# Patient Record
Sex: Male | Born: 1965 | Race: White | Hispanic: No | Marital: Married | Smoking: Never smoker
Health system: Southern US, Community
[De-identification: ages and names within clinical notes are randomized; demographics above are authoritative.]

## PROBLEM LIST (undated history)

## (undated) ENCOUNTER — Emergency Department (HOSPITAL_COMMUNITY): Payer: No Typology Code available for payment source | Source: Home / Self Care

## (undated) DIAGNOSIS — E785 Hyperlipidemia, unspecified: Secondary | ICD-10-CM

## (undated) DIAGNOSIS — I2510 Atherosclerotic heart disease of native coronary artery without angina pectoris: Secondary | ICD-10-CM

## (undated) DIAGNOSIS — Z86010 Personal history of colonic polyps: Secondary | ICD-10-CM

## (undated) DIAGNOSIS — T7840XA Allergy, unspecified, initial encounter: Secondary | ICD-10-CM

## (undated) DIAGNOSIS — K589 Irritable bowel syndrome without diarrhea: Secondary | ICD-10-CM

## (undated) DIAGNOSIS — I639 Cerebral infarction, unspecified: Secondary | ICD-10-CM

## (undated) DIAGNOSIS — Z9861 Coronary angioplasty status: Secondary | ICD-10-CM

## (undated) DIAGNOSIS — I2119 ST elevation (STEMI) myocardial infarction involving other coronary artery of inferior wall: Secondary | ICD-10-CM

## (undated) DIAGNOSIS — J302 Other seasonal allergic rhinitis: Secondary | ICD-10-CM

## (undated) DIAGNOSIS — D649 Anemia, unspecified: Secondary | ICD-10-CM

## (undated) HISTORY — PX: COLONOSCOPY: SHX174

## (undated) HISTORY — DX: Other seasonal allergic rhinitis: J30.2

## (undated) HISTORY — DX: Anemia, unspecified: D64.9

## (undated) HISTORY — DX: Cerebral infarction, unspecified: I63.9

## (undated) HISTORY — PX: COLONOSCOPY W/ POLYPECTOMY: SHX1380

## (undated) HISTORY — PX: POLYPECTOMY: SHX149

## (undated) HISTORY — DX: Personal history of colonic polyps: Z86.010

## (undated) HISTORY — DX: Irritable bowel syndrome, unspecified: K58.9

## (undated) HISTORY — DX: Hyperlipidemia, unspecified: E78.5

## (undated) HISTORY — DX: Allergy, unspecified, initial encounter: T78.40XA

## (undated) HISTORY — PX: LIPOSUCTION: SHX10

---

## 2002-06-26 DIAGNOSIS — Z860100 Personal history of colon polyps, unspecified: Secondary | ICD-10-CM

## 2002-06-26 HISTORY — DX: Personal history of colonic polyps: Z86.010

## 2002-06-26 HISTORY — DX: Personal history of colon polyps, unspecified: Z86.0100

## 2002-08-27 ENCOUNTER — Encounter (INDEPENDENT_AMBULATORY_CARE_PROVIDER_SITE_OTHER): Payer: Self-pay | Admitting: *Deleted

## 2002-08-27 ENCOUNTER — Ambulatory Visit (HOSPITAL_COMMUNITY): Admission: RE | Admit: 2002-08-27 | Discharge: 2002-08-27 | Payer: Self-pay | Admitting: Internal Medicine

## 2005-01-18 ENCOUNTER — Ambulatory Visit: Payer: Self-pay | Admitting: Internal Medicine

## 2005-08-18 ENCOUNTER — Ambulatory Visit: Payer: Self-pay | Admitting: Internal Medicine

## 2005-08-30 ENCOUNTER — Ambulatory Visit: Payer: Self-pay | Admitting: Internal Medicine

## 2007-06-21 ENCOUNTER — Ambulatory Visit: Payer: Self-pay | Admitting: Internal Medicine

## 2010-05-26 HISTORY — PX: NASAL SINUS SURGERY: SHX719

## 2010-09-02 ENCOUNTER — Other Ambulatory Visit: Payer: Self-pay | Admitting: Allergy and Immunology

## 2010-09-02 ENCOUNTER — Ambulatory Visit
Admission: RE | Admit: 2010-09-02 | Discharge: 2010-09-02 | Disposition: A | Discharge status: Home / Self Care | Payer: BC Managed Care – PPO | Source: Ambulatory Visit | Attending: Allergy and Immunology | Admitting: Allergy and Immunology

## 2010-09-02 DIAGNOSIS — J45909 Unspecified asthma, uncomplicated: Secondary | ICD-10-CM

## 2010-09-08 ENCOUNTER — Encounter: Payer: Self-pay | Admitting: Internal Medicine

## 2010-09-13 NOTE — Letter (Signed)
Summary: Colonoscopy Letter  [facility]  [address]   [place], [place] [address]   Phone: [phone]  Fax: [phone]      September 08, 2010 MRN: [id number]   [facility] [address] [place], [place]  [address]   Dear Mr. PORCHE,   According to your medical record, it is time for you to schedule a Colonoscopy. The American Cancer Society recommends this procedure as a method to detect early colon cancer. Patients with a family history of colon cancer, or a personal history of colon polyps or inflammatory bowel disease are at increased risk.  This letter has been generated based on the recommendations made at the time of your procedure. If you feel that in your particular situation this may no longer apply, please contact our office.  Please call our office at [phone] to schedule this appointment or to update your records at your earliest convenience.  Thank you for cooperating with [place] to provide you with the very best care possible.   Sincerely,   Stan Head, M.D.  [facility] [phone]

## 2010-10-07 ENCOUNTER — Encounter: Payer: Self-pay | Admitting: Emergency Medicine

## 2010-10-07 ENCOUNTER — Ambulatory Visit (INDEPENDENT_AMBULATORY_CARE_PROVIDER_SITE_OTHER): Payer: BLUE CROSS/BLUE SHIELD | Admitting: Emergency Medicine

## 2010-10-07 DIAGNOSIS — J45991 Cough variant asthma: Secondary | ICD-10-CM

## 2010-10-07 DIAGNOSIS — J309 Allergic rhinitis, unspecified: Secondary | ICD-10-CM

## 2010-10-07 DIAGNOSIS — J329 Chronic sinusitis, unspecified: Secondary | ICD-10-CM

## 2010-10-07 DIAGNOSIS — J302 Other seasonal allergic rhinitis: Secondary | ICD-10-CM

## 2010-10-07 NOTE — Patient Instructions (Signed)
We will perform full PFT's Please stop your Alvesco for now Continue to have your ProAir available to use as needed Continue your allergy regimen as you are taking it.  Follow with Dr Delton Coombes next available after your PFT's

## 2010-10-07 NOTE — Progress Notes (Signed)
  Subjective:    Patient ID: Cameron Hudson, male    DOB: 1965-09-08, 45 y.o.   MRN: [id number]  HPI 45 yo never smoker, not dx with asthma as a child but freq bronchitis. Suspected asthma based on cough with exercise a long as 20 yrs ago. In the last yr he has had allergy testing and is getting shots, also has had sinus surgery for chronic sinusitis in the last yr. He feels chest tightness, DOE especially with running. Still w yellowish mucous from both sinuses and chest. Hears wheezing occasionally    Review of Systems  Constitutional: Positive for activity change.  HENT: Positive for rhinorrhea, sneezing and postnasal drip.   Respiratory: Positive for cough, chest tightness, shortness of breath and wheezing. Negative for stridor.        Objective:   Physical Exam Gen: Pleasant, well-nourished, in no distress,  normal affect  ENT: No lesions,  mouth clear,  oropharynx clear, no postnasal drip  Neck: No JVD, no stridor  Lungs: No use of accessory muscles, no dullness to percussion, clear without rales or rhonchi  Cardiovascular: RRR, heart sounds normal, no murmur or gallops, no peripheral edema  Musculoskeletal: No deformities, no cyanosis or clubbing  Neuro: alert, non focal  Skin: Warm, no lesions or rashes       Assessment & Plan:  No problem-specific assessment & plan notes found for this encounter.

## 2010-10-09 DIAGNOSIS — J302 Other seasonal allergic rhinitis: Secondary | ICD-10-CM | POA: Insufficient documentation

## 2010-10-09 DIAGNOSIS — J45990 Exercise induced bronchospasm: Secondary | ICD-10-CM | POA: Insufficient documentation

## 2010-10-09 DIAGNOSIS — J329 Chronic sinusitis, unspecified: Secondary | ICD-10-CM | POA: Insufficient documentation

## 2010-10-09 NOTE — Assessment & Plan Note (Signed)
Full PFT Stop Alvesco for now - unclear whether he's benefited  Prn SABA, pretreat exercise

## 2010-10-09 NOTE — Assessment & Plan Note (Signed)
Continue same regimen, immunotherapy Consider adding NSW in the future.

## 2010-10-11 ENCOUNTER — Encounter: Payer: Self-pay | Admitting: Emergency Medicine

## 2010-10-21 ENCOUNTER — Ambulatory Visit (INDEPENDENT_AMBULATORY_CARE_PROVIDER_SITE_OTHER): Payer: BC Managed Care – PPO | Admitting: Emergency Medicine

## 2010-10-21 DIAGNOSIS — J45990 Exercise induced bronchospasm: Secondary | ICD-10-CM

## 2010-10-21 LAB — PULMONARY FUNCTION TEST

## 2010-10-21 NOTE — Progress Notes (Signed)
PFT done today. 

## 2010-10-28 ENCOUNTER — Ambulatory Visit: Payer: BLUE CROSS/BLUE SHIELD | Admitting: Emergency Medicine

## 2010-11-07 ENCOUNTER — Encounter: Payer: Self-pay | Admitting: Emergency Medicine

## 2010-11-08 NOTE — Assessment & Plan Note (Signed)
Cameron Hudson                         GASTROENTEROLOGY OFFICE NOTE   Cameron Hudson, Cameron Hudson                      MRN:          [id number]  DATE:06/21/2007                            DOB:          09-27-1965    CHIEF COMPLAINT:  Hemorrhoids.   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  We prescribed Anusol HC suppositories, which helped.  Sitz baths as  well.  I had advised wet wipes, as well as using the hair dryer.  He  developed a perianal dermatitis and, over the phone, I prescribed  nystatin triamcinolone ointment, which burns some, but he thinks the  rash is getting better, as well as the hemorrhoids.  He has had a little  bit of bleeding from these.  Colonoscopy August 30, 2005 showed no polyps  or cancer.  I saw no active hemorrhoids at that time.  He has had  problems with hemorrhoids intermittently in the past.   PAST MEDICAL HISTORY:  1. Irritable bowel syndrome (this is not active now he tells me).  2. Normal colonoscopy March 2007.  3. Dysplastic nevi.  4. Dyslipidemia.  5. History of microscopic hematuria.   PHYSICAL EXAM:  Well-developed, well-nourished young white man in no  acute distress.  Weight 163 pounds, pulse 64, blood pressure 118/70.  Inspection of the anoderm shows a small amount of erythema and  irritation of the skin.  There are no protruding hemorrhoids.  There is  mild fissuring of the perianal area.  There is no flocculence or mass.  Digital examination reveals normal tone.  No mass.  Anoscopic exam is  performed showing hemorrhoids in the canal that are mildly swollen to  moderately swollen.  There is no fresh blood.   ASSESSMENT:  1. Symptomatic hemorrhoids, improved.  2. Perianal dermatitis.   PLAN:  1. Continue nystatin triamcinolone, though he could switch to      hydrocortisone cream alone.  There is some burning with the      nystatin triamcinolone ointment, but he is getting  better.  2. Anusol HC suppositories p.r.n., not to be use chronically.  3. Because of recurrent problems, he has requested more definitive      treatment if available, and I will refer him to Dr. Claud Kelp      of [facility].     Iva Boop, MD,FACG  Electronically Signed    CEG/MedQ  DD: 06/21/2007  DT: 06/21/2007  Job #: [id number]   cc:   Angelia Mould. Derrell Lolling, M.D.  Gwen Pounds, MD

## 2010-11-14 ENCOUNTER — Encounter: Payer: Self-pay | Admitting: Emergency Medicine

## 2010-11-18 ENCOUNTER — Encounter: Payer: Self-pay | Admitting: Emergency Medicine

## 2010-11-18 ENCOUNTER — Ambulatory Visit (INDEPENDENT_AMBULATORY_CARE_PROVIDER_SITE_OTHER): Payer: BC Managed Care – PPO | Admitting: Emergency Medicine

## 2010-11-18 VITALS — BP 110/82 | HR 81 | Temp 97.8°F | Ht 68.0 in | Wt 163.2 lb

## 2010-11-18 DIAGNOSIS — J45990 Exercise induced bronchospasm: Secondary | ICD-10-CM

## 2010-11-18 NOTE — Assessment & Plan Note (Signed)
PFT's without any AFL or BD response. Still question whether he may have situational (exercise) bronchospasm. Will attempt to assess definitively with methacholine challenge.

## 2010-11-18 NOTE — Patient Instructions (Signed)
We will perform a methacholine challenge We will follow up in 3 -4 weeks to review the results and plan next steps.

## 2010-11-18 NOTE — Progress Notes (Signed)
  Subjective:    Patient ID: Cameron Hudson, male    DOB: [birth year], 45 y.o.   MRN: [id number]  HPI 45 yo never smoker, not dx with asthma as a child but freq bronchitis. Suspected asthma based on cough with exercise a long as 20 yrs ago. In the last yr he has had allergy testing and is getting shots, also has had sinus surgery for chronic sinusitis in the last yr. He feels chest tightness, DOE especially with running. Still w yellowish mucous from both sinuses and chest. Hears wheezing occasionally  ROV 11/18/10 -- Returns to discuss PFT results. Tells me that his breathing is somewhat better, hasn't been able to run without hearing some wheeze. Sinuses probably draining less. Using albuterol pre-exercise. When he starts running he has nasal occlusion and cannot move air thru his nose.    Review of Systems  Constitutional: Positive for activity change.  HENT: Positive for rhinorrhea, sneezing and postnasal drip.   Respiratory: Positive for cough, chest tightness, shortness of breath and wheezing. Negative for stridor.        Objective:   Physical Exam Gen: Pleasant, well-nourished, in no distress,  normal affect  ENT: No lesions,  mouth clear,  oropharynx clear, no postnasal drip  Neck: No JVD, no stridor  Lungs: No use of accessory muscles, no dullness to percussion, clear without rales or rhonchi  Cardiovascular: RRR, heart sounds normal, no murmur or gallops, no peripheral edema  Musculoskeletal: No deformities, no cyanosis or clubbing  Neuro: alert, non focal  Skin: Warm, no lesions or rashes  Assessment & Plan:  No problem-specific assessment & plan notes found for this encounter.

## 2010-12-01 ENCOUNTER — Other Ambulatory Visit: Payer: BLUE CROSS/BLUE SHIELD | Admitting: Internal Medicine

## 2010-12-02 ENCOUNTER — Ambulatory Visit (HOSPITAL_COMMUNITY)
Admission: RE | Admit: 2010-12-02 | Discharge: 2010-12-02 | Disposition: A | Discharge status: Home / Self Care | Payer: BC Managed Care – PPO | Source: Ambulatory Visit | Attending: Emergency Medicine | Admitting: Emergency Medicine

## 2010-12-02 DIAGNOSIS — J45990 Exercise induced bronchospasm: Secondary | ICD-10-CM

## 2010-12-02 DIAGNOSIS — J45909 Unspecified asthma, uncomplicated: Secondary | ICD-10-CM | POA: Insufficient documentation

## 2010-12-23 ENCOUNTER — Ambulatory Visit (AMBULATORY_SURGERY_CENTER): Payer: BC Managed Care – PPO | Admitting: *Deleted

## 2010-12-23 ENCOUNTER — Encounter: Payer: Self-pay | Admitting: Internal Medicine

## 2010-12-23 ENCOUNTER — Telehealth: Payer: Self-pay | Admitting: Emergency Medicine

## 2010-12-23 VITALS — Ht 68.0 in | Wt 159.0 lb

## 2010-12-23 DIAGNOSIS — Z86010 Personal history of colonic polyps: Secondary | ICD-10-CM

## 2010-12-23 MED ORDER — PEG-KCL-NACL-NASULF-NA ASC-C 100 G PO SOLR: ORAL | Status: DC

## 2010-12-23 NOTE — Telephone Encounter (Signed)
I spoke with pt and advised that I will have to forward message Dr. Delton Coombes to see if he has seen the results. Carron Curie, CMA

## 2010-12-23 NOTE — Telephone Encounter (Signed)
LMOMTCB  Per last OV note from 11/18/10, pt was instructed to have methacholine challenge and f/u in 3-4 wks to review results and plan next steps.

## 2010-12-29 NOTE — Telephone Encounter (Signed)
Called, spoke with pt.  He was informed methacholine challenge was normal per RB.  He verbalized understanding of this and would like to know what the next steps should be.  Pls advise.  Thanks!

## 2010-12-29 NOTE — Telephone Encounter (Signed)
Please let the patient know that his methacholine challenge was normal. Thanks

## 2010-12-30 MED ORDER — IPRATROPIUM BROMIDE 0.06 % NA SOLN: 2.0000 | Freq: Three times a day (TID) | NASAL | Status: DC

## 2010-12-30 NOTE — Telephone Encounter (Signed)
No evidence for AFL on methacholine, he feels that the biggest problem is nasal obstruction - will try atrovent NS in addition to his allergy regimen

## 2011-01-05 ENCOUNTER — Telehealth: Payer: Self-pay | Admitting: Specialist

## 2011-01-05 NOTE — Telephone Encounter (Signed)
Pt's questions about clear liquid diet were answered. Cameron Hudson

## 2011-01-06 ENCOUNTER — Encounter: Payer: Self-pay | Admitting: Internal Medicine

## 2011-01-06 ENCOUNTER — Ambulatory Visit (AMBULATORY_SURGERY_CENTER): Payer: BC Managed Care – PPO | Admitting: Internal Medicine

## 2011-01-06 VITALS — BP 134/87 | HR 79 | Temp 96.0°F | Resp 18 | Ht 68.0 in | Wt 151.0 lb

## 2011-01-06 DIAGNOSIS — K5730 Diverticulosis of large intestine without perforation or abscess without bleeding: Secondary | ICD-10-CM

## 2011-01-06 DIAGNOSIS — K648 Other hemorrhoids: Secondary | ICD-10-CM

## 2011-01-06 DIAGNOSIS — Z1211 Encounter for screening for malignant neoplasm of colon: Secondary | ICD-10-CM

## 2011-01-06 DIAGNOSIS — Z86010 Personal history of colonic polyps: Secondary | ICD-10-CM

## 2011-01-06 MED ORDER — SODIUM CHLORIDE 0.9 % IV SOLN: 500.0000 mL | INTRAVENOUS | Status: DC

## 2011-01-06 NOTE — Patient Instructions (Signed)
Mild diverticulosis and Internal hemorrhoids   See green and blue sheets for additional D/C instructions.

## 2011-01-09 ENCOUNTER — Telehealth: Payer: Self-pay | Admitting: *Deleted

## 2011-01-09 NOTE — Telephone Encounter (Signed)

## 2011-04-17 ENCOUNTER — Other Ambulatory Visit: Payer: Self-pay | Admitting: Family Medicine

## 2011-04-17 DIAGNOSIS — M25511 Pain in right shoulder: Secondary | ICD-10-CM

## 2011-04-28 ENCOUNTER — Ambulatory Visit
Admission: RE | Admit: 2011-04-28 | Discharge: 2011-04-28 | Disposition: A | Discharge status: Home / Self Care | Payer: BC Managed Care – PPO | Source: Ambulatory Visit | Attending: Family Medicine | Admitting: Family Medicine

## 2011-04-28 DIAGNOSIS — M25511 Pain in right shoulder: Secondary | ICD-10-CM

## 2011-04-28 MED ORDER — IOHEXOL 180 MG/ML  SOLN
15.0000 mL | Freq: Once | INTRAMUSCULAR | Status: AC | PRN
  Administered 2011-04-28: 15 mL via INTRA_ARTICULAR

## 2012-05-11 DIAGNOSIS — Z8709 Personal history of other diseases of the respiratory system: Secondary | ICD-10-CM | POA: Insufficient documentation

## 2012-05-11 DIAGNOSIS — T7840XA Allergy, unspecified, initial encounter: Secondary | ICD-10-CM | POA: Insufficient documentation

## 2013-07-31 DIAGNOSIS — J302 Other seasonal allergic rhinitis: Secondary | ICD-10-CM | POA: Insufficient documentation

## 2015-02-15 ENCOUNTER — Emergency Department (HOSPITAL_COMMUNITY)
Admission: EM | Admit: 2015-02-15 | Discharge: 2015-02-15 | Disposition: A | Discharge status: Home / Self Care | Payer: BLUE CROSS/BLUE SHIELD | Attending: Emergency Medicine | Admitting: Emergency Medicine

## 2015-02-15 ENCOUNTER — Encounter (HOSPITAL_COMMUNITY): Payer: Self-pay | Admitting: Vascular Surgery

## 2015-02-15 ENCOUNTER — Emergency Department (HOSPITAL_COMMUNITY): Payer: BLUE CROSS/BLUE SHIELD

## 2015-02-15 DIAGNOSIS — E785 Hyperlipidemia, unspecified: Secondary | ICD-10-CM

## 2015-02-15 DIAGNOSIS — Z86010 Personal history of colonic polyps: Secondary | ICD-10-CM | POA: Insufficient documentation

## 2015-02-15 DIAGNOSIS — H81399 Other peripheral vertigo, unspecified ear: Secondary | ICD-10-CM | POA: Diagnosis not present

## 2015-02-15 DIAGNOSIS — J45909 Unspecified asthma, uncomplicated: Secondary | ICD-10-CM | POA: Diagnosis not present

## 2015-02-15 DIAGNOSIS — Z8639 Personal history of other endocrine, nutritional and metabolic disease: Secondary | ICD-10-CM | POA: Insufficient documentation

## 2015-02-15 DIAGNOSIS — R030 Elevated blood-pressure reading, without diagnosis of hypertension: Secondary | ICD-10-CM

## 2015-02-15 DIAGNOSIS — Z79899 Other long term (current) drug therapy: Secondary | ICD-10-CM | POA: Insufficient documentation

## 2015-02-15 DIAGNOSIS — R9431 Abnormal electrocardiogram [ECG] [EKG]: Secondary | ICD-10-CM | POA: Diagnosis not present

## 2015-02-15 DIAGNOSIS — R42 Dizziness and giddiness: Secondary | ICD-10-CM | POA: Diagnosis present

## 2015-02-15 DIAGNOSIS — R0609 Other forms of dyspnea: Secondary | ICD-10-CM

## 2015-02-15 DIAGNOSIS — R110 Nausea: Secondary | ICD-10-CM | POA: Diagnosis not present

## 2015-02-15 DIAGNOSIS — R079 Chest pain, unspecified: Secondary | ICD-10-CM

## 2015-02-15 LAB — CBC
HEMATOCRIT: 46.7 % (ref 39.0–52.0)
Hemoglobin: 16.1 g/dL (ref 13.0–17.0)
MCH: 30.8 pg (ref 26.0–34.0)
MCHC: 34.5 g/dL (ref 30.0–36.0)
MCV: 89.3 fL (ref 78.0–100.0)
PLATELETS: 236 10*3/uL (ref 150–400)
RBC: 5.23 MIL/uL (ref 4.22–5.81)
RDW: 13.5 % (ref 11.5–15.5)
WBC: 5.7 10*3/uL (ref 4.0–10.5)

## 2015-02-15 LAB — BASIC METABOLIC PANEL
Anion gap: 9 (ref 5–15)
BUN: 14 mg/dL (ref 6–20)
CALCIUM: 9.3 mg/dL (ref 8.9–10.3)
CO2: 26 mmol/L (ref 22–32)
CREATININE: 1 mg/dL (ref 0.61–1.24)
Chloride: 104 mmol/L (ref 101–111)
GFR calc Af Amer: >60 mL/min (ref >60)
GLUCOSE: 100 mg/dL — AB (ref 65–99)
Potassium: 3.9 mmol/L (ref 3.5–5.1)
SODIUM: 139 mmol/L (ref 135–145)

## 2015-02-15 LAB — I-STAT TROPONIN, ED: Troponin i, poc: 0 ng/mL (ref 0.00–0.08)

## 2015-02-15 MED ORDER — ONDANSETRON 4 MG PO TBDP: 4.0000 mg | ORAL_TABLET | Freq: Three times a day (TID) | ORAL | Status: DC | PRN

## 2015-02-15 MED ORDER — ASPIRIN 81 MG PO CHEW
324.0000 mg | CHEWABLE_TABLET | Freq: Once | ORAL | Status: AC
  Administered 2015-02-15: 324 mg via ORAL
  Filled 2015-02-15: qty 4

## 2015-02-15 MED ORDER — MECLIZINE HCL 25 MG PO TABS
25.0000 mg | ORAL_TABLET | Freq: Once | ORAL | Status: AC
  Administered 2015-02-15: 25 mg via ORAL
  Filled 2015-02-15: qty 1

## 2015-02-15 MED ORDER — MECLIZINE HCL 25 MG PO TABS: 25.0000 mg | ORAL_TABLET | Freq: Three times a day (TID) | ORAL | Status: DC | PRN

## 2015-02-15 NOTE — ED Notes (Signed)
Pt reports to the ED for eval of dizziness, chest pressure, hypertension, and nausea. Pt reports on Saturday morning he noticed when he awoke he was clammy and dizzy. Pt reports that the clamminess resolved however he continued to have dizziness and nausea. He reports the dizziness is worse with movement. He went to work today and took his BP and he was hypertensive at work. He went to an [facility] and had an EKG performed and he was sent here for possible EKG changes. Pt denies any SOB. Pt A&Ox4, resp e/u, and skin warm and dry.

## 2015-02-15 NOTE — ED Notes (Signed)
MD Miller at the bedside  

## 2015-02-15 NOTE — ED Provider Notes (Signed)
CSN: [id number]     Arrival date & time 02/15/15  1434 History   First MD Initiated Contact with Patient 02/15/15 1750     Chief Complaint  Patient presents with  . Dizziness  . Nausea     (Consider location/radiation/quality/duration/timing/severity/associated sxs/prior Treatment) HPI Comments: The patient is a 49 year old male, he has a history of hypercholesterolemia, currently he is taking no prescription medications for that as he has an allergy to statins. He also has a history of sinus troubles and has had sinus surgery. He has never been diagnosed with hypertension diabetes and is a nonsmoker. He presents from the urgent care where he was found to have an abnormal EKG in the process of a workup for vertigo. He describes 3 days of intermittent dizziness described as the room spinning, this occurs when he moves his head, changes position and goes away when he holds still. He has had some nausea but no diaphoresis or vomiting. No changes in vision, no changes in hearing, no tinnitus. He denies weakness of the arms or the legs. He does report over the last year he has had a progressive decline in his exertional capacity and now states that he becomes short of breath earlier and exercise. He has changed his exercise routine because he has had a decreased exercise tolerance. He reports that he has been having some chest heaviness as though he was wearing a heart rate monitor around his chest. It is that same feeling of a light headiness. He has not seen a cardiologist. He denies swelling of the legs. there is no fevers chills coughing or back pain.  Patient is a 49 y.o. male presenting with dizziness. The history is provided by the patient.  Dizziness   Past Medical History  Diagnosis Date  . Asthma   . Hyperlipidemia   . Seasonal allergies   . Personal history of colonic polyps 2004    12 mm adenoma  . IBS (irritable bowel syndrome)   . Hemorrhoids    Past Surgical History  Procedure  Laterality Date  . Nasal sinus surgery  Dec 2011  . Colonoscopy w/ polypectomy  08/27/2002; 08/30/2005; 01/06/2011    77mm adenoma, diverticulosis, hemorrhoids; normal;  diverticulosis and small hemorrhoids   Family History  Problem Relation Age of Onset  . Allergies Father    Social History  Substance Use Topics  . Smoking status: Never Smoker   . Smokeless tobacco: Never Used  . Alcohol Use: Yes     Comment: 1 to 2 drinks per month    Review of Systems  Neurological: Positive for dizziness.  All other systems reviewed and are negative.     Allergies  Advair diskus; Cheese; Statins; and Sulfa antibiotics  Home Medications   Prior to Admission medications   Medication Sig Start Date End Date Taking? Authorizing Provider  albuterol (PROAIR HFA) 108 (90 BASE) MCG/ACT inhaler Inhale 2 puffs into the lungs every 6 (six) hours as needed for shortness of breath.    Yes Historical Provider, MD  ipratropium (ATROVENT) 0.06 % nasal spray Place 2 sprays into the nose 3 (three) times daily. 12/30/10 12/30/11  Collene Gobble, MD  meclizine (ANTIVERT) 25 MG tablet Take 1 tablet (25 mg total) by mouth 3 (three) times daily as needed for dizziness. 02/15/15   Noemi Chapel, MD  ondansetron (ZOFRAN ODT) 4 MG disintegrating tablet Take 1 tablet (4 mg total) by mouth every 8 (eight) hours as needed for nausea. 02/15/15   Noemi Chapel,  MD   BP 149/89 mmHg  Pulse 66  Temp(Src) 98.5 F (36.9 C) (Oral)  Resp 20  SpO2 98% Physical Exam  Constitutional: He appears well-developed and well-nourished. No distress.  HENT:  Head: Normocephalic and atraumatic.  Mouth/Throat: Oropharynx is clear and moist. No oropharyngeal exudate.  Eyes: Conjunctivae and EOM are normal. Pupils are equal, round, and reactive to light. Right eye exhibits no discharge. Left eye exhibits no discharge. No scleral icterus.  Neck: Normal range of motion. Neck supple. No JVD present. No thyromegaly present.  Cardiovascular: Normal  rate, regular rhythm, normal heart sounds and intact distal pulses.  Exam reveals no gallop and no friction rub.   No murmur heard. Pulmonary/Chest: Effort normal and breath sounds normal. No respiratory distress. He has no wheezes. He has no rales.  Abdominal: Soft. Bowel sounds are normal. He exhibits no distension and no mass. There is no tenderness.  Musculoskeletal: Normal range of motion. He exhibits no edema or tenderness.  Lymphadenopathy:    He has no cervical adenopathy.  Neurological: He is alert. Coordination normal.  Skin: Skin is warm and dry. No rash noted. No erythema.  Psychiatric: He has a normal mood and affect. His behavior is normal.  Nursing note and vitals reviewed.   ED Course  Procedures (including critical care time) Labs Review Labs Reviewed  BASIC METABOLIC PANEL - Abnormal; Notable for the following:    Glucose, Bld 100 (*)    All other components within normal limits  CBC  I-STAT TROPOININ, ED    Imaging Review Dg Chest 2 View  02/15/2015   CLINICAL DATA:  Chest pain for 3 days.  EXAM: CHEST  2 VIEW  COMPARISON:  September 02, 2010.  FINDINGS: The heart size and mediastinal contours are within normal limits. Both lungs are clear. No pneumothorax or pleural effusion is noted. The visualized skeletal structures are unremarkable.  IMPRESSION: No active cardiopulmonary disease.   Electronically Signed   By: Marijo Conception, M.D.   On: 02/15/2015 15:04   I have personally reviewed and evaluated these images and lab results as part of my medical decision-making.   EKG Interpretation   Date/Time:  Monday February 15 2015 14:40:15 EDT Ventricular Rate:  61 PR Interval:  138 QRS Duration: 92 QT Interval:  398 QTC Calculation: 400 R Axis:   90 Text Interpretation:  Normal sinus rhythm Right atrial enlargement  Rightward axis Incomplete right bundle branch block T wave abnormality,  consider inferolateral ischemia Abnormal ECG Confirmed by Jeneen Rinks  MD, [place]   [phone]) on 02/15/2015 2:43:08 PM      MDM   Final diagnoses:  Peripheral vertigo, unspecified laterality  Abnormal EKG    Mr. Deady has significant abnormal T wave changes that will need to be evaluated by a cardiologist. He has a chest x-ray and lab work which are unremarkable, his vital signs show mild hypertension, his vertigo seems to be very mild at this time. It does sound to be a peripheral source and not central. We'll give meclizine, aspirin, consult with cardiology. Initial chest x-ray and labs including a troponin are normal.  D/w Cardiology at 6:25 PM - they will see in consultation   Dr. Mariea Clonts has seen pt and will arrange f/u in the office,  Meclizine, Zofran - pt informed of plan and is in agreement.  I have personally viewed and interpreted the imaging and agree with radiologist interpretation.  Meds given in ED:  Medications  meclizine (ANTIVERT) tablet 25 mg (  25 mg Oral Given 02/15/15 1831)  aspirin chewable tablet 324 mg (324 mg Oral Given 02/15/15 1831)    New Prescriptions   MECLIZINE (ANTIVERT) 25 MG TABLET    Take 1 tablet (25 mg total) by mouth 3 (three) times daily as needed for dizziness.   ONDANSETRON (ZOFRAN ODT) 4 MG DISINTEGRATING TABLET    Take 1 tablet (4 mg total) by mouth every 8 (eight) hours as needed for nausea.        Noemi Chapel, MD 02/15/15 [phone]

## 2015-02-15 NOTE — ED Notes (Signed)
Cardiology at the bedside.

## 2015-02-15 NOTE — Consult Note (Signed)
Patient ID: Cameron Hudson MRN: [id number] DOB/AGE: [birth year] 47 y.o.  Admit date: 02/15/2015 Primary Physician Precious Reel, MD Primary Cardiologist unassigned   Chief Complaint  Vertigo, exertional dyspnea, T wave inversions  HPI: Cameron Hudson is a 49M with HL who was referred to the ED from urgent care with vertigo and T wave inversions on EKG. Cameron Hudson awoke 2 days ago feeling as though the room was spinning. He felt diaphoretic and clammy. The symptoms were not exertional. There is no associated chest pain, shortness of breath, palpitations, orthopnea, PND, or lower extremity edema. He denies any recent viral type illnesses. He checked his BP on a home monitor and it was elevated to 176/96.  Cameron Hudson presented to urgent care for evaluation where he was noted to be hypertensive to 156/103.  While there an EKG was obtained that showed T-wave inversions in the inferior and lateral leads. He also reported to them that he's had decreased exercise tolerance for the last 6-9 months. This has been getting progressively worse. He works out several times per week and is able to get through his activity but feels drained at a point when he does not think he should feel so. He notes mild tightness that seems like a band around his chest. The symptoms occur with exertion and improve when he stops working out. He also occasionally notices lightheadedness or dizziness with exercise.  Cameron Hudson has no family history of premature coronary artery disease. He has never smoked.    Review of Systems:     Cardiac Review of Systems: {Y] = yes [ ]  = no  Chest Pain [  y  ]  Resting SOB [   ] Exertional SOB  [ y ]  Orthopnea [  ]   Pedal Edema [   ]    Palpitations [  ] Syncope  [  ]   Presyncope [   ]  General Review of Systems: [Y] = yes [  ]=no Constitional: recent weight change [  ]; anorexia [  ]; fatigue [  ]; nausea [  ]; night sweats [  ]; fever [  ]; or chills [  ];                                                                      Eyes : blurred vision [  ]; diplopia [   ]; vision changes [  ];  Amaurosis fugax[  ]; Resp: cough Blue.Reese ];  wheezing[ y- improved with allergy shots ];  hemoptysis[  ];  PND [  ];  GI:  gallstones[  ], vomiting[  ];  dysphagia[  ]; melena[  ];  hematochezia [  ]; heartburn[  ];   GU: kidney stones [  ]; hematuria[  ];   dysuria [  ];  nocturia[  ]; incontinence [  ];             Skin: rash, swelling[  ];, hair loss[  ];  peripheral edema[  ];  or itching[  ]; Musculosketetal: myalgias[  ];  joint swelling[  ];  joint erythema[  ];  joint pain[y-neck pain  ];  back pain[  ];  Heme/Lymph: bruising[  ];  bleeding[  ];  anemia[  ];  Neuro: TIA[  ];  headaches[  ];  stroke[  ];  vertigo[  ];  seizures[  ];   paresthesias[  ];  difficulty walking[  ];  Psych:depression[  ]; anxiety[  ];  Endocrine: diabetes[  ];  thyroid dysfunction[  ];  Other:  Past Medical History  Diagnosis Date  . Asthma   . Hyperlipidemia   . Seasonal allergies   . Personal history of colonic polyps 2004    12 mm adenoma  . IBS (irritable bowel syndrome)   . Hemorrhoids      (Not in a hospital admission)      Infusions:    Allergies  Allergen Reactions  . Advair Diskus [Fluticasone-Salmeterol] Other (See Comments)    Increased BP  . Cheese     GI upset   . Statins Other (See Comments)    Muscle pain  . Sulfa Antibiotics Rash    Social History   Social History  . Marital Status: Married    Spouse Name: N/A  . Number of Children: N/A  . Years of Education: N/A   Occupational History  . Orthodontist    Social History Main Topics  . Smoking status: Never Smoker   . Smokeless tobacco: Never Used  . Alcohol Use: Yes     Comment: 1 to 2 drinks per month  . Drug Use: No  . Sexual Activity: Not on file   Other Topics Concern  . Not on file   Social History Narrative    Family History  Problem Relation Age of Onset  . Allergies Father      PHYSICAL EXAM: Filed Vitals:   02/15/15 1845  BP: 149/89  Pulse: 66  Temp:   Resp: 20    No intake or output data in the 24 hours ending 02/15/15 1858  General:  Well appearing. No respiratory difficulty HEENT: normal Neck: supple. no JVD. Carotids 2+ bilat; no bruits. No lymphadenopathy or thryomegaly appreciated. Cor: PMI nondisplaced. Regular rate & rhythm. No rubs, gallops or murmurs. Lungs: clear Abdomen: soft, nontender, nondistended. No hepatosplenomegaly. No bruits or masses. Good bowel sounds. Extremities: no cyanosis, clubbing, rash, edema Neuro: alert & oriented x 3, cranial nerves grossly intact. moves all 4 extremities w/o difficulty. Affect pleasant.  Results for orders placed or performed during the hospital encounter of 02/15/15 (from the past 24 hour(s))  Basic metabolic panel     Status: Abnormal   Collection Time: 02/15/15  2:48 PM  Result Value Ref Range   Sodium 139 135 - 145 mmol/L   Potassium 3.9 3.5 - 5.1 mmol/L   Chloride 104 101 - 111 mmol/L   CO2 26 22 - 32 mmol/L   Glucose, Bld 100 (H) 65 - 99 mg/dL   BUN 14 6 - 20 mg/dL   Creatinine, Ser 1.00 0.61 - 1.24 mg/dL   Calcium 9.3 8.9 - 10.3 mg/dL   GFR calc non Af Amer >60 >60 mL/min   GFR calc Af Amer >60 >60 mL/min   Anion gap 9 5 - 15  CBC     Status: None   Collection Time: 02/15/15  2:48 PM  Result Value Ref Range   WBC 5.7 4.0 - 10.5 K/uL   RBC 5.23 4.22 - 5.81 MIL/uL   Hemoglobin 16.1 13.0 - 17.0 g/dL   HCT 46.7 39.0 - 52.0 %   MCV 89.3 78.0 - 100.0 fL   MCH 30.8 26.0 - 34.0 pg   MCHC 34.5 30.0 -  36.0 g/dL   RDW 13.5 11.5 - 15.5 %   Platelets 236 150 - 400 K/uL  I-stat troponin, ED     Status: None   Collection Time: 02/15/15  2:58 PM  Result Value Ref Range   Troponin i, poc 0.00 0.00 - 0.08 ng/mL   Comment 3           Dg Chest 2 View  02/15/2015   CLINICAL DATA:  Chest pain for 3 days.  EXAM: CHEST  2 VIEW  COMPARISON:  September 02, 2010.  FINDINGS: The heart size and mediastinal  contours are within normal limits. Both lungs are clear. No pneumothorax or pleural effusion is noted. The visualized skeletal structures are unremarkable.  IMPRESSION: No active cardiopulmonary disease.   Electronically Signed   By: Marijo Conception, M.D.   On: 02/15/2015 15:04    ECG: sinus rhythm 61 bpm.  Infero-lateral t wave inversions.  Concerning for ischemia.   ASSESSMENT:  Cameron Hudson is a 6M with HL who was referred to the ED from urgent care with vertigo and T wave inversions on EKG.    PLAN/DISCUSSION:  # Exertional dyspnea, TWI: Cameron Hudson' exertional symptoms and TWI on ECG are concerning for angina and ischemic heart disease.  His main risk factor is hyperlipidemia.  Given that it has been ongoing for months and the vertigo symptoms that brought him to medical care was >24 hours ago, the negative troponin is reassuring and enough for his inpatient evaluation.  Will get an exercise Myoview, as the TWI on the baseline ECG will make a treadmill stress less sensitive. - exercise Myoview as an outpatient - Start ASA 81mg  daily until we get the results of stress  # Elevated blood pressure: Cameron Hudson has a history of borderline elevated BP with sedation for surgical procedures.  BP was elevated yesterday at urgent care and is borderline today.  We discussed that he will likely need to start on an antihypertensive medication.  Will follow up as an outpatient.  # Hyperlipidemia: Medically managed.  Will check lipids as an outpatient.  Will arrange for cardiology follow up within 1 month  Signed: Sharol Harness 02/15/2015, 6:58 PM

## 2015-02-15 NOTE — Discharge Instructions (Signed)
Benign Positional Vertigo Vertigo means you feel like you or your surroundings are moving when they are not. Benign positional vertigo is the most common form of vertigo. Benign means that the cause of your condition is not serious. Benign positional vertigo is more common in older adults. CAUSES  Benign positional vertigo is the result of an upset in the labyrinth system. This is an area in the middle ear that helps control your balance. This may be caused by a viral infection, head injury, or repetitive motion. However, often no specific cause is found. SYMPTOMS  Symptoms of benign positional vertigo occur when you move your head or eyes in different directions. Some of the symptoms may include:  Loss of balance and falls.  Vomiting.  Blurred vision.  Dizziness.  Nausea.  Involuntary eye movements (nystagmus). DIAGNOSIS  Benign positional vertigo is usually diagnosed by physical exam. If the specific cause of your benign positional vertigo is unknown, your caregiver may perform imaging tests, such as magnetic resonance imaging (MRI) or computed tomography (CT). TREATMENT  Your caregiver may recommend movements or procedures to correct the benign positional vertigo. Medicines such as meclizine, benzodiazepines, and medicines for nausea may be used to treat your symptoms. In rare cases, if your symptoms are caused by certain conditions that affect the inner ear, you may need surgery. HOME CARE INSTRUCTIONS   Follow your caregiver's instructions.  Move slowly. Do not make sudden body or head movements.  Avoid driving.  Avoid operating heavy machinery.  Avoid performing any tasks that would be dangerous to you or others during a vertigo episode.  Drink enough fluids to keep your urine clear or pale yellow. SEEK IMMEDIATE MEDICAL CARE IF:   You develop problems with walking, weakness, numbness, or using your arms, hands, or legs.  You have difficulty speaking.  You develop  severe headaches.  Your nausea or vomiting continues or gets worse.  You develop visual changes.  Your family or friends notice any behavioral changes.  Your condition gets worse.  You have a fever.  You develop a stiff neck or sensitivity to light. MAKE SURE YOU:   Understand these instructions.  Will watch your condition.  Will get help right away if you are not doing well or get worse. Document Released: 03/20/2006 Document Revised: 09/04/2011 Document Reviewed: 03/02/2011 Exit[employer] Patient Information 2015 [employer]. This information is not intended to replace advice given to you by your health care provider. Make sure you discuss any questions you have with your health care provider.    

## 2015-02-16 ENCOUNTER — Telehealth: Payer: Self-pay | Admitting: Cardiovascular Disease

## 2015-02-17 ENCOUNTER — Telehealth (HOSPITAL_COMMUNITY): Payer: Self-pay | Admitting: *Deleted

## 2015-02-17 DIAGNOSIS — R9431 Abnormal electrocardiogram [ECG] [EKG]: Secondary | ICD-10-CM

## 2015-02-17 NOTE — Telephone Encounter (Signed)
pts wife called wanting a stress test done on Friday per Dr. Oval Linsey. There isnt an order in epic and all the note says from the er is that a cardiologist will call to set up a stress test. Pts wife insists that a stress test need to be done this week per Dr. Oval Linsey.  Please call wife and advise

## 2015-02-17 NOTE — Telephone Encounter (Signed)
Consult note from Dr. Oval Linsey indicates that she wants the patient to have an exercise Myoview.

## 2015-02-18 NOTE — Telephone Encounter (Signed)
Patient exercise myoview was ordered.

## 2015-02-18 NOTE — Telephone Encounter (Signed)
This is correct.  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

## 2015-02-18 NOTE — Telephone Encounter (Signed)
Close encounter 

## 2015-02-19 ENCOUNTER — Telehealth: Payer: Self-pay | Admitting: *Deleted

## 2015-02-19 ENCOUNTER — Ambulatory Visit (HOSPITAL_COMMUNITY)
Admission: RE | Admit: 2015-02-19 | Discharge: 2015-02-19 | Disposition: A | Discharge status: Home / Self Care | Payer: BLUE CROSS/BLUE SHIELD | Source: Ambulatory Visit | Attending: Cardiology | Admitting: Cardiology

## 2015-02-19 DIAGNOSIS — R9431 Abnormal electrocardiogram [ECG] [EKG]: Secondary | ICD-10-CM | POA: Insufficient documentation

## 2015-02-19 LAB — MYOCARDIAL PERFUSION IMAGING
CHL CUP NUCLEAR SRS: 0
CHL CUP NUCLEAR SSS: 2
CHL CUP RESTING HR STRESS: 61 {beats}/min
CSEPED: 14 min
CSEPEDS: 2 s
Estimated workload: 17.2 METS
LV dias vol: 95 mL
LVSYSVOL: 35 mL
MPHR: 173 {beats}/min
Peak HR: 164 {beats}/min
Percent HR: 94 %
RPE: 15
SDS: 2
TID: 0.94

## 2015-02-19 MED ORDER — TECHNETIUM TC 99M SESTAMIBI GENERIC - CARDIOLITE
32.0000 | Freq: Once | INTRAVENOUS | Status: AC | PRN
  Administered 2015-02-19: 32 via INTRAVENOUS

## 2015-02-19 MED ORDER — TECHNETIUM TC 99M SESTAMIBI GENERIC - CARDIOLITE
10.6000 | Freq: Once | INTRAVENOUS | Status: AC | PRN
  Administered 2015-02-19: 11 via INTRAVENOUS

## 2015-02-19 NOTE — Telephone Encounter (Signed)
OPEN IN ERROR 

## 2015-02-19 NOTE — Telephone Encounter (Signed)
Spoke to patient. Result given . Verbalized understanding  

## 2015-02-19 NOTE — Telephone Encounter (Signed)
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  He can stop taking aspirin.  Please keep follow up appointment so that we can address his blood pressure.

## 2015-03-19 ENCOUNTER — Encounter: Payer: Self-pay | Admitting: Cardiovascular Disease

## 2015-03-19 ENCOUNTER — Ambulatory Visit (INDEPENDENT_AMBULATORY_CARE_PROVIDER_SITE_OTHER): Payer: BLUE CROSS/BLUE SHIELD | Admitting: Cardiovascular Disease

## 2015-03-19 VITALS — BP 108/82 | HR 70 | Ht 69.0 in | Wt 155.4 lb

## 2015-03-19 DIAGNOSIS — E785 Hyperlipidemia, unspecified: Secondary | ICD-10-CM | POA: Diagnosis not present

## 2015-03-19 DIAGNOSIS — Z7189 Other specified counseling: Secondary | ICD-10-CM | POA: Diagnosis not present

## 2015-03-19 NOTE — Progress Notes (Signed)
Cardiology Office Note   Date:  03/19/2015   ID:  CURLIE MACKEN, DOB 1966-03-05, MRN [id number]  PCP:  Precious Reel, MD  Cardiologist:   Sharol Harness, MD   Chief Complaint  Patient presents with  . Follow-up    results myoview, ER for vertigo and had irregular EKG//pt states no Sx.      History of Present Illness: Cameron Hudson is a 49 y.o. male with hyperlipidemia who presents for hospital follow up.  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  A ECG was obtained that showed T wave inversions, so he was referred to the ED.  His BP was elevated both at home, in the ED and at Urgent Care.  He also reported decreased exercise tolerance and a family history of cardiac disease, so a cardiology consult was called.  I evaluated Dr. Doy Mince in the ED and referred him for exercise Myoview, which was normal.    Since being evaluated in the ED he has been doing well.  He denies any episodes of chest pain or shortness of breath. The day after leaving the hospital he visited his chiropractor who did the treatment and he no longer had any more vertigo symptoms. He had seen a chiropractor one day before the symptoms began as well. He also had a recent URI that he thinks may have triggered the vertigo. He has not needed to take any meclizine.  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 He goes to [employer] for one hour and typically burns around 800 cal. He also does shield on the weekend. He denies any chest pain or shortness of breath with this. He has noticed that his heart rate has been decreasing since he started this exercise regimen.   Past Medical History  Diagnosis Date  . Asthma   . Hyperlipidemia   . Seasonal allergies   . Personal history of colonic polyps 2004    12 mm adenoma  . IBS (irritable bowel syndrome)   . Hemorrhoids     Past Surgical History  Procedure Laterality Date  . Nasal sinus surgery  Dec 2011  . Colonoscopy w/  polypectomy  08/27/2002; 08/30/2005; 01/06/2011    57mm adenoma, diverticulosis, hemorrhoids; normal;  diverticulosis and small hemorrhoids     Current Outpatient Prescriptions  Medication Sig Dispense Refill  . Beclomethasone Dipropionate (QNASL) 80 MCG/ACT AERS Place 2 sprays into both nostrils daily.     Current Facility-Administered Medications  Medication Dose Route Frequency Alphonsus Doyel Last Rate Last Dose  . 0.9 %  sodium chloride infusion  500 mL Intravenous Continuous Gatha Mayer, MD        Allergies:   Advair diskus; Cheese; Statins; and Sulfa antibiotics    Social History:  The patient  reports that he has never smoked. He has never used smokeless tobacco. He reports that he drinks alcohol. He reports that he does not use illicit drugs.   Family History:  The patient's family history includes Allergies in his father.    ROS:  Please see the history of present illness.   Otherwise, review of systems are positive for none.   All other systems are reviewed and negative.    PHYSICAL EXAM: VS:  BP 108/82 mmHg  Pulse 70  Ht 5\' 9"  (1.753 m)  Wt 70.489 kg (155 lb 6.4 oz)  BMI 22.94 kg/m2 , BMI Body mass index is 22.94 kg/(m^2). GENERAL:  Well appearing HEENT:  Pupils equal round and reactive, fundi not visualized, oral mucosa unremarkable NECK:  No jugular venous distention, waveform within normal limits, carotid upstroke brisk and symmetric, no bruits, no thyromegaly LYMPHATICS:  No cervical adenopathy LUNGS:  Clear to auscultation bilaterally HEART:  RRR.  PMI not displaced or sustained,S1 and S2 within normal limits, no S3, no S4, no clicks, no rubs, no murmurs ABD:  Flat, positive bowel sounds normal in frequency in pitch, no bruits, no rebound, no guarding, no midline pulsatile mass, no hepatomegaly, no splenomegaly EXT:  2 plus pulses throughout, no edema, no cyanosis no clubbing SKIN:  No rashes no nodules NEURO:  Cranial nerves II through XII grossly intact, motor  grossly intact throughout PSYCH:  Cognitively intact, oriented to person place and time    EKG:  EKG is not ordered today.   Recent Labs: 02/15/2015: BUN 14; Creatinine, Ser 1.00; Hemoglobin 16.1; Platelets 236; Potassium 3.9; Sodium 139    Lipid Panel No results found for: CHOL, TRIG, HDL, CHOLHDL, VLDL, LDLCALC, LDLDIRECT  Cholesterol 212, Triglycerides 170, HDL 39, LDL 139   Wt Readings from Last 3 Encounters:  03/19/15 70.489 kg (155 lb 6.4 oz)  02/19/15 73.936 kg (163 lb)  01/06/11 68.493 kg (151 lb)      Other studies Reviewed: Additional studies/ records that were reviewed today include: outside labs. Review of the above records demonstrates:  Please see elsewhere in the note.     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  Therefore, we will no recommend aspirin or cholesterol lowering medication at this time.  His stress test was negative for ischemia, meaning that despite the inferolateral T wave inversions in the ED, there is no evidence of obstructive coronary disease.  This is especially true given his ability to participate in a rigorous exercise routine several times per week.  # Vertigo: Resolved.  Current medicines are reviewed at length with the patient today.  The patient does not have concerns regarding medicines.  The following changes have been made:  no change  Labs/ tests ordered today include:  No orders of the defined types were placed in this encounter.     Disposition:   FU with Tiffany C. Oval Linsey, MD as needed.    Signed, Sharol Harness, MD  03/19/2015 9:35 AM    [place]

## 2015-03-19 NOTE — Patient Instructions (Signed)
NO CHANGES WITH CURRENT MEDICATIONS  Your physician recommends that you schedule a follow-up appointment AS NEEDED.

## 2015-06-22 ENCOUNTER — Other Ambulatory Visit: Payer: Self-pay | Admitting: Physical Medicine and Rehabilitation

## 2015-06-22 DIAGNOSIS — M5417 Radiculopathy, lumbosacral region: Secondary | ICD-10-CM

## 2015-06-26 ENCOUNTER — Ambulatory Visit
Admission: RE | Admit: 2015-06-26 | Discharge: 2015-06-26 | Disposition: A | Discharge status: Home / Self Care | Payer: BLUE CROSS/BLUE SHIELD | Source: Ambulatory Visit | Attending: Physical Medicine and Rehabilitation | Admitting: Physical Medicine and Rehabilitation

## 2015-06-26 DIAGNOSIS — M5417 Radiculopathy, lumbosacral region: Secondary | ICD-10-CM

## 2015-07-01 ENCOUNTER — Other Ambulatory Visit: Payer: BLUE CROSS/BLUE SHIELD

## 2016-08-09 ENCOUNTER — Encounter: Payer: Self-pay | Admitting: Family Medicine

## 2016-08-09 ENCOUNTER — Ambulatory Visit (INDEPENDENT_AMBULATORY_CARE_PROVIDER_SITE_OTHER): Payer: PRIVATE HEALTH INSURANCE | Admitting: Family Medicine

## 2016-08-09 DIAGNOSIS — M5410 Radiculopathy, site unspecified: Secondary | ICD-10-CM | POA: Diagnosis not present

## 2016-08-09 MED ORDER — PREDNISONE 10 MG PO TABS: ORAL_TABLET | ORAL | 0 refills | Status: DC

## 2016-08-09 NOTE — Progress Notes (Signed)
  Cameron Hudson - 51 y.o. male MRN [id number]  Date of birth: 05-03-1966  SUBJECTIVE:  Including CC & ROS.   Cameron Hudson is a 52 year old male that is presenting with left-sided posterior leg in buttock pain. She reports is been going on for couple of weeks now. He denies any inciting event. He is gotten worse after his flow in for an extended period of time. He tried massage which did not resolve his symptoms. He has some radiation that originates in his buttock in down the posterior aspect of his leg to the back of his knee. He denies any weakness, numbness, or tingling. The act of standing causing his pain to be worse. He denies any history of back injury or surgery. He reports having a history of radicular symptoms but these were mainly in the right leg.  ROS: No unexpected weight loss, fever, chills, swelling, instability, numbness/tingling, redness, otherwise see HPI   HISTORY: Past Medical, Surgical, Social, and Family History Reviewed & Updated per EMR.   Pertinent Historical Findings include: PMSHx -   chronic sinusitis PSHx -  no tobacco use FHx -   allergies  DATA REVIEW: 06/26/15: MRI lumbar spine:1. 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 Moderate left facet arthritis. 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 2. No other significant abnormality.  PHYSICAL EXAM:  VS: BP:136/88  HR:67bpm  TEMP: ( )  RESP:   HT:5\' 9"  (175.3 cm)   WT:153 lb (69.4 kg)  BMI:22.6 PHYSICAL EXAM: Gen: NAD, alert, cooperative with exam, well-appearing HEENT: clear conjunctiva, EOMI CV:  no edema, capillary refill brisk,  Resp: non-labored, normal speech Skin: no rashes, normal turgor  Neuro: no gross deficits.  Psych:  alert and oriented Back:  Normal flexion and extension. No tenderness to palpation over the lumbar spine or SI joints. No tenderness to palpation of the greater trochanter piriformis. Normal strength in lower extremity. Normal knee  flexion and extension. Normal hip flexion. Normal internal and rotation of the hip. Negative FABER and FADIR  Positive straight leg raise on the left. No pain with cross leg back flexion stretch No leg length discrepancy. Normal pelvic rocking   ASSESSMENT & PLAN:   Radicular pain History pain seems to be originating from his back. He does have an MRI from 2016 that demonstrates some foraminal stenosis. This doesn't appear to be related to his SI joints or piriformis.  - prednisone dose pack  - provided home exercises  - f/u in 4 weeks. If no improvement can consider x-ray. Can consider formal PT.

## 2016-08-09 NOTE — Assessment & Plan Note (Addendum)
History pain seems to be originating from his back. He does have an MRI from 2016 that demonstrates some foraminal stenosis. This doesn't appear to be related to his SI joints or piriformis.  - prednisone dose pack  - provided home exercises  - f/u in 4 weeks. If no improvement can consider x-ray. Can consider formal PT.

## 2016-10-06 ENCOUNTER — Ambulatory Visit: Payer: Self-pay

## 2016-10-06 ENCOUNTER — Ambulatory Visit (INDEPENDENT_AMBULATORY_CARE_PROVIDER_SITE_OTHER): Payer: PRIVATE HEALTH INSURANCE | Admitting: Family Medicine

## 2016-10-06 ENCOUNTER — Encounter: Payer: Self-pay | Admitting: Family Medicine

## 2016-10-06 VITALS — BP 129/74 | Ht 69.0 in | Wt 155.0 lb

## 2016-10-06 DIAGNOSIS — M25511 Pain in right shoulder: Secondary | ICD-10-CM

## 2016-10-06 NOTE — Progress Notes (Signed)
  Cameron Hudson - 51 y.o. male MRN [id number]  Date of birth: 1966-04-19  SUBJECTIVE:  Including CC & ROS.  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 He has a history of a right shoulder labral tear that was fixed in 2012. He reports that he believes it was a degenerative tear. He reports pain with his shoulder depressed and when he is internally and externally rotating his shoulder while depressed. He denies any pain with overhead activities. Denies any numbness or tingling in his arm. Does report pain in his trapezius muscle does have some neck pain. Primary concern is that shoulder today.  He is LHD.   ROS: No unexpected weight loss, fever, chills, swelling, instability, muscle pain, numbness/tingling, redness, otherwise see HPI   PMHx - Updated and reviewed.  Contributory factors include: Negative PSHx - Updated and reviewed.  Contributory factors include:  Negative FHx - Updated and reviewed.  Contributory factors include:  Negative Social Hx - Updated and reviewed. Contributory factors include: Negative Medications - reviewed   DATA REVIEWED: MR arthrogram of his right shoulder showing a labral tear  PHYSICAL EXAM:  VS: BP:129/74  HR: bpm  TEMP: ( )  RESP:   HT:5\' 9"  (175.3 cm)   WT:155 lb (70.3 kg)  BMI:22.9 PHYSICAL EXAM: Gen: NAD, alert, cooperative with exam, well-appearing HEENT: clear conjunctiva,  CV:  no edema, capillary refill brisk, normal rate Resp: non-labored Skin: no rashes, normal turgor  Neuro: no gross deficits.  Psych:  alert and oriented  Shoulder: Inspection reveals significant hypertrophy of bilateral trapezius left greater than right and significant hypertrophy of left rhomboids compared to right. Palpation is normal with no tenderness over AC joint or bicipital groove. ROM is full in all planes. Rotator cuff strength normal throughout. Positive signs of  impingement with negative Neer and Hawkin's tests, empty can sign. Speeds and Yergason's tests normal. No labral pathology noted with negative Obrien's, axial loading, negative clunk and good stability. Normal scapular function observed, no winging of scapula. No painful arc and no drop arm sign. No apprehension sign  Ultrasound: Limited ultrasound of right shoulder   Biceps tendon viewed and long and short axis with good fibrillary pattern and no tearing Subscapularis with good fibrillary pattern and no tearing, no impingement under coracoid on external and internal rotation Supraspinatus with good footprint and no tearing seen, reviewed anterior to posterior and transverse Infraspinatus and teres minor without tearing and good fibrillary pattern. Posterior labrum and GH joint without effusion, degeneration, or spurring. AC joint without narrowing or effusion.  Summary: Findings consistent with a normal right shoulder ultrasound.  Ultrasound performed and interpreted by Melton Krebs, DO   ASSESSMENT & PLAN:   Acute pain of right shoulder Likely due to muscle asymmetry.  Do not believe this is a problem with labrum fixed 5-6 years ago.  Recommend massage therapy on a regular basis to help work out muscle spasm.  Normal ultrasound today.  Follow up if needed.

## 2016-10-06 NOTE — Progress Notes (Signed)
[facility]: Attending Note: I have reviewed the chart, discussed wit the Sports Medicine Fellow. I agree with assessment and treatment plan as detailed in the [place] note. He has very prominent left trapezius hypertrophy compared with the right. 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 I really think that this asymmetry is of muscular development is partly was causing his problems. His right shoulder was normal on ultrasound including very good view of the labrum. I also loaded his labrum actively in all planes and did not reproduce his symptoms. See AVS for recommendations. We'll be happy see him back at any time.

## 2016-10-06 NOTE — Patient Instructions (Signed)
Massage: Rivka Barbara (massage therapist Ernestene Mention) scheduler is Cathi at [phone] Do not take insurance  Johnston Ebbs DC, ART

## 2016-10-07 DIAGNOSIS — M25511 Pain in right shoulder: Secondary | ICD-10-CM | POA: Insufficient documentation

## 2016-10-07 NOTE — Assessment & Plan Note (Signed)
Likely due to muscle asymmetry.  Do not believe this is a problem with labrum fixed 5-6 years ago.  Recommend massage therapy on a regular basis to help work out muscle spasm.  Normal ultrasound today.  Follow up if needed.

## 2017-03-27 DIAGNOSIS — M2602 Maxillary hypoplasia: Secondary | ICD-10-CM | POA: Insufficient documentation

## 2017-05-11 ENCOUNTER — Other Ambulatory Visit: Payer: Self-pay | Admitting: Internal Medicine

## 2017-05-11 DIAGNOSIS — R9431 Abnormal electrocardiogram [ECG] [EKG]: Secondary | ICD-10-CM

## 2017-05-11 DIAGNOSIS — E785 Hyperlipidemia, unspecified: Secondary | ICD-10-CM

## 2017-05-25 ENCOUNTER — Ambulatory Visit
Admission: RE | Admit: 2017-05-25 | Discharge: 2017-05-25 | Disposition: A | Discharge status: Home / Self Care | Payer: No Typology Code available for payment source | Source: Ambulatory Visit | Attending: Internal Medicine | Admitting: Internal Medicine

## 2017-05-25 DIAGNOSIS — R9431 Abnormal electrocardiogram [ECG] [EKG]: Secondary | ICD-10-CM

## 2017-05-25 DIAGNOSIS — E785 Hyperlipidemia, unspecified: Secondary | ICD-10-CM

## 2017-06-20 ENCOUNTER — Inpatient Hospital Stay (HOSPITAL_COMMUNITY)
Admission: EM | Admit: 2017-06-20 | Discharge: 2017-06-22 | DRG: 247 | Disposition: A | Discharge status: Home / Self Care | Payer: No Typology Code available for payment source | Source: Ambulatory Visit | Attending: Cardiovascular Disease | Admitting: Cardiovascular Disease

## 2017-06-20 ENCOUNTER — Other Ambulatory Visit: Payer: Self-pay

## 2017-06-20 ENCOUNTER — Encounter (HOSPITAL_COMMUNITY)
Admission: EM | Disposition: A | Discharge status: Home / Self Care | Payer: Self-pay | Source: Ambulatory Visit | Attending: Cardiovascular Disease

## 2017-06-20 ENCOUNTER — Encounter (HOSPITAL_COMMUNITY): Payer: Self-pay | Admitting: Physician Assistant

## 2017-06-20 DIAGNOSIS — Z9861 Coronary angioplasty status: Secondary | ICD-10-CM | POA: Diagnosis not present

## 2017-06-20 DIAGNOSIS — Z8249 Family history of ischemic heart disease and other diseases of the circulatory system: Secondary | ICD-10-CM

## 2017-06-20 DIAGNOSIS — I2510 Atherosclerotic heart disease of native coronary artery without angina pectoris: Secondary | ICD-10-CM | POA: Diagnosis present

## 2017-06-20 DIAGNOSIS — Z882 Allergy status to sulfonamides status: Secondary | ICD-10-CM

## 2017-06-20 DIAGNOSIS — E785 Hyperlipidemia, unspecified: Secondary | ICD-10-CM | POA: Diagnosis present

## 2017-06-20 DIAGNOSIS — R079 Chest pain, unspecified: Secondary | ICD-10-CM | POA: Diagnosis present

## 2017-06-20 DIAGNOSIS — E7800 Pure hypercholesterolemia, unspecified: Secondary | ICD-10-CM | POA: Diagnosis not present

## 2017-06-20 DIAGNOSIS — Z888 Allergy status to other drugs, medicaments and biological substances status: Secondary | ICD-10-CM | POA: Diagnosis not present

## 2017-06-20 DIAGNOSIS — J45909 Unspecified asthma, uncomplicated: Secondary | ICD-10-CM | POA: Diagnosis present

## 2017-06-20 DIAGNOSIS — I2111 ST elevation (STEMI) myocardial infarction involving right coronary artery: Secondary | ICD-10-CM | POA: Insufficient documentation

## 2017-06-20 DIAGNOSIS — Z7951 Long term (current) use of inhaled steroids: Secondary | ICD-10-CM

## 2017-06-20 DIAGNOSIS — I2119 ST elevation (STEMI) myocardial infarction involving other coronary artery of inferior wall: Principal | ICD-10-CM | POA: Diagnosis present

## 2017-06-20 DIAGNOSIS — Z91018 Allergy to other foods: Secondary | ICD-10-CM

## 2017-06-20 DIAGNOSIS — I2584 Coronary atherosclerosis due to calcified coronary lesion: Secondary | ICD-10-CM | POA: Diagnosis present

## 2017-06-20 DIAGNOSIS — Z955 Presence of coronary angioplasty implant and graft: Secondary | ICD-10-CM

## 2017-06-20 DIAGNOSIS — R0989 Other specified symptoms and signs involving the circulatory and respiratory systems: Secondary | ICD-10-CM | POA: Diagnosis present

## 2017-06-20 HISTORY — DX: Coronary angioplasty status: Z98.61

## 2017-06-20 HISTORY — PX: LEFT HEART CATH AND CORONARY ANGIOGRAPHY: CATH118249

## 2017-06-20 HISTORY — DX: Atherosclerotic heart disease of native coronary artery without angina pectoris: I25.10

## 2017-06-20 HISTORY — DX: ST elevation (STEMI) myocardial infarction involving other coronary artery of inferior wall: I21.19

## 2017-06-20 HISTORY — PX: CORONARY STENT INTERVENTION: CATH118234

## 2017-06-20 LAB — PROTIME-INR
INR: 1.01
Prothrombin Time: 13.2 seconds (ref 11.4–15.2)

## 2017-06-20 LAB — CREATININE, SERUM
Creatinine, Ser: 1.02 mg/dL (ref 0.61–1.24)
GFR calc non Af Amer: >60 mL/min (ref >60)

## 2017-06-20 LAB — TROPONIN I
Troponin I: 0.29 ng/mL (ref <0.03)
Troponin I: 11.09 ng/mL (ref <0.03)

## 2017-06-20 LAB — COMPREHENSIVE METABOLIC PANEL
ALT: 21 U/L (ref 17–63)
AST: 36 U/L (ref 15–41)
Albumin: 3.8 g/dL (ref 3.5–5.0)
Alkaline Phosphatase: 66 U/L (ref 38–126)
Anion gap: 10 (ref 5–15)
BUN: 23 mg/dL — AB (ref 6–20)
CHLORIDE: 107 mmol/L (ref 101–111)
CO2: 19 mmol/L — AB (ref 22–32)
Calcium: 8.8 mg/dL — ABNORMAL LOW (ref 8.9–10.3)
Creatinine, Ser: 1.26 mg/dL — ABNORMAL HIGH (ref 0.61–1.24)
GFR calc Af Amer: >60 mL/min (ref >60)
Glucose, Bld: 137 mg/dL — ABNORMAL HIGH (ref 65–99)
POTASSIUM: 3.7 mmol/L (ref 3.5–5.1)
SODIUM: 136 mmol/L (ref 135–145)
Total Bilirubin: 1.1 mg/dL (ref 0.3–1.2)
Total Protein: 6.6 g/dL (ref 6.5–8.1)

## 2017-06-20 LAB — CBC
HCT: 41.8 % (ref 39.0–52.0)
HEMATOCRIT: 44.1 % (ref 39.0–52.0)
Hemoglobin: 15.7 g/dL (ref 13.0–17.0)
Hemoglobin: 14.4 g/dL (ref 13.0–17.0)
MCH: 30.4 pg (ref 26.0–34.0)
MCH: 29.8 pg (ref 26.0–34.0)
MCHC: 35.6 g/dL (ref 30.0–36.0)
MCHC: 34.4 g/dL (ref 30.0–36.0)
MCV: 85.3 fL (ref 78.0–100.0)
MCV: 86.5 fL (ref 78.0–100.0)
PLATELETS: 224 10*3/uL (ref 150–400)
Platelets: 257 10*3/uL (ref 150–400)
RBC: 5.17 MIL/uL (ref 4.22–5.81)
RBC: 4.83 MIL/uL (ref 4.22–5.81)
RDW: 13.2 % (ref 11.5–15.5)
RDW: 13.4 % (ref 11.5–15.5)
WBC: 10.6 10*3/uL — ABNORMAL HIGH (ref 4.0–10.5)
WBC: 9.3 10*3/uL (ref 4.0–10.5)

## 2017-06-20 LAB — POCT ACTIVATED CLOTTING TIME
Activated Clotting Time: 362 seconds
Activated Clotting Time: >1000 seconds

## 2017-06-20 LAB — POCT I-STAT, CHEM 8
BUN: 23 mg/dL — ABNORMAL HIGH (ref 6–20)
CALCIUM ION: 1.09 mmol/L — AB (ref 1.15–1.40)
CREATININE: 1.1 mg/dL (ref 0.61–1.24)
Chloride: 104 mmol/L (ref 101–111)
Glucose, Bld: 141 mg/dL — ABNORMAL HIGH (ref 65–99)
HEMATOCRIT: 46 % (ref 39.0–52.0)
HEMOGLOBIN: 15.6 g/dL (ref 13.0–17.0)
Potassium: 3.8 mmol/L (ref 3.5–5.1)
Sodium: 140 mmol/L (ref 135–145)
TCO2: 21 mmol/L — AB (ref 22–32)

## 2017-06-20 LAB — APTT: aPTT: 26 seconds (ref 24–36)

## 2017-06-20 LAB — LIPID PANEL
CHOL/HDL RATIO: 6.7 ratio
CHOLESTEROL: 220 mg/dL — AB (ref 0–200)
HDL: 33 mg/dL — ABNORMAL LOW (ref >40)
LDL Cholesterol: 158 mg/dL — ABNORMAL HIGH (ref 0–99)
TRIGLYCERIDES: 144 mg/dL (ref <150)
VLDL: 29 mg/dL (ref 0–40)

## 2017-06-20 LAB — HEMOGLOBIN A1C
Hgb A1c MFr Bld: 5.6 % (ref 4.8–5.6)
MEAN PLASMA GLUCOSE: 114.02 mg/dL

## 2017-06-20 LAB — MRSA PCR SCREENING: MRSA by PCR: NEGATIVE

## 2017-06-20 SURGERY — LEFT HEART CATH AND CORONARY ANGIOGRAPHY
Anesthesia: LOCAL

## 2017-06-20 MED ORDER — IOPAMIDOL (ISOVUE-370) INJECTION 76%
INTRAVENOUS | Status: AC
  Filled 2017-06-20: qty 125

## 2017-06-20 MED ORDER — HEPARIN (PORCINE) IN NACL 2-0.9 UNIT/ML-% IJ SOLN
INTRAMUSCULAR | Status: AC
  Filled 2017-06-20: qty 1000

## 2017-06-20 MED ORDER — VERAPAMIL HCL 2.5 MG/ML IV SOLN
INTRAVENOUS | Status: AC
  Filled 2017-06-20: qty 2

## 2017-06-20 MED ORDER — SODIUM CHLORIDE 0.9 % IV SOLN: 250.0000 mL | INTRAVENOUS | Status: DC | PRN

## 2017-06-20 MED ORDER — HEPARIN SODIUM (PORCINE) 1000 UNIT/ML IJ SOLN
INTRAMUSCULAR | Status: DC | PRN
  Administered 2017-06-20: 10000 [IU] via INTRAVENOUS

## 2017-06-20 MED ORDER — SODIUM CHLORIDE 0.9 % IV SOLN
INTRAVENOUS | Status: DC | PRN
  Administered 2017-06-20: 1.75 mg/kg/h via INTRAVENOUS

## 2017-06-20 MED ORDER — SODIUM CHLORIDE 0.9% FLUSH
3.0000 mL | Freq: Two times a day (BID) | INTRAVENOUS | Status: DC
  Administered 2017-06-20 – 2017-06-22 (×4): 3 mL via INTRAVENOUS

## 2017-06-20 MED ORDER — LABETALOL HCL 5 MG/ML IV SOLN: 10.0000 mg | INTRAVENOUS | Status: AC | PRN

## 2017-06-20 MED ORDER — EZETIMIBE 10 MG PO TABS
10.0000 mg | ORAL_TABLET | Freq: Every day | ORAL | Status: DC
  Administered 2017-06-20 – 2017-06-22 (×3): 10 mg via ORAL
  Filled 2017-06-20 (×3): qty 1

## 2017-06-20 MED ORDER — HYDRALAZINE HCL 20 MG/ML IJ SOLN: 5.0000 mg | INTRAMUSCULAR | Status: AC | PRN

## 2017-06-20 MED ORDER — HEPARIN SODIUM (PORCINE) 1000 UNIT/ML IJ SOLN
INTRAMUSCULAR | Status: AC
  Filled 2017-06-20: qty 1

## 2017-06-20 MED ORDER — MIDAZOLAM HCL 2 MG/2ML IJ SOLN
INTRAMUSCULAR | Status: DC | PRN
  Administered 2017-06-20: 2 mg via INTRAVENOUS

## 2017-06-20 MED ORDER — NITROGLYCERIN 1 MG/10 ML FOR IR/CATH LAB
INTRA_ARTERIAL | Status: DC | PRN
  Administered 2017-06-20: 200 ug via INTRACORONARY

## 2017-06-20 MED ORDER — BIVALIRUDIN BOLUS VIA INFUSION - CUPID
INTRAVENOUS | Status: DC | PRN
  Administered 2017-06-20: 52.5 mg via INTRAVENOUS

## 2017-06-20 MED ORDER — TICAGRELOR 90 MG PO TABS
90.0000 mg | ORAL_TABLET | Freq: Two times a day (BID) | ORAL | Status: DC
  Administered 2017-06-20 – 2017-06-22 (×4): 90 mg via ORAL
  Filled 2017-06-20 (×4): qty 1

## 2017-06-20 MED ORDER — TICAGRELOR 90 MG PO TABS
ORAL_TABLET | ORAL | Status: DC | PRN
  Administered 2017-06-20: 180 mg via ORAL

## 2017-06-20 MED ORDER — FENTANYL CITRATE (PF) 100 MCG/2ML IJ SOLN
INTRAMUSCULAR | Status: AC
  Filled 2017-06-20: qty 2

## 2017-06-20 MED ORDER — NITROGLYCERIN 1 MG/10 ML FOR IR/CATH LAB
INTRA_ARTERIAL | Status: AC
  Filled 2017-06-20: qty 10

## 2017-06-20 MED ORDER — IOPAMIDOL (ISOVUE-370) INJECTION 76%
INTRAVENOUS | Status: DC | PRN
  Administered 2017-06-20: 175 mg via INTRAVENOUS

## 2017-06-20 MED ORDER — LIDOCAINE HCL (PF) 1 % IJ SOLN
INTRAMUSCULAR | Status: AC
  Filled 2017-06-20: qty 30

## 2017-06-20 MED ORDER — METOPROLOL TARTRATE 12.5 MG HALF TABLET
12.5000 mg | ORAL_TABLET | Freq: Two times a day (BID) | ORAL | Status: DC
  Administered 2017-06-20 – 2017-06-22 (×5): 12.5 mg via ORAL
  Filled 2017-06-20 (×5): qty 1

## 2017-06-20 MED ORDER — BIVALIRUDIN TRIFLUOROACETATE 250 MG IV SOLR
INTRAVENOUS | Status: AC
  Filled 2017-06-20: qty 250

## 2017-06-20 MED ORDER — SODIUM CHLORIDE 0.9 % IV SOLN
INTRAVENOUS | Status: DC
  Administered 2017-06-20: 13:00:00 via INTRAVENOUS

## 2017-06-20 MED ORDER — HEPARIN (PORCINE) IN NACL 2-0.9 UNIT/ML-% IJ SOLN
INTRAMUSCULAR | Status: AC | PRN
  Administered 2017-06-20: 1000 mL

## 2017-06-20 MED ORDER — ONDANSETRON HCL 4 MG/2ML IJ SOLN: 4.0000 mg | Freq: Four times a day (QID) | INTRAMUSCULAR | Status: DC | PRN

## 2017-06-20 MED ORDER — ACETAMINOPHEN 325 MG PO TABS: 650.0000 mg | ORAL_TABLET | ORAL | Status: DC | PRN

## 2017-06-20 MED ORDER — VERAPAMIL HCL 2.5 MG/ML IV SOLN
INTRAVENOUS | Status: DC | PRN
  Administered 2017-06-20: 12:00:00 via INTRA_ARTERIAL

## 2017-06-20 MED ORDER — HEPARIN SODIUM (PORCINE) 5000 UNIT/ML IJ SOLN
5000.0000 [IU] | Freq: Three times a day (TID) | INTRAMUSCULAR | Status: DC
  Administered 2017-06-20 – 2017-06-22 (×4): 5000 [IU] via SUBCUTANEOUS
  Filled 2017-06-20 (×4): qty 1

## 2017-06-20 MED ORDER — TICAGRELOR 90 MG PO TABS
ORAL_TABLET | ORAL | Status: AC
  Filled 2017-06-20: qty 2

## 2017-06-20 MED ORDER — LIDOCAINE HCL (PF) 1 % IJ SOLN
INTRAMUSCULAR | Status: DC | PRN
  Administered 2017-06-20: 2 mL

## 2017-06-20 MED ORDER — SODIUM CHLORIDE 0.9% FLUSH: 3.0000 mL | INTRAVENOUS | Status: DC | PRN

## 2017-06-20 MED ORDER — FENTANYL CITRATE (PF) 100 MCG/2ML IJ SOLN
INTRAMUSCULAR | Status: DC | PRN
  Administered 2017-06-20: 50 ug via INTRAVENOUS

## 2017-06-20 MED ORDER — SODIUM CHLORIDE 0.9 % IV SOLN
INTRAVENOUS | Status: AC | PRN
  Administered 2017-06-20: 70 mL via INTRAVENOUS

## 2017-06-20 MED ORDER — ZOLPIDEM TARTRATE 5 MG PO TABS: 5.0000 mg | ORAL_TABLET | Freq: Every evening | ORAL | Status: DC | PRN

## 2017-06-20 MED ORDER — ASPIRIN 81 MG PO CHEW
81.0000 mg | CHEWABLE_TABLET | Freq: Every day | ORAL | Status: DC
  Administered 2017-06-21 – 2017-06-22 (×2): 81 mg via ORAL
  Filled 2017-06-20 (×2): qty 1

## 2017-06-20 MED ORDER — IOPAMIDOL (ISOVUE-370) INJECTION 76%
INTRAVENOUS | Status: AC
  Filled 2017-06-20: qty 100

## 2017-06-20 MED ORDER — DIAZEPAM 5 MG PO TABS: 5.0000 mg | ORAL_TABLET | Freq: Three times a day (TID) | ORAL | Status: DC | PRN

## 2017-06-20 MED ORDER — MIDAZOLAM HCL 2 MG/2ML IJ SOLN
INTRAMUSCULAR | Status: AC
  Filled 2017-06-20: qty 2

## 2017-06-20 SURGICAL SUPPLY — 19 items
BALLN SAPPHIRE 2.5X12 (BALLOONS) ×2
BALLN SAPPHIRE ~~LOC~~ 3.25X18 (BALLOONS) ×1 IMPLANT
BALLOON SAPPHIRE 2.5X12 (BALLOONS) IMPLANT
CATH INFINITI 5FR ANG PIGTAIL (CATHETERS) ×1 IMPLANT
CATH OPTITORQUE TIG 4.0 5F (CATHETERS) ×1 IMPLANT
CATH VISTA GUIDE 6FR JR4 (CATHETERS) ×1 IMPLANT
DEVICE RAD COMP TR BAND LRG (VASCULAR PRODUCTS) ×1 IMPLANT
ELECT DEFIB PAD ADLT CADENCE (PAD) ×1 IMPLANT
GLIDESHEATH SLEND SS 6F .021 (SHEATH) ×1 IMPLANT
GUIDEWIRE INQWIRE 1.5J.035X260 (WIRE) IMPLANT
INQWIRE 1.5J .035X260CM (WIRE) ×2
KIT ENCORE 26 ADVANTAGE (KITS) ×1 IMPLANT
KIT HEART LEFT (KITS) ×2 IMPLANT
PACK CARDIAC CATHETERIZATION (CUSTOM PROCEDURE TRAY) ×2 IMPLANT
STENT SYNERGY DES 3X24 (Permanent Stent) ×1 IMPLANT
SYR MEDRAD MARK V 150ML (SYRINGE) ×2 IMPLANT
TRANSDUCER W/STOPCOCK (MISCELLANEOUS) ×2 IMPLANT
TUBING CIL FLEX 10 FLL-RA (TUBING) ×2 IMPLANT
WIRE PT2 MS 185 (WIRE) ×1 IMPLANT

## 2017-06-20 NOTE — H&P (Signed)
History & Physical    Patient ID: Cameron Hudson MRN: [id number], DOB/AGE: 1966-03-29   Admit date: 06/20/2017   Primary Physician: Shon Baton, MD Primary Cardiologist: Oval Linsey42)   Patient Profile    51 yo male with PMH of HL who presented with chest pain and called a STEMI in the field.  Past Medical History   Past Medical History:  Diagnosis Date  . Asthma   . Hemorrhoids   . Hyperlipidemia   . IBS (irritable bowel syndrome)   . Personal history of colonic polyps 2004   12 mm adenoma  . Seasonal allergies   . STEMI involving oth coronary artery of inferior wall ([place]) 06/20/2017    Past Surgical History:  Procedure Laterality Date  . COLONOSCOPY W/ POLYPECTOMY  08/27/2002; 08/30/2005; 01/06/2011   41mm adenoma, diverticulosis, hemorrhoids; normal;  diverticulosis and small hemorrhoids  . NASAL SINUS SURGERY  Dec 2011     Allergies  Allergies  Allergen Reactions  . Advair Diskus [Fluticasone-Salmeterol] Other (See Comments)    Increased BP  . Cheese     GI upset   . Statins Other (See Comments)    Muscle pain  . Sulfa Antibiotics Rash    History of Present Illness    Cameron Hudson is a 51 yo male with PMH of HL. Reports he is followed by his PCP on a regular basis. Saw Dr. Oval Linsey back in 2016 and underwent exercise myoview that was normal. Does report family hx of CAD with his grandfather, but unsure able his father. Recently had a Coronary CT with calcium score of 136, with calcifications in the LAD, RCA, and LCx. Reports he has been in his usual state of health until today. Reports he went to the gym today and did his normal work out. Then was at [place] around 945am when he developed on set of chest pain and shortness of breath. EMS was called. EKG showed inferior ST elevation. Code STEMI was called in the field. Given 324mg  ASA, and zofran prior to arrival. He was brought directly to the cath lab for emergent cath.   Home Medications    Prior to  Admission medications   Medication Sig Start Date End Date Taking? Authorizing Provider  Beclomethasone Dipropionate (QNASL) 80 MCG/ACT AERS Place 2 sprays into both nostrils daily.    [provider]  Beclomethasone Dipropionate 80 MCG/ACT AERS 2 sprays by Nasal route daily.    [provider]  cetirizine (ZYRTEC) 10 MG tablet Take by mouth.    [provider]  EPINEPHrine 0.3 mg/0.3 mL IJ SOAJ injection See admin instructions. 07/19/16   [provider]    Family History    Family History  Problem Relation Age of Onset  . Allergies Father     Social History    Social History   Socioeconomic History  . Marital status: Married    Spouse name: Not on file  . Number of children: Not on file  . Years of education: Not on file  . Highest education level: Not on file  Social Needs  . Financial resource strain: Not on file  . Food insecurity - worry: Not on file  . Food insecurity - inability: Not on file  . Transportation needs - medical: Not on file  . Transportation needs - non-medical: Not on file  Occupational History  . Occupation: Orthodontist  Tobacco Use  . Smoking status: Never Smoker  . Smokeless tobacco: Never Used  Substance and Sexual Activity  .  Alcohol use: Yes    Comment: 1 to 2 drinks per month  . Drug use: No  . Sexual activity: Not on file  Other Topics Concern  . Not on file  Social History Narrative  . Not on file     Review of Systems    See HPI  All other systems reviewed and are otherwise negative except as noted above.  Physical Exam    SpO2 100 %.  General: WM, diaphoretic, pale, NAD Psych: Normal affect. Neuro: Alert and oriented X 3. Moves all extremities spontaneously. HEENT: Normal  Neck: Supple without bruits or JVD. Lungs:  Resp regular and unlabored, CTA. Heart: RRR no s3, s4, or murmurs. Abdomen: Soft, non-tender, non-distended, BS + x 4.  Extremities: No clubbing, cyanosis or edema.  DP/PT/Radials 2+ and equal bilaterally.  Labs    Troponin (Point of Care Test) No results for input(s): TROPIPOC in the last 72 hours. No results for input(s): CKTOTAL, CKMB, TROPONINI in the last 72 hours. Lab Results  Component Value Date   WBC 5.7 02/15/2015   HGB 16.1 02/15/2015   HCT 46.7 02/15/2015   MCV 89.3 02/15/2015   PLT 236 02/15/2015   No results for input(s): NA, K, CL, CO2, BUN, CREATININE, CALCIUM, PROT, BILITOT, ALKPHOS, ALT, AST, GLUCOSE in the last 168 hours.  Invalid input(s): LABALBU No results found for: CHOL, HDL, LDLCALC, TRIG No results found for: [facility]   Radiology Studies    Ct Cardiac Scoring  Result Date: 05/25/2017 CLINICAL DATA:  High cholesterol EXAM: CT HEART FOR CALCIUM SCORING TECHNIQUE: CT heart was performed on a 64 channel system using prospective ECG gating. A non-contrast exam for calcium scoring was performed. Note that this exam targets the heart and the chest was not imaged in its entirety. COMPARISON:  None. FINDINGS: Technical quality: Good. CORONARY CALCIUM Total Agatston Score: 136 with calcifications noted most pronounced in the left anterior descending coronary artery and right coronary artery, also noted in left circumflex coronary artery. MESA database percentile:  10 OTHER FINDINGS: Cardiovascular: Heart is normal size. Visualized aorta normal caliber. Mediastinum/Nodes: No adenopathy in the lower mediastinum or hila. Lungs/Pleura: Visualized lungs clear.  No effusions. Upper Abdomen: Imaging into the upper abdomen shows no acute findings. Small low-density lesion within the lateral segment of the left hepatic lobe measures 10 mm, likely small cyst. Musculoskeletal: Chest wall soft tissues are unremarkable. No acute bony abnormality. IMPRESSION: Total coronary calcium score of 136 with calcifications in the left anterior descending, right coronary, and left circumflex coronary artery. This is 92nd percentile based on age and gender related  control group. No acute or significant extracardiac abnormality. Electronically Signed   By: Rolm Baptise M.D.   On: 05/25/2017 10:21    ECG & Cardiac Imaging    EKG: SR with acute ST elevation  Assessment & Plan    51 yo male with PMH of HL who presented with chest pain and called a STEMI in the field.  Acute inferior STEMI: Developed chest pain around 0945am with shortness of breath and nausea. Given 324mg  ASA and zofran via EMS. Brought directly to the cath lab for emergent cardiac cath. Further recommendations to follow post cath.   Barnet Pall, NP-C Pager [phone] 06/20/2017, 11:32 AM    Patient seen and examined. Agree with assessment and plan.  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  He also has  a history of blood pressure lability.  He admits to working out at least 4-5 days per week and has been doing Engineer, manufacturing systems. .  He has noticed stones of some mild chest tightness.  Reportedly 2 years ago he underwent a nuclear stress test.  He recently had a CT coronary angiogram which showed coronary calcification and his calcium score was 136.  His morning.  He did a Media planner workout at the gym.  At approximately 9:45 AM after working out.  He developed acute onset chest tightness and pressure.  He was brought by EMS in a code STEMI was activated with inferior ST elevation.  Upon presenting to the catheterization laboratory.  He was still having back discomfort 7/10 , with ST elevation inferolaterally.  I discussed emergent cardiac catheterization and a high likelihood of total RCA occlusion.  Plan emergent cardiac catheterization.   Troy Sine, MD, [facility] 06/20/2017 12:47 PM

## 2017-06-20 NOTE — Progress Notes (Signed)
CRITICAL VALUE ALERT  Critical Value: 11.09   Date & Time Notied: 06/20/17 1830  Provider Notified: None. Expected result due to Cath Procedure and STEMI  Orders Received/Actions taken: None.

## 2017-06-20 NOTE — Progress Notes (Signed)
   06/20/17 1100  Clinical Encounter Type  Visited With Patient;Health care provider  Visit Type ED  Referral From Nurse  Consult/Referral To Chaplain   Responded to a Code Stemi for this patient.  Patient was taken straight to the Cath Lab.  In walking with the medical team he let me know his wife was on the way.  I alerted the ED desk that wife was on the way and if they needed any support to please page me.  Will follow as needed. Chaplain Katherene Ponto

## 2017-06-20 NOTE — Progress Notes (Signed)
CRITICAL VALUE ALERT  Critical Value:  Troponin 0.29  Date & Time Notied: 06/20/17 1315  Provider Notified:None. Expected result. STEMI  Orders Received/Actions taken: None. STEMI Procedure Preformed

## 2017-06-21 ENCOUNTER — Encounter (HOSPITAL_COMMUNITY): Payer: Self-pay | Admitting: Cardiovascular Disease

## 2017-06-21 DIAGNOSIS — E7800 Pure hypercholesterolemia, unspecified: Secondary | ICD-10-CM

## 2017-06-21 LAB — BASIC METABOLIC PANEL
ANION GAP: 9 (ref 5–15)
BUN: 14 mg/dL (ref 6–20)
CHLORIDE: 106 mmol/L (ref 101–111)
CO2: 22 mmol/L (ref 22–32)
Calcium: 8.1 mg/dL — ABNORMAL LOW (ref 8.9–10.3)
Creatinine, Ser: 0.98 mg/dL (ref 0.61–1.24)
GFR calc non Af Amer: >60 mL/min (ref >60)
GLUCOSE: 115 mg/dL — AB (ref 65–99)
POTASSIUM: 3.9 mmol/L (ref 3.5–5.1)
Sodium: 137 mmol/L (ref 135–145)

## 2017-06-21 LAB — GLUCOSE, CAPILLARY: GLUCOSE-CAPILLARY: 100 mg/dL — AB (ref 65–99)

## 2017-06-21 LAB — CBC
HEMATOCRIT: 40.7 % (ref 39.0–52.0)
HEMOGLOBIN: 13.9 g/dL (ref 13.0–17.0)
MCH: 29.7 pg (ref 26.0–34.0)
MCHC: 34.2 g/dL (ref 30.0–36.0)
MCV: 87 fL (ref 78.0–100.0)
Platelets: 206 10*3/uL (ref 150–400)
RBC: 4.68 MIL/uL (ref 4.22–5.81)
RDW: 13.6 % (ref 11.5–15.5)
WBC: 8.3 10*3/uL (ref 4.0–10.5)

## 2017-06-21 NOTE — Plan of Care (Signed)
Patient is very proactive in his recovery. He is up at will ambulating in the hall as well as being up to the chair and moving about his room. He is very aware of the proper diet and participates daily in exercise. His general knowledge as well as that of specific disease process knowledge is improving, with many questions being asked. His wife and family are very supportive as well.

## 2017-06-21 NOTE — Care Management Note (Addendum)
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 -  Provided pt with 30 day free card for Brilinta and pt assistance application to f/u on for possible further assistance-(not sure if pt will qualify due to income) CM assessed possible other new meds- most likely metoprolol- $4 med- and pt to start Zetia  which pt plans to pay out of pocket for. CM will follow for any further assistance needed for medications- pt and wife very knowledgeable on discount med. Apps. And how to find cheapest pharmacy.   Expected Discharge Date:                  Expected Discharge Plan:  Home/Self Care  In-House Referral:     Discharge planning Services  CM Consult, Medication Assistance  Post Acute Care Choice:    Choice offered to:     DME Arranged:    DME Agency:     HH Arranged:    HH Agency:     Status of Service:  In process, will continue to follow  If discussed at Long Length of Stay Meetings, dates discussed:    Discharge Disposition: home/self care   Additional Comments:  06/22/17- 1120- Flem Enderle RN, CM- pt for d/c home- d/c meds reviewed- no further assistance noted from discussion with pt and wife on 06/21/17.   Dawayne Patricia, RN 06/21/2017, 4:43 PM

## 2017-06-21 NOTE — Progress Notes (Signed)
Patient very independent. Placed on portable telemetry box for monitoring.

## 2017-06-21 NOTE — Progress Notes (Signed)
[id number] Observed pt walking independently with steady gait and no CP. MI education completed with pt who voiced understanding. Stressed importance of brilinta with stent. Case manager in to see pt and is to return. Reviewed NTG use, risk factors, ex ed and heart healthy diet. Discussed CRP 2 and will refer to [place] program. Pt has been following heart healthy diet. Graylon Good RN BSN 06/21/2017 2:11 PM

## 2017-06-21 NOTE — Progress Notes (Signed)
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 Claiborne Billings.  Peak troponin was relatively low.  His LV function was normal.  He denies chest pain or shortness of breath.  Inpatient Medications    Scheduled Meds: . aspirin  81 mg Oral Daily  . ezetimibe  10 mg Oral Daily  . heparin  5,000 Units Subcutaneous Q8H  . metoprolol tartrate  12.5 mg Oral BID  . sodium chloride flush  3 mL Intravenous Q12H  . ticagrelor  90 mg Oral BID   Continuous Infusions: . sodium chloride Stopped (06/21/17 0129)  . sodium chloride     PRN Meds: sodium chloride, acetaminophen, diazepam, ondansetron (ZOFRAN) IV, sodium chloride flush, zolpidem   Vital Signs    Vitals:   06/21/17 0419 06/21/17 0500 06/21/17 0600 06/21/17 0700  BP:  129/81 (!) 134/91 137/90  Pulse: 72 72 64 65  Resp: 11 20 18 13   Temp: 98.2 F (36.8 C)     TempSrc:      SpO2: 98% 98% 98% 97%  Weight:      Height:        Intake/Output Summary (Last 24 hours) at 06/21/2017 0800 Last data filed at 06/21/2017 0500 Gross per 24 hour  Intake 2735.42 ml  Output 650 ml  Net 2085.42 ml   Filed Weights   06/20/17 1300  Weight: 152 lb (68.9 kg)    Telemetry    Sinus rhythm with occasional PVCs- Personally Reviewed  ECG    Normal sinus rhythm at 61 with inferolateral T wave inversion- Personally Reviewed  Physical Exam   GEN: No acute distress.   Neck: No JVD Cardiac: RRR, no murmurs, rubs, or gallops.  Respiratory: Clear to auscultation bilaterally. GI: Soft, nontender, non-distended  MS: No edema; No deformity. Neuro:  Nonfocal  Psych: Normal affect  Extremity: Right radial puncture site intact  Labs    Chemistry Recent Labs  Lab  06/20/17 1124 06/20/17 1140 06/20/17 1650 06/21/17 0222  NA 136 140  --  137  K 3.7 3.8  --  3.9  CL 107 104  --  106  CO2 19*  --   --  22  GLUCOSE 137* 141*  --  115*  BUN 23* 23*  --  14  CREATININE 1.26* 1.10 1.02 0.98  CALCIUM 8.8*  --   --  8.1*  PROT 6.6  --   --   --   ALBUMIN 3.8  --   --   --   AST 36  --   --   --   ALT 21  --   --   --   ALKPHOS 66  --   --   --   BILITOT 1.1  --   --   --   GFRNONAA >60  --  >60 >60  GFRAA >60  --  >60 >60  ANIONGAP 10  --   --  9     Hematology Recent Labs  Lab 06/20/17 1124 06/20/17 1140 06/20/17 1650 06/21/17 0222  WBC 10.6*  --  9.3 8.3  RBC 5.17  --  4.83 4.68  HGB 15.7 15.6 14.4 13.9  HCT 44.1 46.0 41.8 40.7  MCV 85.3  --  86.5  87.0  MCH 30.4  --  29.8 29.7  MCHC 35.6  --  34.4 34.2  RDW 13.2  --  13.4 13.6  PLT 257  --  224 206    Cardiac Enzymes Recent Labs  Lab 06/20/17 1124 06/20/17 1650  TROPONINI 0.29* 11.09*   No results for input(s): TROPIPOC in the last 168 hours.   BNPNo results for input(s): BNP, PROBNP in the last 168 hours.   DDimer No results for input(s): DDIMER in the last 168 hours.   Radiology    No results found.  Cardiac Studies   Cardiac catheterization (06/20/17)  Conclusion     Mid LAD lesion is 10% stenosed.  Prox Cx lesion is 30% stenosed.  Mid Cx lesion is 20% stenosed.  Prox RCA lesion is 20% stenosed.  Mid RCA lesion is 20% stenosed.  Dist RCA lesion is 100% stenosed.  A drug-eluting stent was successfully placed using a STENT SYNERGY DES 3X24.  Post intervention, there is a 0% residual stenosis.   Acute inferior ST segment elevation myocardial infarction secondary to total occlusion of a very large dominant RCA immediately beyond the acute margin.  Mild nonobstructive concomitant CAD with 10% narrowing in the mid LAD; 30% proximal circumflex stenosis with 20% mid AV groove stenosis; and a large dominant RCA with 20% proximal narrowing, 20% narrowing  before the acute margin, and complete total occlusion with TIMI 0 flow to a large dominant vessel.  There are collaterals from the LAD to the PLA.  Successful PCI with to the RCA with ultimate insertion of a 3.024 mm Synergy DES stent postdilated to 3.29 mm with the percent occlusion reduced to 0% and with resumption of brisk TIMI-3 flow.  The distal RCA supplied very large PDA and PLA vessels which supply the entire posterior wall to the apex.  RECOMMENDATION: DAPT for minimum of 1 year.  The patient will be started on low-dose metoprolol post MI.  He has a history of statin-induced rhabdomyolysis.  Will add Zetia.  However, with his concomitant CAD, recommend consideration for PCSK9 inhibition for optimal treatment with aggressive LDL lowering to induce plaque regression.     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  His troponins peaked at 11.  He is asymptomatic.  Other problems include hyperlipidemia intolerant to statin therapy.  He had a couple PVCs on telemetry.  Plan will be to transfer to telemetry with potential discharge in the morning.  Assessment & Plan    1: Coronary artery disease-postop day 1 inferior STEMI with peak troponin of 11.  He is on aspirin and Brilinta.  Will start low-dose beta-blocker.  Transfer to telemetry today.  2: Hyperlipidemia-statin intolerant.  On Zetia.  Consider starting PC SK 9  Patient be transferred to telemetry today.  He can be "fast track" with anticipation of discharge tomorrow.  For questions or updates, please contact [place] Please consult [email or web address] for contact info under Cardiology/STEMI.      Signed, Quay Burow, MD  06/21/2017, 8:00 AM

## 2017-06-22 ENCOUNTER — Encounter (HOSPITAL_COMMUNITY): Payer: Self-pay | Admitting: Cardiology

## 2017-06-22 DIAGNOSIS — Z9861 Coronary angioplasty status: Secondary | ICD-10-CM

## 2017-06-22 DIAGNOSIS — E785 Hyperlipidemia, unspecified: Secondary | ICD-10-CM

## 2017-06-22 MED ORDER — EZETIMIBE 10 MG PO TABS: 10.0000 mg | ORAL_TABLET | Freq: Every day | ORAL | 3 refills | Status: DC

## 2017-06-22 MED ORDER — METOPROLOL TARTRATE 25 MG PO TABS: 12.5000 mg | ORAL_TABLET | Freq: Two times a day (BID) | ORAL | 2 refills | Status: DC

## 2017-06-22 MED ORDER — NITROGLYCERIN 0.4 MG SL SUBL: 0.4000 mg | SUBLINGUAL_TABLET | SUBLINGUAL | 2 refills | Status: DC | PRN

## 2017-06-22 MED ORDER — TICAGRELOR 90 MG PO TABS: 90.0000 mg | ORAL_TABLET | Freq: Two times a day (BID) | ORAL | 2 refills | Status: DC

## 2017-06-22 MED ORDER — ASPIRIN 81 MG PO CHEW: 81.0000 mg | CHEWABLE_TABLET | Freq: Every day | ORAL | Status: DC

## 2017-06-22 MED ORDER — THE SENSUOUS HEART BOOK
Freq: Once | Status: AC
  Administered 2017-06-22: 11:00:00
  Filled 2017-06-22: qty 1

## 2017-06-22 NOTE — Progress Notes (Signed)
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  His troponins peaked at 11.  He is asymptomatic.  Normal LV Function.  Other problems include hyperlipidemia intolerant to statin therapy.    Subjective   Feels great. No CP or SOB.  Has walked all over the unit.  Ready for d/c home  Inpatient Medications    Scheduled Meds: . aspirin  81 mg Oral Daily  . ezetimibe  10 mg Oral Daily  . heparin  5,000 Units Subcutaneous Q8H  . metoprolol tartrate  12.5 mg Oral BID  . sodium chloride flush  3 mL Intravenous Q12H  . the sensuous heart book   Does not apply Once  . ticagrelor  90 mg Oral BID   Continuous Infusions: . sodium chloride     PRN Meds: sodium chloride, acetaminophen, diazepam, ondansetron (ZOFRAN) IV, sodium chloride flush, zolpidem   Vital Signs    Vitals:   06/22/17 0000 06/22/17 0400 06/22/17 0739 06/22/17 0800  BP: 114/69 107/62 125/89   Pulse: 63 62 62 68  Resp: 18 17 18 17   Temp:   (!) 97.3 F (36.3 C)   TempSrc:   Oral   SpO2: 98% 98% 94%   Weight:      Height:        Intake/Output Summary (Last 24 hours) at 06/22/2017 0930 Last data filed at 06/21/2017 1500 Gross per 24 hour  Intake 720 ml  Output -  Net 720 ml   Filed Weights   06/20/17 1300  Weight: 152 lb (68.9 kg)    Telemetry    NSR with rate PVCs- Personally Reviewed  ECG    Not checked.  Physical Exam   Physical Exam  Constitutional: He is oriented to person, place, and time. He appears well-developed and well-nourished. No distress.  HENT:  Head: Normocephalic and atraumatic.  Eyes: EOM are normal.  Neck: No hepatojugular reflux and no JVD present. Carotid bruit is not present.  Cardiovascular: Normal  rate, regular rhythm, normal heart sounds and intact distal pulses. Exam reveals no gallop and no friction rub.  No murmur heard. R radial cath site C/D/I - mild bruising.  Pulmonary/Chest: Effort normal and breath sounds normal. No respiratory distress. He has no rales.  Abdominal: Soft. Bowel sounds are normal. He exhibits no distension. There is no tenderness.  Musculoskeletal: Normal range of motion. He exhibits no edema.  Neurological: He is alert and oriented to person, place, and time.  Skin: Skin is warm and dry.  Psychiatric: He has a normal mood and affect. His behavior is normal. Thought content normal.  Nursing note and vitals reviewed.   Labs    Chemistry Recent Labs  Lab 06/20/17 1124 06/20/17 1140 06/20/17 1650 06/21/17 0222  NA 136 140  --  137  K 3.7 3.8  --  3.9  CL 107 104  --  106  CO2 19*  --   --  22  GLUCOSE 137* 141*  --  115*  BUN 23* 23*  --  14  CREATININE 1.26* 1.10 1.02 0.98  CALCIUM 8.8*  --   --  8.1*  PROT 6.6  --   --   --   ALBUMIN 3.8  --   --   --  AST 36  --   --   --   ALT 21  --   --   --   ALKPHOS 66  --   --   --   BILITOT 1.1  --   --   --   GFRNONAA >60  --  >60 >60  GFRAA >60  --  >60 >60  ANIONGAP 10  --   --  9     Hematology Recent Labs  Lab 06/20/17 1124 06/20/17 1140 06/20/17 1650 06/21/17 0222  WBC 10.6*  --  9.3 8.3  RBC 5.17  --  4.83 4.68  HGB 15.7 15.6 14.4 13.9  HCT 44.1 46.0 41.8 40.7  MCV 85.3  --  86.5 87.0  MCH 30.4  --  29.8 29.7  MCHC 35.6  --  34.4 34.2  RDW 13.2  --  13.4 13.6  PLT 257  --  224 206    Cardiac Enzymes Recent Labs  Lab 06/20/17 1124 06/20/17 1650  TROPONINI 0.29* 11.09*   No results for input(s): TROPIPOC in the last 168 hours.   BNPNo results for input(s): BNP, PROBNP in the last 168 hours.   DDimer No results for input(s): DDIMER in the last 168 hours.   Radiology    No results found.  Cardiac Studies   Cardiac catheterization (06/20/17)  Conclusion      Mid LAD lesion is 10% stenosed.  Prox Cx lesion is 30% stenosed.  Mid Cx lesion is 20% stenosed.  Prox RCA lesion is 20% stenosed.  Mid RCA lesion is 20% stenosed.  Dist RCA lesion is 100% stenosed.  A drug-eluting stent was successfully placed using a STENT SYNERGY DES 3X24.  Post intervention, there is a 0% residual stenosis.   Acute inferior ST segment elevation myocardial infarction secondary to total occlusion of a very large dominant RCA immediately beyond the acute margin.  Successful PCI with to the RCA with ultimate insertion of a 3.024 mm Synergy DES stent postdilated to 3.29 mm with the percent occlusion reduced to 0% and with resumption of brisk TIMI-3 flow.  The distal RCA supplied very large PDA and PLA vessels which supply the entire posterior wall to the apex.  RECOMMENDATION: DAPT for minimum of 1 year.  The patient will be started on low-dose metoprolol post MI.  He has a history of statin-induced rhabdomyolysis.  Will add Zetia.  However, with his concomitant CAD, recommend consideration for PCSK9 inhibition for optimal treatment with aggressive LDL lowering to induce plaque regression.    Assessment & Plan   Stable  POD #2 s/p Inf STEMI with 100% dRCA treated with DES  Principal Problem:   STEMI involving oth coronary artery of inferior wall (HCC) Active Problems:   CAD S/P percutaneous coronary angioplasty   Hyperlipidemia with target low density lipoprotein (LDL) cholesterol less than 70 mg/dL   1: Inferior STEMI /Coronary artery disease-PCI- Day 2 s/p inferior STEMI with peak troponin of 11.    Continue DAPT (on aspirin and Brilinta) x 1 yr.    BP & HR stable on Low dose Metoprolol - can titrate as OP.    2: Hyperlipidemia-statin intolerant.    Started on Zetia  May need CVRR referral to consider PCSK9-   Plan was for Fast-Track D/c today - stable BP, HR, Tele. NO recurrent Angina or CHF - moderate Troponin increase.  OK for  d/c.   For questions or updates, please contact [place] Please consult [email or web address] for contact info under  Cardiology/STEMI.      Signed, Glenetta Hew, MD  06/22/2017, 9:30 AM

## 2017-06-22 NOTE — Progress Notes (Signed)
Pt discharged home with wife.  All teaching complete and questions answered.  Pt aware of scheduled appointment and that prescriptions have been sent to [facility] on [address] (per pt request).  30 day prescription for Brilinta given to patient for 30 day free card.

## 2017-06-22 NOTE — Discharge Summary (Signed)
Discharge Summary    Patient ID: Cameron Hudson,  MRN: [id number], DOB/AGE: May 09, 1966 51 y.o.  Admit date: 06/20/2017 Discharge date: 06/22/2017  Primary Care Provider: Shon Hudson Primary Cardiologist: Cameron Hudson   Discharge Diagnoses    Principal Problem:   STEMI involving oth coronary artery of inferior wall [facility]) Active Problems:   CAD S/P percutaneous coronary angioplasty   Hyperlipidemia with target low density lipoprotein (LDL) cholesterol less than 70 mg/dL   Allergies Allergies  Allergen Reactions  . Advair Diskus [Fluticasone-Salmeterol] Other (See Comments)    Increased BP  . Cheese Other (See Comments)    GI upset   . Statins Other (See Comments)    Muscle pain  . Sulfa Antibiotics Rash    Diagnostic Studies/Procedures    Cath: 06/20/17  Conclusion     Mid LAD lesion is 10% stenosed.  Prox Cx lesion is 30% stenosed.  Mid Cx lesion is 20% stenosed.  Prox RCA lesion is 20% stenosed.  Mid RCA lesion is 20% stenosed.  Dist RCA lesion is 100% stenosed.  A drug-eluting stent was successfully placed using a STENT SYNERGY DES 3X24.  Post intervention, there is a 0% residual stenosis.   Acute inferior ST segment elevation myocardial infarction secondary to total occlusion of a very large dominant RCA immediately beyond the acute margin.  Mild nonobstructive concomitant CAD with 10% narrowing in the mid LAD; 30% proximal circumflex stenosis with 20% mid AV groove stenosis; and a large dominant RCA with 20% proximal narrowing, 20% narrowing before the acute margin, and complete total occlusion with TIMI 0 flow to a large dominant vessel.  There are collaterals from the LAD to the PLA.  Successful PCI with to the RCA with ultimate insertion of a 3.024 mm Synergy DES stent postdilated to 3.29 mm with the percent occlusion reduced to 0% and with resumption of brisk TIMI-3 flow.  The distal RCA supplied very large PDA and PLA vessels which supply the  entire posterior wall to the apex.  RECOMMENDATION: DAPT for minimum of 1 year.  The patient will be started on low-dose metoprolol post MI.  He has a history of statin-induced rhabdomyolysis.  Will add Zetia.  However, with his concomitant CAD, recommend consideration for PCSK9 inhibition for optimal treatment with aggressive LDL lowering to induce plaque regression.   _____________   History of Present Illness     51 yo male with PMH of HL. Reports he is followed by his PCP on a regular basis. Saw Dr. Oval Hudson back in 2016 and underwent exercise myoview that was normal. Does report family hx of CAD with his grandfather, but unsure able his father. Recently had a Coronary CT with calcium score of 136, with calcifications in the LAD, RCA, and LCx. Reports he has been in his usual state of health until the day of admission. Reported he went to the gym today and did his normal work out. Then was at [place] around 945am when he developed on set of chest pain and shortness of breath. EMS was called. EKG showed inferior ST elevation. Code STEMI was called in the field. Given 324mg  ASA, and zofran prior to arrival. He was brought directly to the cath lab for emergent cath.  Hospital Course     Underwent cardiac cath noted above with 100% dRCA lesion treated with PCI/DES x1. LV gram showed normal EF. Troponin peaked at 11. Plan for DAPT with ASA/Brilinta for at least one year. History of statin intolerance, therefore Zetia  was added. Also added low dose metoprolol with blood pressures tolerating. LDL 158, Hgb A1c 5.6. Will need referral to lipid clinic as outpatient. Worked well with cardiac rehab without any recurrent chest pain.   Cameron Hudson was seen by Dr. Ellyn Hudson and determined stable for discharge home. Follow up in the office has been arranged. Medications are listed below.   _____________  Discharge Vitals Blood pressure 125/89, pulse 68, temperature (!) 97.3 F (36.3 C), temperature  source Oral, resp. rate 17, height 5\' 9"  (1.753 m), weight 152 lb (68.9 kg), SpO2 94 %.  Filed Weights   06/20/17 1300  Weight: 152 lb (68.9 kg)    Labs & Radiologic Studies    CBC Recent Labs    06/20/17 1650 06/21/17 0222  WBC 9.3 8.3  HGB 14.4 13.9  HCT 41.8 40.7  MCV 86.5 87.0  PLT 224 469   Basic Metabolic Panel Recent Labs    06/20/17 1124 06/20/17 1140 06/20/17 1650 06/21/17 0222  NA 136 140  --  137  K 3.7 3.8  --  3.9  CL 107 104  --  106  CO2 19*  --   --  22  GLUCOSE 137* 141*  --  115*  BUN 23* 23*  --  14  CREATININE 1.26* 1.10 1.02 0.98  CALCIUM 8.8*  --   --  8.1*   Liver Function Tests Recent Labs    06/20/17 1124  AST 36  ALT 21  ALKPHOS 66  BILITOT 1.1  PROT 6.6  ALBUMIN 3.8   No results for input(s): LIPASE, AMYLASE in the last 72 hours. Cardiac Enzymes Recent Labs    06/20/17 1124 06/20/17 1650  TROPONINI 0.29* 11.09*   BNP Invalid input(s): POCBNP D-Dimer No results for input(s): DDIMER in the last 72 hours. Hemoglobin A1C Recent Labs    06/20/17 1124  HGBA1C 5.6   Fasting Lipid Panel Recent Labs    06/20/17 1124  CHOL 220*  HDL 33*  LDLCALC 158*  TRIG 144  CHOLHDL 6.7   Thyroid Function Tests No results for input(s): TSH, T4TOTAL, T3FREE, THYROIDAB in the last 72 hours.  Invalid input(s): FREET3 _____________  Ct Cardiac Scoring  Result Date: 05/25/2017 CLINICAL DATA:  High cholesterol EXAM: CT HEART FOR CALCIUM SCORING TECHNIQUE: CT heart was performed on a 64 channel system using prospective ECG gating. A non-contrast exam for calcium scoring was performed. Note that this exam targets the heart and the chest was not imaged in its entirety. COMPARISON:  None. FINDINGS: Technical quality: Good. CORONARY CALCIUM Total Agatston Score: 136 with calcifications noted most pronounced in the left anterior descending coronary artery and right coronary artery, also noted in left circumflex coronary artery. MESA database  percentile:  66 OTHER FINDINGS: Cardiovascular: Heart is normal size. Visualized aorta normal caliber. Mediastinum/Nodes: No adenopathy in the lower mediastinum or hila. Lungs/Pleura: Visualized lungs clear.  No effusions. Upper Abdomen: Imaging into the upper abdomen shows no acute findings. Small low-density lesion within the lateral segment of the left hepatic lobe measures 10 mm, likely small cyst. Musculoskeletal: Chest wall soft tissues are unremarkable. No acute bony abnormality. IMPRESSION: Total coronary calcium score of 136 with calcifications in the left anterior descending, right coronary, and left circumflex coronary artery. This is 92nd percentile based on age and gender related control group. No acute or significant extracardiac abnormality. Electronically Signed   By: Rolm Baptise M.D.   On: 05/25/2017 10:21   Disposition   Pt is being  discharged home today in good condition.  Follow-up Plans & Appointments    Follow-up Information    Lendon Colonel, NP Follow up on 06/29/2017.   Specialties:  Nurse Practitioner, Radiology, Cardiology Why:  at 10am for your follow up appt.  Contact information: [place] [address] [phone]          Discharge Instructions    Amb Referral to Cardiac Rehabilitation   Complete by:  As directed    Diagnosis:   STEMI Coronary Stents     Call MD for:  redness, tenderness, or signs of infection (pain, swelling, redness, odor or green/yellow discharge around incision site)   Complete by:  As directed    Diet - low sodium heart healthy   Complete by:  As directed    Discharge instructions   Complete by:  As directed    Radial Site Care Refer to this sheet in the next few weeks. These instructions provide you with information on caring for yourself after your procedure. Your caregiver may also give you more specific instructions. Your treatment has been planned according to current medical practices, but problems  sometimes occur. Call your caregiver if you have any problems or questions after your procedure. HOME CARE INSTRUCTIONS You may shower the day after the procedure.Remove the bandage (dressing) and gently wash the site with plain soap and water.Gently pat the site dry.  Do not apply powder or lotion to the site.  Do not submerge the affected site in water for 3 to 5 days.  Inspect the site at least twice daily.  Do not flex or bend the affected arm for 24 hours.  No lifting over 5 pounds (2.3 kg) for 5 days after your procedure.  Do not drive home if you are discharged the same day of the procedure. Have someone else drive you.  You may drive 24 hours after the procedure unless otherwise instructed by your caregiver.  What to expect: Any bruising will usually fade within 1 to 2 weeks.  Blood that collects in the tissue (hematoma) may be painful to the touch. It should usually decrease in size and tenderness within 1 to 2 weeks.  SEEK IMMEDIATE MEDICAL CARE IF: You have unusual pain at the radial site.  You have redness, warmth, swelling, or pain at the radial site.  You have drainage (other than a small amount of blood on the dressing).  You have chills.  You have a fever or persistent symptoms for more than 72 hours.  You have a fever and your symptoms suddenly get worse.  Your arm becomes pale, cool, tingly, or numb.  You have heavy bleeding from the site. Hold pressure on the site.   PLEASE DO NOT MISS ANY DOSES OF YOUR BRILINTA!!!!! Also keep a log of you blood pressures and bring back to your follow up appt. Please call the office with any questions.   Patients taking blood thinners should generally stay away from medicines like ibuprofen, Advil, Motrin, naproxen, and Aleve due to risk of stomach bleeding. You may take Tylenol as directed or talk to your primary doctor about alternatives.   Increase activity slowly   Complete by:  As directed       Discharge Medications       Medication List    TAKE these medications   aspirin 81 MG chewable tablet Chew 1 tablet (81 mg total) by mouth daily.   EPINEPHrine 0.3 mg/0.3 mL Soaj injection Commonly known as:  EPI-PEN See admin instructions.   ezetimibe 10 MG tablet Commonly known as:  ZETIA Take 1 tablet (10 mg total) by mouth daily.   metoprolol tartrate 25 MG tablet Commonly known as:  LOPRESSOR Take 0.5 tablets (12.5 mg total) by mouth 2 (two) times daily.   nitroGLYCERIN 0.4 MG SL tablet Commonly known as:  NITROSTAT Place 1 tablet (0.4 mg total) under the tongue every 5 (five) minutes as needed.   ticagrelor 90 MG Tabs tablet Commonly known as:  BRILINTA Take 1 tablet (90 mg total) by mouth 2 (two) times daily.        Aspirin prescribed at discharge?  Yes High Intensity Statin Prescribed? (Lipitor 40-80mg  or Crestor 20-40mg ): No: statin intolerant, consider lipid clinic referral.  Beta Blocker Prescribed? Yes For EF <40%, was ACEI/ARB Prescribed? No: Ef ok ADP Receptor Inhibitor Prescribed? (i.e. Plavix etc.-Includes Medically Managed Patients): Yes For EF <40%, Aldosterone Inhibitor Prescribed? No EF ok Was EF assessed during THIS hospitalization? Yes Was Cardiac Rehab II ordered? (Included Medically managed Patients): Yes   Outstanding Labs/Studies   N/a   Duration of Discharge Encounter   Greater than 30 minutes including physician time.  Signed, Reino Bellis NP-C 06/22/2017, 10:32 AM

## 2017-06-25 ENCOUNTER — Telehealth: Payer: Self-pay | Admitting: Cardiovascular Disease

## 2017-06-25 NOTE — Telephone Encounter (Signed)
New Message     Pt c/o BP issue: STAT if pt c/o blurred vision, one-sided weakness or slurred speech  1. What are your last 5 BP readings?  145/90  2. Are you having any other symptoms (ex. Dizziness, headache, blurred vision, passed out)? None , just can feel that it is high  3. What is your BP issue?  Wants to increase his  metoprolol tartrate (LOPRESSOR) 25 MG tablet Take 0.5 tablets (12.5 mg total) by mouth 2 (two) times daily.

## 2017-06-25 NOTE — Telephone Encounter (Signed)
Returned call. Pt has f/u on Friday for hospital discharge. Noted he's on several new medications. Did not have questions regarding his meds, but did want to make sure BP reading wasn't too high. He had reading of 145/90 today, but no other recent readings. meds started at discharged on 12/28. No symptoms - patient states he can "just tell" when his BP is elevated.  Advised to continue meds at current dosing and f/u for appt on Friday. In interim, continue to check BPs (and HRs) daily and bring log of these to visit - if warranted can adjust BP med based on these findings at that time.  I advised pt to call back if he notes any readings of systolic >790 or if he is experiencing new symptoms. He expressed his understanding and thanks for call.

## 2017-06-28 ENCOUNTER — Telehealth (HOSPITAL_COMMUNITY): Payer: Self-pay

## 2017-06-28 NOTE — Telephone Encounter (Signed)
Patients insurance is active and benefits verified through [facility] - $35.00 co-pay, deductible amount of $10,500/Unsure of how much is met per representative, no out of pocket, no co-insurance, and no pre-authorization is required.   Patient will be contacted and scheduled after their follow up appt with the Cardiologist office upon review by the RN Navigator.

## 2017-06-28 NOTE — Telephone Encounter (Signed)
agree

## 2017-06-29 ENCOUNTER — Ambulatory Visit (INDEPENDENT_AMBULATORY_CARE_PROVIDER_SITE_OTHER): Payer: Self-pay | Admitting: Adult Health

## 2017-06-29 ENCOUNTER — Encounter: Payer: Self-pay | Admitting: Adult Health

## 2017-06-29 VITALS — BP 123/85 | HR 62 | Ht 69.0 in | Wt 152.4 lb

## 2017-06-29 DIAGNOSIS — E7800 Pure hypercholesterolemia, unspecified: Secondary | ICD-10-CM

## 2017-06-29 DIAGNOSIS — I2510 Atherosclerotic heart disease of native coronary artery without angina pectoris: Secondary | ICD-10-CM

## 2017-06-29 DIAGNOSIS — I10 Essential (primary) hypertension: Secondary | ICD-10-CM

## 2017-06-29 NOTE — Patient Instructions (Addendum)
Medication Instructions:  No changes-Your physician recommends that you continue on your current medications as directed. Please refer to the Current Medication list given to you today.  If you need a refill on your cardiac medications before your next appointment, please call your pharmacy.  Follow-Up: Your physician wants you to follow-up in: 3 months with DR [place], DNP.   REFER TO [facility]IPID [facility] FOR PCSK9 DISCUSSION   Thank you for choosing [facility] at [facility]!!

## 2017-06-29 NOTE — Progress Notes (Signed)
Cardiology Office Note   Date:  06/29/2017   ID:  BRANCE DARTT, DOB [birth year], MRN [id number]  PCP:  Shon Baton, MD  Cardiologist: Dr. Oval Linsey Chief Complaint  Patient presents with  . Hospitalization Follow-up  . 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  He underwent cardiac catheterization.  This required drug-eluting stent a 100% distal RCA.  This reduced 100% stenosis to 0%.  Cath: 06/20/17  Conclusion     Mid LAD lesion is 10% stenosed.  Prox Cx lesion is 30% stenosed.  Mid Cx lesion is 20% stenosed.  Prox RCA lesion is 20% stenosed.  Mid RCA lesion is 20% stenosed.  Dist RCA lesion is 100% stenosed.  A drug-eluting stent was successfully placed using a STENT SYNERGY DES 3X24.  Post intervention, there is a 0% residual stenosis.  Acute inferior ST segment elevation myocardial infarction secondary to total occlusion of a very large dominant RCA immediately beyond the acute margin.  Mild nonobstructive concomitant CAD with 10% narrowing in the mid LAD; 30% proximal circumflex stenosis with 20% mid AV groove stenosis; and a large dominant RCA with 20% proximal narrowing, 20% narrowing before the acute margin, and complete total occlusion with TIMI 0 flow to a large dominant vessel. There are collaterals from the LAD to the PLA.  Successful PCI with to the RCA with ultimate insertion of a 3.024 mm Synergy DES stent postdilated to 3.29 mm with the percent occlusion reduced to 0% and with resumption of brisk TIMI-3 flow. The distal RCA supplied very large PDA and PLA vessels which supply the entire posterior wall to the apex.  RECOMMENDATION: DAPT for minimum of 1 year. The patient will be started on low-dose metoprolol post MI.  He has a history of statin-induced rhabdomyolysis. Will add Zetia. However, with his concomitant CAD, recommend consideration for PCSK9 inhibition for optimal treatment with aggressive LDL lowering to induce plaque regression.   On discharge, Zetia was added to his medical regimen, continued on dual antiplatelet therapy.  Follow-up labs will be needed, with potential to refer to lipid clinic for consideration of PCSK 9 inhibition therapy  He comes today with multiple questions concerning his cardiac catheterization, coronary artery disease, medications, physical activity, labs, future plans concerning hypercholesterolemia.  He denies any further recurrence of angina.  His equivalent is low back pain.  He works out physically several times a week doing a "HIT" regimen, which is very physically demanding.   Past Medical History:  Diagnosis Date  . Asthma   . CAD S/P percutaneous coronary angioplasty 06/20/2017   Inferior STEMI - 100% dRCA --> DES PCI with Synergy 3.0 x 24 (3.3 mm). Mild 10-30% disease in p-mRCA, Cx & LAD.  EF Normal.   . Hemorrhoids   . Hyperlipidemia   . IBS (irritable bowel syndrome)   . Personal history of colonic polyps 2004   12 mm adenoma  . Seasonal allergies   . STEMI involving oth coronary artery of inferior wall ([place]) 06/20/2017   12/18 PCI/DESx1 to dRCA, normal EF    Past Surgical History:  Procedure Laterality Date  . COLONOSCOPY W/ POLYPECTOMY  08/27/2002; 08/30/2005; 01/06/2011   3mm adenoma, diverticulosis, hemorrhoids; normal;  diverticulosis and small hemorrhoids  . CORONARY STENT INTERVENTION N/A 06/20/2017   Procedure: CORONARY STENT INTERVENTION;  Surgeon: Troy Sine, MD;  Location: [place] [facility];  Service: Cardiovascular;  Laterality: N/A;  . LEFT HEART CATH AND CORONARY ANGIOGRAPHY N/A 06/20/2017   Procedure: LEFT HEART CATH AND CORONARY ANGIOGRAPHY;  Surgeon: Troy Sine, MD;  Location: [place] [facility];  Service: Cardiovascular;   Laterality: N/A;  . NASAL SINUS SURGERY  Dec 2011     Current Outpatient Medications  Medication Sig Dispense Refill  . aspirin 81 MG chewable tablet Chew 1 tablet (81 mg total) by mouth daily.    Marland Kitchen EPINEPHrine 0.3 mg/0.3 mL IJ SOAJ injection See admin instructions.  1  . ezetimibe (ZETIA) 10 MG tablet Take 1 tablet (10 mg total) by mouth daily. 30 tablet 3  . metoprolol tartrate (LOPRESSOR) 25 MG tablet Take 0.5 tablets (12.5 mg total) by mouth 2 (two) times daily. 60 tablet 2  . nitroGLYCERIN (NITROSTAT) 0.4 MG SL tablet Place 1 tablet (0.4 mg total) under the tongue every 5 (five) minutes as needed. 25 tablet 2  . ticagrelor (BRILINTA) 90 MG TABS tablet Take 1 tablet (90 mg total) by mouth 2 (two) times daily. 180 tablet 2   No current facility-administered medications for this visit.     Allergies:   Advair diskus [fluticasone-salmeterol]; Cheese; Statins; and Sulfa antibiotics    Social History:  The patient  reports that  has never smoked. he has never used smokeless tobacco. He reports that he drinks alcohol. He reports that he does not use drugs.   Family History:  The patient's family history includes Allergies in his father.    ROS: All other systems are reviewed and negative. Unless otherwise mentioned in H&P    PHYSICAL EXAM: VS:  BP 123/85   Pulse 62   Ht 5\' 9"  (1.753 m)   Wt 152 lb 6.4 oz (69.1 kg)   BMI 22.51 kg/m  , BMI Body mass index is 22.51 kg/m. GEN: Well nourished, well developed, in no acute distress  HEENT: normal  Neck: no JVD, carotid bruits, or masses Cardiac: RRR; no murmurs, rubs, or gallops,no edema  Respiratory:  clear to auscultation bilaterally, normal work of breathing GI: soft, nontender, nondistended, + BS MS: no deformity or atrophy significant ecchymosis at the right forearm which is improving over time from right wrist cardiac catheterization insertion site. Skin: warm and dry, no rash Neuro:  Strength and sensation are intact Psych:  euthymic mood, full affect   EKG: Sinus rhythm,, incomplete right bundle branch block, inferior Q waves are noted.  T wave inversion V5 and V6.  Recent Labs: 06/20/2017: ALT 21 06/21/2017: BUN 14; Creatinine, Ser 0.98; Hemoglobin 13.9; Platelets 206; Potassium 3.9; Sodium 137    Lipid Panel    Component Value Date/Time   CHOL 220 (H) 06/20/2017 1124   TRIG 144 06/20/2017 1124   HDL 33 (L) 06/20/2017 1124   CHOLHDL 6.7 06/20/2017 1124   VLDL 29 06/20/2017 1124   LDLCALC 158 (H) 06/20/2017 1124      Wt Readings from Last 3 Encounters:  06/29/17 152 lb 6.4 oz (69.1 kg)  06/20/17 152 lb (68.9 kg)  10/06/16 155 lb (70.3 kg)        ASSESSMENT AND PLAN:  1.  Coronary artery disease: Status post inferior myocardial infarction with 100% mid RCA stenosis.  He is status post drug-eluting stent reducing 100% stenosis to 0%.  He remains on dual antiplatelet therapy with Brilinta and aspirin.  Is also on metoprolol 12.5 mg twice daily.  He  denies any recurrent symptoms.  Multiple questions have been answered, review of cardiac catheterization results to include illustration is reviewed with the patient.  I have gone over each medication in depth.  I have answered all questions.  I have spent greater than 45 minutes with this patient and his wife.  2.  Familial hypercholesterolemia: Patient is intolerant to multiple statins, he has been on 5 separate medications.  He is currently on Zetia 10 mg daily.  I am going to refer him to our lipid clinic for management and possible institution of PC SK 9 inhibitor therapy.  He has multiple questions about this as well.  I have also referred him to pharmacy for further explanation concerning medication and length of time he will begin to see results.  3.  Hypertension: Currently well controlled on metoprolol.  He states his blood pressures are more elevated at home running 140s and 150s.  I have asked him to continue to monitor his blood pressure and  record this for [place] for follow-up appointment.  Need to add low-dose ACE inhibitor.   Current medicines are reviewed at length with the patient today.  I have spent greater than 45 minutes with this patient answering multiple questions and providing explanation of his heart disease and going over catheterization results.  Labs/ tests ordered today include:  Phill Myron. [place] Pugh, ANP, AACC   06/29/2017 11:44 AM    [facility] [address] [place], [place] [address] Phone: [phone]; Fax: [phone]

## 2017-07-11 ENCOUNTER — Telehealth (HOSPITAL_COMMUNITY): Payer: Self-pay

## 2017-07-11 NOTE — Telephone Encounter (Signed)
Attempted to call patient in regards to Cardiac Rehab - Lm on vm °

## 2017-07-18 ENCOUNTER — Ambulatory Visit (INDEPENDENT_AMBULATORY_CARE_PROVIDER_SITE_OTHER): Payer: PRIVATE HEALTH INSURANCE | Admitting: Pharmacist

## 2017-07-18 ENCOUNTER — Encounter: Payer: Self-pay | Admitting: Pharmacist

## 2017-07-18 ENCOUNTER — Telehealth (HOSPITAL_COMMUNITY): Payer: Self-pay

## 2017-07-18 ENCOUNTER — Encounter (HOSPITAL_COMMUNITY): Payer: Self-pay

## 2017-07-18 DIAGNOSIS — E785 Hyperlipidemia, unspecified: Secondary | ICD-10-CM

## 2017-07-18 NOTE — Telephone Encounter (Signed)
2nd attempt to call patient in regards to Cardiac Rehab - Lm on vm. Sending letter. °

## 2017-07-18 NOTE — Progress Notes (Signed)
Patient ID: Cameron Hudson                 DOB: [birth year]                    MRN: [id number]     HPI: Cameron Hudson is a 52 y.o. male patient of Cameron Hudson referred to lipid clinic by Cameron Sims NP . PMH is significant for hyperlipidemia, STEMI on 06/20/2017, and CAD S/P percutaneous coronary angioplasty. He recently had a CT coronary angiogram which showed coronary calcification and his calcium score was 136.  Patient stated that he was told he had high cholesterol levels at the age of 37. Patient has been on several different statins since diagnosis including Zocor, Lipitor, Pravachol which all made him develop myalgias and reports rhabdomyolysis with unknown statin. Patient started Zetia two weeks ago after his MI on 06/20/17.  Current Medications:  Ezetimibe (Zetia) 10 mg daily   Intolerances:  Pravachol  Lipitor Zocor10 and 20 mg  Risk Factors:  Possibly familial hypercholesterolemia per Cameron Hudson score  MI prior to age 60  Hyperlipidemia since age 74  LDL goal: < 70  Diet:  He reports to having a good balanced diet.  Exercise:  Works out at least 4-5 days per week and does Engineer, manufacturing systems.  Family History: Dad HTN; Mat Grandfather CVA, MI  Social History:  Patient reports to never smoking. He does drink alcohol.  Labs: Lipid panel completed on 06/20/17 Chol 220 TG 144 HDL 33 VLDL 29 LDL 158 Lipid panel completed on 02/15/2015 Chol 212 TG 170 HDL 39 VLDL 34 LDL 139  Past Medical History:  Diagnosis Date  . Asthma   . CAD S/P percutaneous coronary angioplasty 06/20/2017   Inferior STEMI - 100% dRCA --> DES PCI with Synergy 3.0 x 24 (3.3 mm). Mild 10-30% disease in p-mRCA, Cx & LAD.  EF Normal.   . Hemorrhoids   . Hyperlipidemia   . IBS (irritable bowel syndrome)   . Personal history of colonic polyps 2004   12 mm adenoma  . Seasonal allergies   . STEMI involving oth coronary artery of inferior wall ([place]) 06/20/2017   12/18 PCI/DESx1 to dRCA, normal EF     Current Outpatient Medications on File Prior to Visit  Medication Sig Dispense Refill  . aspirin 81 MG chewable tablet Chew 1 tablet (81 mg total) by mouth daily.    Marland Kitchen EPINEPHrine 0.3 mg/0.3 mL IJ SOAJ injection See admin instructions.  1  . ezetimibe (ZETIA) 10 MG tablet Take 1 tablet (10 mg total) by mouth daily. 30 tablet 3  . metoprolol tartrate (LOPRESSOR) 25 MG tablet Take 0.5 tablets (12.5 mg total) by mouth 2 (two) times daily. 60 tablet 2  . nitroGLYCERIN (NITROSTAT) 0.4 MG SL tablet Place 1 tablet (0.4 mg total) under the tongue every 5 (five) minutes as needed. 25 tablet 2  . ticagrelor (BRILINTA) 90 MG TABS tablet Take 1 tablet (90 mg total) by mouth 2 (two) times daily. 180 tablet 2   No current facility-administered medications on file prior to visit.     Allergies  Allergen Reactions  . Advair Diskus [Fluticasone-Salmeterol] Other (See Comments)    Increased BP  . Cheese Other (See Comments)    GI upset   . Statins Other (See Comments)    Muscle pain  . Sulfa Antibiotics Rash    Hyperlipidemia with target low density lipoprotein (LDL) cholesterol less than 70 mg/dL According to the 2018  Cholesterol Clinical Practice Guidelines, patient is considered a high risk patient due to previous MI prior to age 33 and his LDL goal is < 70 mg/dL which is not met with last recent LDL of 158 on 06/20/2017.  Based on possible familial hypercholesterolemia (Dutch score of 5) and previous MI, patient qualifies for a high intensity statin; however, patient was intolerant to several statins in the past making him a good candidate for a PCSK9 inhibitor.  Due to patient's concern for adverse reactions to Praluent, patient is going to try a sample of Praluent 75 mg subq this weekend, January 26-27, then contact the lipid clinic on Monday to send  prescription to preferred pharmacy if no ADRs are noted.  Cameron Hudson PharmD Candidate Cameron Hudson PharmD, BCPS, [facility] [place] [phone] 07/18/2017 3:40 PM

## 2017-07-18 NOTE — Assessment & Plan Note (Addendum)
According to the 2018 Cholesterol Clinical Practice Guidelines, patient is considered a high risk patient due to previous MI prior to age 52 and his LDL goal is < 70 mg/dL which is not met with last recent LDL of 158 on 06/20/2017.  Based on possible familial hypercholesterolemia (Dutch score of 5) and previous MI, patient qualifies for a high intensity statin; however, patient was intolerant to several statins in the past making him a good candidate for a PCSK9 inhibitor.  Due to patient's concern for adverse reactions to Praluent, patient is going to try a sample of Praluent 75 mg subq this weekend, January 26-27, then contact the lipid clinic on Monday to send  prescription to preferred pharmacy if no ADRs are noted.

## 2017-07-18 NOTE — Patient Instructions (Signed)
Lipid Clinic  *Trial with Praluent 75mg  sample* *Call back if no ADR to send Rx to prefer pharmacy *Continue lifestyle medication

## 2017-07-19 ENCOUNTER — Telehealth (HOSPITAL_COMMUNITY): Payer: Self-pay

## 2017-07-19 NOTE — Telephone Encounter (Signed)
Patient returned phone call and left message for me. Attempted to return patients call - lm on vm.

## 2017-07-25 ENCOUNTER — Telehealth (HOSPITAL_COMMUNITY): Payer: Self-pay

## 2017-07-25 NOTE — Telephone Encounter (Signed)
3rd attempt to call patient in regards to Cardiac Rehab - lm on vm °

## 2017-08-09 DIAGNOSIS — Z79899 Other long term (current) drug therapy: Secondary | ICD-10-CM | POA: Insufficient documentation

## 2017-08-09 DIAGNOSIS — Z789 Other specified health status: Secondary | ICD-10-CM | POA: Insufficient documentation

## 2017-08-11 DIAGNOSIS — E291 Testicular hypofunction: Secondary | ICD-10-CM | POA: Insufficient documentation

## 2017-08-11 DIAGNOSIS — R7301 Impaired fasting glucose: Secondary | ICD-10-CM | POA: Insufficient documentation

## 2017-08-13 ENCOUNTER — Telehealth: Payer: Self-pay | Admitting: Cardiovascular Disease

## 2017-08-13 DIAGNOSIS — I499 Cardiac arrhythmia, unspecified: Secondary | ICD-10-CM

## 2017-08-13 NOTE — Telephone Encounter (Signed)
New message   Patient calling, states he has been unable to  do cardiac rehab because of his schedule. Patient is requesting order for holter monitor.

## 2017-08-13 NOTE — Telephone Encounter (Signed)
Pt unable to participate in cardiac rehab due to work  Schedule.Per pt nurses suggested maybe  pt could wear a holter on day that he is going to exercise and monitor heart  Per pt is currently exercising 3 x a week . Will forward to Dr Oval Linsey for review /cy

## 2017-08-13 NOTE — Telephone Encounter (Signed)
I don't think this is needed unless he is having symptoms.

## 2017-08-13 NOTE — Telephone Encounter (Signed)
Left message to call back  

## 2017-08-15 NOTE — Telephone Encounter (Signed)
Spoke with patients wife, ok per patient. He does have some irregular heartbeats at times. He would also like for peace of mind given his recent cardiac event. Discussed with Dr Arnold Long DNP and will get a 7 day event monitor. Advise patient, aware of date, time, and [employer] office. He could only go to appointments Friday afternoon, first avail bale given

## 2017-08-24 ENCOUNTER — Ambulatory Visit (INDEPENDENT_AMBULATORY_CARE_PROVIDER_SITE_OTHER): Payer: PRIVATE HEALTH INSURANCE

## 2017-08-24 DIAGNOSIS — I499 Cardiac arrhythmia, unspecified: Secondary | ICD-10-CM

## 2017-09-27 ENCOUNTER — Ambulatory Visit: Payer: PRIVATE HEALTH INSURANCE | Admitting: Cardiovascular Disease

## 2017-09-28 ENCOUNTER — Ambulatory Visit: Payer: PRIVATE HEALTH INSURANCE | Admitting: Cardiovascular Disease

## 2017-10-26 ENCOUNTER — Ambulatory Visit: Payer: PRIVATE HEALTH INSURANCE | Admitting: Physician Assistant

## 2017-10-26 ENCOUNTER — Encounter: Payer: Self-pay | Admitting: Physician Assistant

## 2017-10-26 VITALS — BP 124/78 | HR 63 | Ht 68.0 in | Wt 152.0 lb

## 2017-10-26 DIAGNOSIS — I10 Essential (primary) hypertension: Secondary | ICD-10-CM | POA: Diagnosis not present

## 2017-10-26 DIAGNOSIS — I499 Cardiac arrhythmia, unspecified: Secondary | ICD-10-CM | POA: Diagnosis not present

## 2017-10-26 DIAGNOSIS — I2510 Atherosclerotic heart disease of native coronary artery without angina pectoris: Secondary | ICD-10-CM

## 2017-10-26 DIAGNOSIS — E785 Hyperlipidemia, unspecified: Secondary | ICD-10-CM

## 2017-10-26 NOTE — Progress Notes (Signed)
Cardiology Office Note   Date:  10/26/2017   ID:  Cameron Hudson, DOB [birth year], MRN [id number]  PCP:  Shon Baton, MD  Cardiologist: Dr. Oval Linsey, 03/19/2015 Arnold Long, DNP 06/29/2017 Rosaria Ferries, PA-C   Chief Complaint  Patient presents with  . Follow-up    had surgery 12/26, denies chest pains, swelling in hands/feet. pt does complain of SOB when taking the betablocker.    History of Present Illness: Cameron Hudson is a 52 y.o. male with a history of HLD, Inf STEMI 06/20/2017 s/p DES dRCA, EF nl, intol statins>> referred to lipid clinic for PCSK9  Phone notes 2/20 regarding irregular heartbeats, 7-day event monitor noted with PACs  [name] presents for cardiology follow up.  He exercises regularly, does HIT and Cross-fit. He is doing well with this. However, still cannot walk up 3 flights of stairs without getting SOB. No recent change.  He feels he gets a good cardiovascular workout during the HIT class.  He never gets chest pain.  He followed up with the Lipid Clinic at [facility], is now on Repatha 140 mg q month, HDL is now 51, LDL is now 48. The people at [facility] are pleased.  No side effects.  He does not like the Coreg any better than the metoprolol. ED, fatigue and general malaise.    Past Medical History:  Diagnosis Date  . Asthma   . CAD S/P percutaneous coronary angioplasty 06/20/2017   Inferior STEMI - 100% dRCA --> DES PCI with Synergy 3.0 x 24 (3.3 mm). Mild 10-30% disease in p-mRCA, Cx & LAD.  EF Normal.   . Hemorrhoids   . Hyperlipidemia   . IBS (irritable bowel syndrome)   . Personal history of colonic polyps 2004   12 mm adenoma  . Seasonal allergies   . STEMI involving oth coronary artery of inferior wall ([place]) 06/20/2017   12/18 PCI/DESx1 to dRCA, normal EF    Past Surgical History:  Procedure Laterality Date  . COLONOSCOPY W/ POLYPECTOMY  08/27/2002; 08/30/2005; 01/06/2011   62mm adenoma, diverticulosis, hemorrhoids; normal;   diverticulosis and small hemorrhoids  . CORONARY STENT INTERVENTION N/A 06/20/2017   Procedure: CORONARY STENT INTERVENTION;  Surgeon: Troy Sine, MD;  Location: [facility];  Service: Cardiovascular;  Laterality: N/A;  . LEFT HEART CATH AND CORONARY ANGIOGRAPHY N/A 06/20/2017   Procedure: LEFT HEART CATH AND CORONARY ANGIOGRAPHY;  Surgeon: Troy Sine, MD;  Location: [facility];  Service: Cardiovascular;  Laterality: N/A;  . NASAL SINUS SURGERY  Dec 2011    Current Outpatient Medications  Medication Sig Dispense Refill  . aspirin 81 MG chewable tablet Chew 1 tablet (81 mg total) by mouth daily.    . carvedilol (COREG) 6.25 MG tablet Take 6.25 mg by mouth 2 (two) times daily with a meal.   3  . EPINEPHrine 0.3 mg/0.3 mL IJ SOAJ injection See admin instructions.  1  . Evolocumab (REPATHA SURECLICK) 035 MG/ML SOAJ Inject 140 mLs into the skin every 30 (thirty) days.    Marland Kitchen ezetimibe (ZETIA) 10 MG tablet Take 1 tablet (10 mg total) by mouth daily. 30 tablet 3  . niacin 500 MG tablet Take 500 mg by mouth 2 (two) times daily with a meal.    . nitroGLYCERIN (NITROSTAT) 0.4 MG SL tablet Place 1 tablet (0.4 mg total) under the tongue every 5 (five) minutes as needed. 25 tablet 2  . ticagrelor (BRILINTA) 90 MG TABS tablet Take  1 tablet (90 mg total) by mouth 2 (two) times daily. 180 tablet 2   No current facility-administered medications for this visit.     Allergies:   Advair diskus [fluticasone-salmeterol]; Cheese; Statins; and Sulfa antibiotics    Social History:  The patient  reports that he has never smoked. He has never used smokeless tobacco. He reports that he drinks alcohol. He reports that he does not use drugs.   Family History:  The patient's family history includes Allergies in his father; Arrhythmia in his father and mother; Stroke in his mother.    ROS:  Please see the history of present illness. All other systems are reviewed and negative.    PHYSICAL  EXAM: VS:  BP 124/78 (BP Location: Left Arm)   Pulse 63   Ht 5\' 8"  (1.727 m)   Wt 152 lb (68.9 kg)   BMI 23.11 kg/m  , BMI Body mass index is 23.11 kg/m. GEN: Well nourished, well developed, male in no acute distress  HEENT: normal for age  Neck: no JVD, no carotid bruit, no masses Cardiac: RRR; soft flow murmur, no rubs, or gallops Respiratory:  clear to auscultation bilaterally, normal work of breathing GI: soft, nontender, nondistended, + BS MS: no deformity or atrophy; no edema; distal pulses are 2+ in all 4 extremities   Skin: warm and dry, no rash Neuro:  Strength and sensation are intact Psych: euthymic mood, full affect   EKG:  EKG is not ordered today.   Cath: 06/20/17 Conclusion    Mid LAD lesion is 10% stenosed.  Prox Cx lesion is 30% stenosed.  Mid Cx lesion is 20% stenosed.  Prox RCA lesion is 20% stenosed.  Mid RCA lesion is 20% stenosed.  Dist RCA lesion is 100% stenosed.  A drug-eluting stent was successfully placed using a STENT SYNERGY DES 3X24.  Post intervention, there is a 0% residual stenosis.  Acute inferior ST segment elevation myocardial infarction secondary to total occlusion of a very large dominant RCA immediately beyond the acute margin.  Mild nonobstructive concomitant CAD with 10% narrowing in the mid LAD; 30% proximal circumflex stenosis with 20% mid AV groove stenosis; and a large dominant RCA with 20% proximal narrowing, 20% narrowing before the acute margin, and complete total occlusion with TIMI 0 flow to a large dominant vessel. There are collaterals from the LAD to the PLA.  Successful PCI with to the RCA with ultimate insertion of a 3.024 mm Synergy DES stent postdilated to 3.29 mm with the percent occlusion reduced to 0% and with resumption of brisk TIMI-3 flow. The distal RCA supplied very large PDA and PLA vessels which supply the entire posterior wall to the apex.  RECOMMENDATION: DAPT for minimum of 1 year. The  patient will be started on low-dose metoprolol post MI. He has a history of statin-induced rhabdomyolysis. Will add Zetia. However, with his concomitant CAD, recommend consideration for PCSK9 inhibition for optimal treatment with aggressive LDL lowering to induce plaque regression.   Event monitor: 08/24/2017 Quality: Fair.  Baseline artifact. Predominant rhythm: sinus rhythm Average heart rate: 72 bpm Occasional PACs Not harmful to him  Recent Labs: 06/20/2017: ALT 21 06/21/2017: BUN 14; Creatinine, Ser 0.98; Hemoglobin 13.9; Platelets 206; Potassium 3.9; Sodium 137    Lipid Panel    Component Value Date/Time   CHOL 220 (H) 06/20/2017 1124   TRIG 144 06/20/2017 1124   HDL 33 (L) 06/20/2017 1124   CHOLHDL 6.7 06/20/2017 1124   VLDL 29 06/20/2017 1124  LDLCALC 158 (H) 06/20/2017 1124     Wt Readings from Last 3 Encounters:  10/26/17 152 lb (68.9 kg)  06/29/17 152 lb 6.4 oz (69.1 kg)  06/20/17 152 lb (68.9 kg)     Other studies Reviewed: Additional studies/ records that were reviewed today include: Office notes, hospital records and testing.  ASSESSMENT AND PLAN:  1.  CAD: He is having no ischemic symptoms.  Continue aspirin, Brilinta, nitroglycerin as needed.  Continue beta-blocker as tolerated.  I did tell him that the beta-blocker is cardioprotective.  2.  Side effects from beta-blocker: He was on a low dose of metoprolol, did not tolerate that.  He is having side effects from the [place].  Decrease the dose from 6.25 mg twice daily to 3.125 mg twice daily.  If he does not tolerate this dose, discuss with Dr. Oval Linsey stopping it completely.  3.  Hyperlipidemia: He is very pleased about the improvement in his lipids with the Repatha.  His LDL is below goal and his HDL has gone up as well.  He is not having side effects from the injections.  Continue these per the [facility] lipid clinic.  4.  Irregular heartbeat: He had PACs on an event monitor, advised him these were not  harmful to him.  Discussed caffeine use with him, it is not high but he may try going without it to see if that helps his symptoms.  5.  Hypertension: Blood pressure is at goal on current therapy.  Current medicines are reviewed at length with the patient today.  The patient has concerns regarding medicines.  Concerns were addressed  The following changes have been made: Decreased beta-blocker  Labs/ tests ordered today include:  No orders of the defined types were placed in this encounter.    Disposition:   FU with Dr. Oval Linsey in a year  Signed, Rosaria Ferries, PA-C  10/26/2017 11:01 AM    [place] Phone: [phone]; Fax: [phone]  This note was written with the assistance of speech recognition software. Please excuse any transcriptional errors.

## 2017-10-26 NOTE — Patient Instructions (Signed)
Medication Instructions:  DECREASE- Carvedilol 3.125 mg twice a day  If you need a refill on your cardiac medications before your next appointment, please call your pharmacy.  Labwork: None Ordered   Testing/Procedures: None Ordered  Follow-Up: Your physician wants you to follow-up in: 1 Year. You should receive a reminder letter in the mail two months in advance. If you do not receive a letter, please call our office [phone].     Thank you for choosing [facility] at [facility]!!

## 2017-10-29 ENCOUNTER — Other Ambulatory Visit: Payer: Self-pay | Admitting: Cardiology

## 2018-04-22 ENCOUNTER — Encounter: Payer: Self-pay | Admitting: Internal Medicine

## 2018-08-07 ENCOUNTER — Telehealth: Payer: Self-pay | Admitting: Cardiovascular Disease

## 2018-08-07 NOTE — Telephone Encounter (Signed)
Fine with me

## 2018-08-07 NOTE — Telephone Encounter (Signed)
Pt would like to switch from Dr.Elaine to Dr. Debara Pickett

## 2018-08-11 NOTE — Telephone Encounter (Signed)
Ok with me as well.  Dr. Lemmie Evens

## 2018-09-04 ENCOUNTER — Encounter: Payer: Self-pay | Admitting: Internal Medicine

## 2018-09-25 ENCOUNTER — Ambulatory Visit: Payer: PRIVATE HEALTH INSURANCE | Admitting: Internal Medicine

## 2018-11-01 ENCOUNTER — Ambulatory Visit: Payer: PRIVATE HEALTH INSURANCE | Admitting: Internal Medicine

## 2018-11-19 ENCOUNTER — Other Ambulatory Visit: Payer: Self-pay | Admitting: Cardiovascular Disease

## 2019-02-21 ENCOUNTER — Ambulatory Visit: Payer: PRIVATE HEALTH INSURANCE | Admitting: Internal Medicine

## 2019-04-10 ENCOUNTER — Ambulatory Visit: Payer: PRIVATE HEALTH INSURANCE | Admitting: Internal Medicine

## 2019-06-30 ENCOUNTER — Encounter: Payer: Self-pay | Admitting: Internal Medicine

## 2019-07-18 ENCOUNTER — Other Ambulatory Visit: Payer: Self-pay

## 2019-07-18 ENCOUNTER — Ambulatory Visit (AMBULATORY_SURGERY_CENTER): Payer: Self-pay

## 2019-07-18 VITALS — Temp 97.7°F | Ht 69.0 in | Wt 161.6 lb

## 2019-07-18 DIAGNOSIS — Z860100 Personal history of colon polyps, unspecified: Secondary | ICD-10-CM

## 2019-07-18 DIAGNOSIS — Z01818 Encounter for other preprocedural examination: Secondary | ICD-10-CM

## 2019-07-18 NOTE — Progress Notes (Signed)

## 2019-07-31 ENCOUNTER — Telehealth: Payer: Self-pay | Admitting: Internal Medicine

## 2019-07-31 NOTE — Telephone Encounter (Signed)
Pt stated that he had missed covid test but was able to get a rapid test done.  He reported that results were negative and that he will bring copy of results tomorrow.

## 2019-07-31 NOTE — Telephone Encounter (Signed)
Pt called stating that he missed his covid test, but was able to get a rapid test done. Spoke with Dr. Carlean Purl, and he stated that pt is okay to still come in for procedure. Rapid Test results were put in pt chart for scanning.

## 2019-08-01 ENCOUNTER — Encounter: Payer: Self-pay | Admitting: Internal Medicine

## 2019-08-01 ENCOUNTER — Other Ambulatory Visit: Payer: Self-pay

## 2019-08-01 ENCOUNTER — Ambulatory Visit (AMBULATORY_SURGERY_CENTER): Payer: PRIVATE HEALTH INSURANCE | Admitting: Internal Medicine

## 2019-08-01 VITALS — BP 118/79 | HR 60 | Temp 97.5°F | Resp 11 | Ht 69.0 in | Wt 161.6 lb

## 2019-08-01 DIAGNOSIS — D120 Benign neoplasm of cecum: Secondary | ICD-10-CM

## 2019-08-01 DIAGNOSIS — Z860100 Personal history of colon polyps, unspecified: Secondary | ICD-10-CM

## 2019-08-01 DIAGNOSIS — Z860101 Personal history of adenomatous and serrated colon polyps: Secondary | ICD-10-CM

## 2019-08-01 DIAGNOSIS — D125 Benign neoplasm of sigmoid colon: Secondary | ICD-10-CM

## 2019-08-01 HISTORY — DX: Personal history of adenomatous and serrated colon polyps: Z86.0101

## 2019-08-01 HISTORY — DX: Personal history of colonic polyps: Z86.010

## 2019-08-01 MED ORDER — SODIUM CHLORIDE 0.9 % IV SOLN: 500.0000 mL | Freq: Once | INTRAVENOUS | Status: DC

## 2019-08-01 NOTE — Progress Notes (Signed)
Called to room to assist during endoscopic procedure.  Patient ID and intended procedure confirmed with present staff. Received instructions for my participation in the procedure from the performing physician.  

## 2019-08-01 NOTE — Op Note (Signed)
[place] Patient Name: Cameron Hudson Procedure Date: 08/01/2019 9:18 AM MRN: [id number] Endoscopist: Gatha Mayer , MD Age: 54 Referring MD:  Date of Birth: 27-Apr-1966 Gender: Male Account #: [id number] Procedure:                Colonoscopy Indications:              Surveillance: Personal history of adenomatous                            polyps on last colonoscopy > 5 years ago Medicines:                Propofol per Anesthesia, Monitored Anesthesia Care Procedure:                Pre-Anesthesia Assessment:                           - Prior to the procedure, a History and Physical                            was performed, and patient medications and                            allergies were reviewed. The patient's tolerance of                            previous anesthesia was also reviewed. The risks                            and benefits of the procedure and the sedation                            options and risks were discussed with the patient.                            All questions were answered, and informed consent                            was obtained. Prior Anticoagulants: The patient has                            taken no previous anticoagulant or antiplatelet                            agents. ASA Grade Assessment: II - A patient with                            mild systemic disease. After reviewing the risks                            and benefits, the patient was deemed in                            satisfactory condition to undergo the procedure.  After obtaining informed consent, the colonoscope                            was passed under direct vision. Throughout the                            procedure, the patient's blood pressure, pulse, and                            oxygen saturations were monitored continuously. The                            Colonoscope was introduced through the anus and                             advanced to the the cecum, identified by                            appendiceal orifice and ileocecal valve. The                            colonoscopy was performed without difficulty. The                            patient tolerated the procedure well. The quality                            of the bowel preparation was excellent. The                            ileocecal valve, appendiceal orifice, and rectum                            were photographed. The bowel preparation used was                            Miralax via split dose instruction. Scope In: 9:37:57 AM Scope Out: 9:54:54 AM Scope Withdrawal Time: 0 hours 13 minutes 16 seconds  Total Procedure Duration: 0 hours 16 minutes 57 seconds  Findings:                 The perianal and digital rectal examinations were                            normal. Pertinent negatives include normal prostate                            (size, shape, and consistency).                           Two flat and sessile polyps were found in the                            sigmoid colon and cecum. The polyps were diminutive  in size. These polyps were removed with a cold                            snare. Resection and retrieval were complete.                            Verification of patient identification for the                            specimen was done. Estimated blood loss was minimal.                           Scattered diverticula were found in the sigmoid                            colon.                           The exam was otherwise without abnormality on                            direct and retroflexion views. Complications:            No immediate complications. Estimated Blood Loss:     Estimated blood loss was minimal. Impression:               - Two diminutive polyps in the sigmoid colon and in                            the cecum, removed with a cold snare. Resected and                             retrieved.                           - Diverticulosis in the sigmoid colon.                           - The examination was otherwise normal on direct                            and retroflexion views.                           - Personal history of colonic polyp 12 mm TV                            adenoma 2004, none 2007, 2012. Recommendation:           - Patient has a contact number available for                            emergencies. The signs and symptoms of potential                            delayed complications were discussed with  the                            patient. Return to normal activities tomorrow.                            Written discharge instructions were provided to the                            patient.                           - Resume previous diet.                           - Continue present medications.                           - Repeat colonoscopy is recommended for                            surveillance. The colonoscopy date will be                            determined after pathology results from today's                            exam become available for review. Gatha Mayer, MD 08/01/2019 10:07:46 AM This report has been signed electronically.

## 2019-08-01 NOTE — Patient Instructions (Signed)
Information on polyps and diverticulosis given to you today.  Await pathology results.  YOU HAD AN ENDOSCOPIC PROCEDURE TODAY AT THE [facility]:   Refer to the procedure report that was given to you for any specific questions about what was found during the examination.  If the procedure report does not answer your questions, please call your gastroenterologist to clarify.  If you requested that your care partner not be given the details of your procedure findings, then the procedure report has been included in a sealed envelope for you to review at your convenience later.  YOU SHOULD EXPECT: Some feelings of bloating in the abdomen. Passage of more gas than usual.  Walking can help get rid of the air that was put into your GI tract during the procedure and reduce the bloating. If you had a lower endoscopy (such as a colonoscopy or flexible sigmoidoscopy) you may notice spotting of blood in your stool or on the toilet paper. If you underwent a bowel prep for your procedure, you may not have a normal bowel movement for a few days.  Please Note:  You might notice some irritation and congestion in your nose or some drainage.  This is from the oxygen used during your procedure.  There is no need for concern and it should clear up in a day or so.  SYMPTOMS TO REPORT IMMEDIATELY:   Following lower endoscopy (colonoscopy or flexible sigmoidoscopy):  Excessive amounts of blood in the stool  Significant tenderness or worsening of abdominal pains  Swelling of the abdomen that is new, acute  Fever of 100F or higher   For urgent or emergent issues, a gastroenterologist can be reached at any hour by calling [phone].   DIET:  We do recommend a small meal at first, but then you may proceed to your regular diet.  Drink plenty of fluids but you should avoid alcoholic beverages for 24 hours.  ACTIVITY:  You should plan to take it easy for the rest of today and you should NOT DRIVE or use  heavy machinery until tomorrow (because of the sedation medicines used during the test).    FOLLOW UP: Our staff will call the number listed on your records 48-72 hours following your procedure to check on you and address any questions or concerns that you may have regarding the information given to you following your procedure. If we do not reach you, we will leave a message.  We will attempt to reach you two times.  During this call, we will ask if you have developed any symptoms of COVID 19. If you develop any symptoms (ie: fever, flu-like symptoms, shortness of breath, cough etc.) before then, please call [phone].  If you test positive for Covid 19 in the 2 weeks post procedure, please call and report this information to us.    If any biopsies were taken you will be contacted by phone or by letter within the next 1-3 weeks.  Please call us at [phone] if you have not heard about the biopsies in 3 weeks.    SIGNATURES/CONFIDENTIALITY: You and/or your care partner have signed paperwork which will be entered into your electronic medical record.  These signatures attest to the fact that that the information above on your After Visit Summary has been reviewed and is understood.  Full responsibility of the confidentiality of this discharge information lies with you and/or your care-partner. 

## 2019-08-01 NOTE — Progress Notes (Signed)
Temp by JB Vitals by CW  Pt's states no medical or surgical changes since previsit or office visit.  

## 2019-08-01 NOTE — Progress Notes (Signed)
A/ox3, pleased with MAC, report to RN 

## 2019-08-05 ENCOUNTER — Telehealth: Payer: Self-pay | Admitting: *Deleted

## 2019-08-05 NOTE — Telephone Encounter (Signed)
Message left

## 2019-08-08 ENCOUNTER — Encounter: Payer: Self-pay | Admitting: Internal Medicine

## 2019-08-08 DIAGNOSIS — Z86010 Personal history of colonic polyps: Secondary | ICD-10-CM

## 2019-08-18 IMAGING — CT CT HEART SCORING
1 of 2 series · 11 of 20 positions shown, 14 images · non-contrast
Comparison: None.

CLINICAL DATA: High cholesterol

EXAM:
CT HEART FOR CALCIUM SCORING
TECHNIQUE: CT heart was performed on a 64 channel system using prospective ECG
gating.
A non-contrast exam for calcium scoring was performed.
Note that this exam targets the heart and the chest was not imaged
in its entirety.

[Series 2: smartscore - gated 0.4 sec · axial · 0.49mm/px · z∈[-160,-30]mm · 11 of 64 slices shown, 14 images]
[im 6/64  vessel]
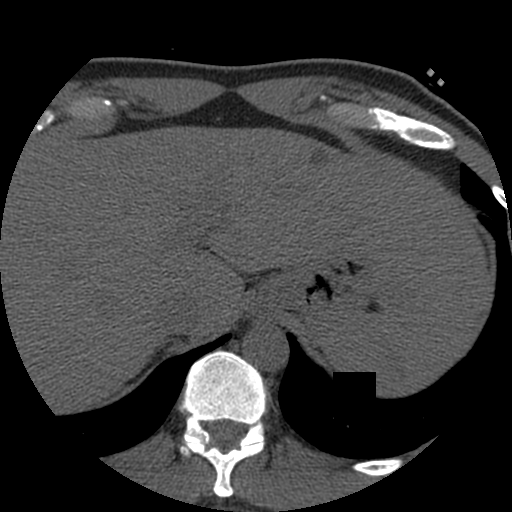
[im 6/64  lung]
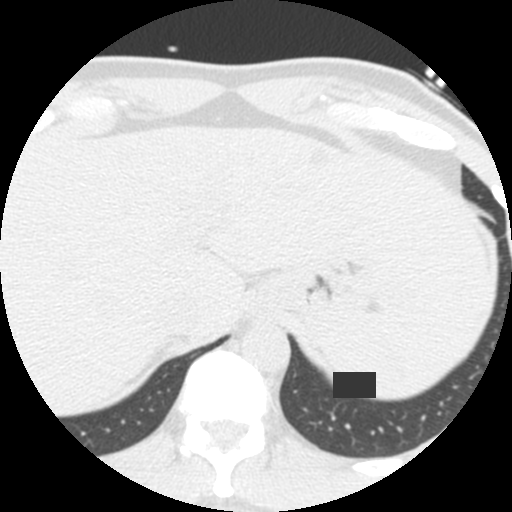
[im 11/64  vessel]
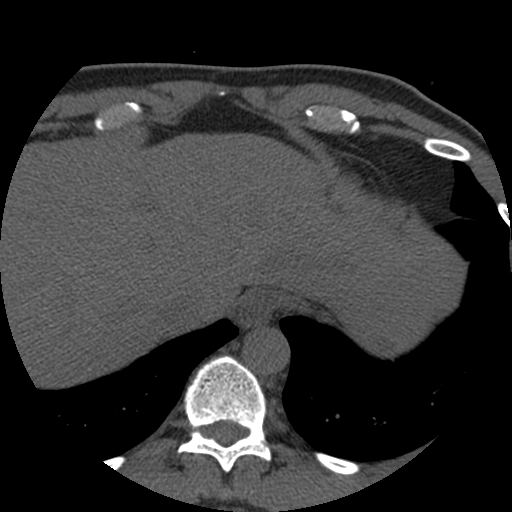
[im 16/64  vessel]
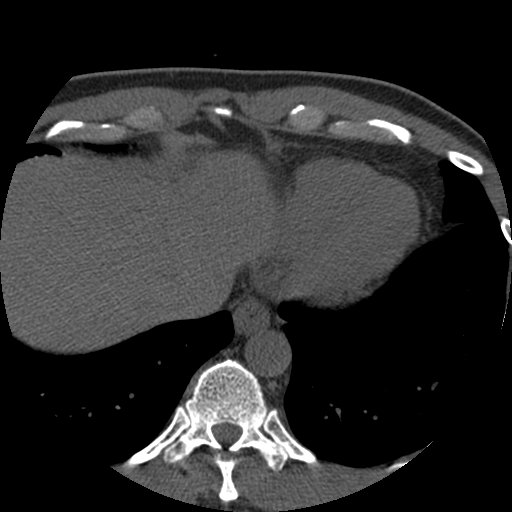
[im 22/64  vessel]
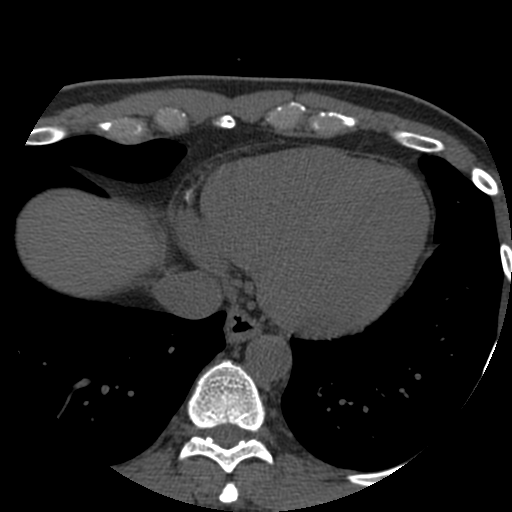
[im 27/64  vessel]
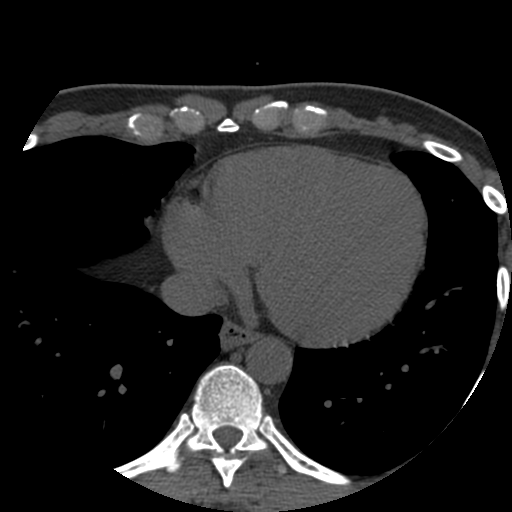
[im 27/64  lung]
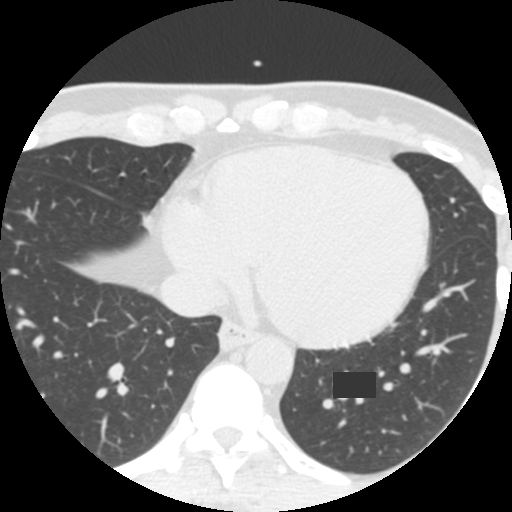
[im 32/64  vessel]
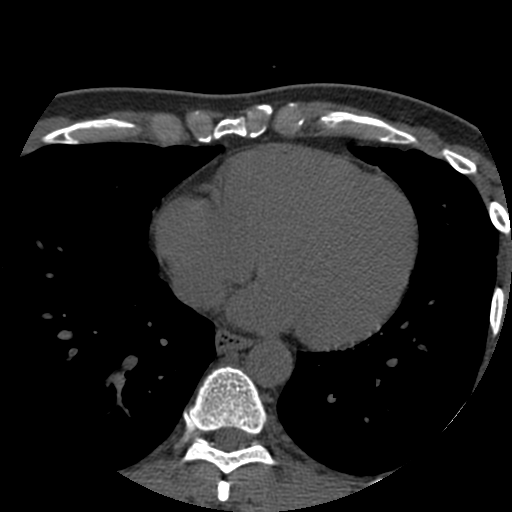
[im 37/64  vessel]
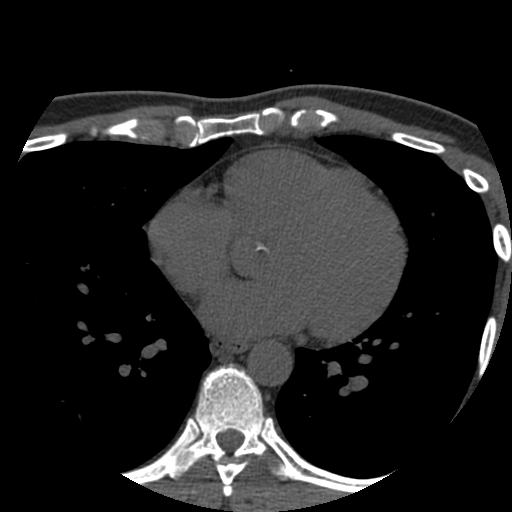
[im 43/64  vessel]
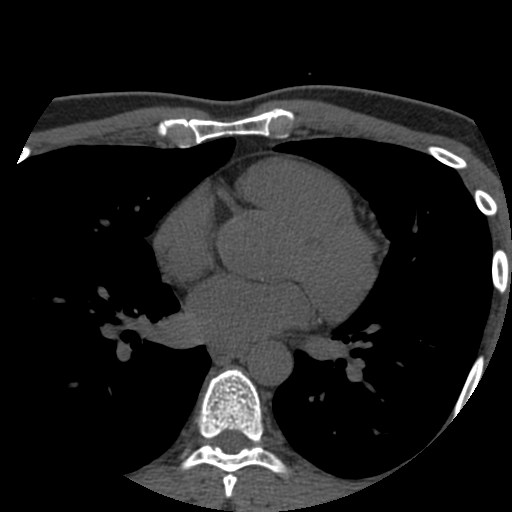
[im 48/64  vessel]
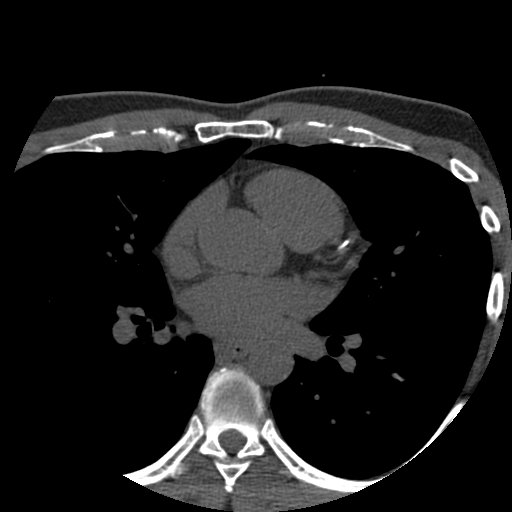
[im 48/64  lung]
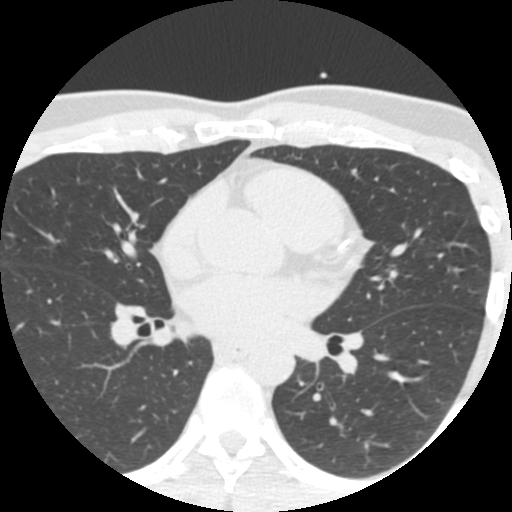
[im 53/64  vessel]
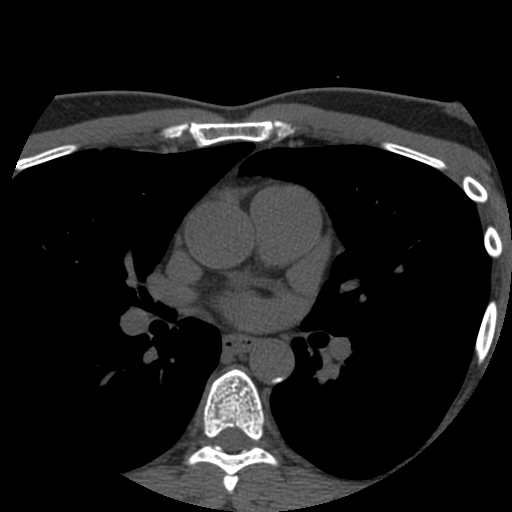
[im 58/64  vessel]
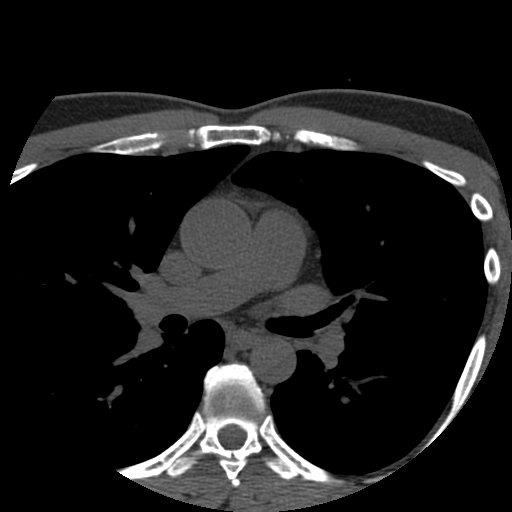

[11 of 20 positions shown; findings below may reference images not displayed]

FINDINGS: Technical quality: Good.

CORONARY CALCIUM

Total Agatston Score: 136 with calcifications noted most pronounced
in the left anterior descending coronary artery and right coronary
artery, also noted in left circumflex coronary artery.

[HOSPITAL] percentile:  92

OTHER FINDINGS:

Cardiovascular: Heart is normal size. Visualized aorta normal
caliber.

Mediastinum/Nodes: No adenopathy in the lower mediastinum or hila.

Lungs/Pleura: Visualized lungs clear.  No effusions.

Upper Abdomen: Imaging into the upper abdomen shows no acute
findings. Small low-density lesion within the lateral segment of the
left hepatic lobe measures 10 mm, likely small cyst.

Musculoskeletal: Chest wall soft tissues are unremarkable. No acute
bony abnormality.
IMPRESSION: Total coronary calcium score of 136 with calcifications in the left
anterior descending, right coronary, and left circumflex coronary
artery. This is 92nd percentile based on age and gender related
control group.

No acute or significant extracardiac abnormality.

## 2019-11-07 ENCOUNTER — Other Ambulatory Visit: Payer: Self-pay | Admitting: Surgery

## 2019-11-21 ENCOUNTER — Encounter (HOSPITAL_BASED_OUTPATIENT_CLINIC_OR_DEPARTMENT_OTHER): Payer: Self-pay | Admitting: Surgery

## 2019-11-21 ENCOUNTER — Other Ambulatory Visit: Payer: Self-pay

## 2019-11-21 NOTE — Progress Notes (Signed)
Pt reports his cardiologist is Dr Owens Shark at [facility] in [place] and LOV was 3 weeks ago. Reports cardiac clearance was sent to [facility] and that Caryl Pina has this. LM on Ashley's voice mail to see if she can fax clearance and EKG to [place]. If no EKG available will need to repeat when patient comes for CHG/ERAS.

## 2019-11-25 ENCOUNTER — Other Ambulatory Visit (HOSPITAL_COMMUNITY)
Admission: RE | Admit: 2019-11-25 | Discharge: 2019-11-25 | Disposition: A | Discharge status: Home / Self Care | Payer: PRIVATE HEALTH INSURANCE | Source: Ambulatory Visit | Attending: Surgery | Admitting: Surgery

## 2019-11-25 DIAGNOSIS — Z20822 Contact with and (suspected) exposure to covid-19: Secondary | ICD-10-CM | POA: Diagnosis not present

## 2019-11-25 DIAGNOSIS — Z01812 Encounter for preprocedural laboratory examination: Secondary | ICD-10-CM | POA: Insufficient documentation

## 2019-11-25 NOTE — Progress Notes (Signed)
Cards note/clearance received.  Chart reviewed by Dr. Valma Cava, ok to proceed as planned with surgery at [facility].

## 2019-11-26 LAB — SARS CORONAVIRUS 2 (TAT 6-24 HRS): SARS Coronavirus 2: NEGATIVE

## 2019-11-26 MED ORDER — ENSURE PRE-SURGERY PO LIQD: 296.0000 mL | Freq: Once | ORAL | Status: DC

## 2019-11-26 NOTE — Progress Notes (Signed)

## 2019-11-27 NOTE — H&P (Signed)
Cameron Hudson Appointment: 11/07/2019 11:00 AM Location: [facility] Patient #: [id number] DOB: 1965-11-09 Married / Language: Cleophus Molt / Race: White Male   History of Present Illness (Cameron Hudson A. Ninfa Linden MD; 11/07/2019 11:34 AM) The patient is a 54 year old male who presents for evaluation of gall stones.  Chief complaint: Symptomatic gallstones  This is a 54 year old gentleman referred by Dr. Virgina Jock for evaluation of symptomatic gallstones. He recently had one significant attack of biliary colic with moderate to severe right upper quadrant abdominal pain. He had no emesis and minimal nausea. He may have had some slight attacks in the past. He underwent an ultrasound showing multiple gallstones and a contracted gallbladder. There was a small liver cysts which appear benign. He is otherwise without complaints. He has had previous shoulder surgery. He has also had a cardiac stent for previous myocardial infarction. He has been doing very well and is no longer on antiplatelet therapy. He sees his cardiologist regularly in [place]   Past Surgical History Geni Bers [place], RMA; 11/07/2019 10:53 AM) Colon Polyp Removal - Colonoscopy  Oral Surgery  Shoulder Surgery  Right. Vasectomy   Diagnostic Studies History Geni Bers [place], RMA; 11/07/2019 10:53 AM) Colonoscopy  within last year  Allergies Geni Bers [place], RMA; 11/07/2019 10:54 AM) Sulfa Antibiotics  Rash. Advair Diskus *ANTIASTHMATIC AND BRONCHODILATOR AGENTS*  Hypertension Allergies Reconciled   Medication History [employer], RMA; 11/07/2019 10:55 AM) ProAir HFA (108 (90 Base)MCG/ACT Aerosol Soln, Inhalation) Active. Niacin (500MG  Tablet, Oral) Active. Zetia (10MG  Tablet, Oral) Active. Multi Vitamin (Oral) Active. Repatha (140MG /ML Soln Pref Syr, Subcutaneous) Active. Aspirin (81MG  Tablet DR, Oral) Active. Medications Reconciled  Social History Geni Bers [place], RMA;  11/07/2019 10:53 AM) Alcohol use  Occasional alcohol use. Caffeine use  Carbonated beverages, Coffee, Tea. No drug use  Tobacco use  Never smoker.  Family History Geni Bers [place], RMA; 11/07/2019 10:53 AM) Depression  Mother. Hypertension  Father.  Other Problems Geni Bers [place], RMA; 11/07/2019 10:53 AM) Hypercholesterolemia  Myocardial infarction     Review of Systems Geni Bers Haggett RMA; 11/07/2019 10:53 AM) General Not Present- Appetite Loss, Chills, Fatigue, Fever, Night Sweats, Weight Gain and Weight Loss. Skin Not Present- Change in Wart/Mole, Dryness, Hives, Jaundice, New Lesions, Non-Healing Wounds, Rash and Ulcer. HEENT Present- Seasonal Allergies. Not Present- Earache, Hearing Loss, Hoarseness, Nose Bleed, Oral Ulcers, Ringing in the Ears, Sinus Pain, Sore Throat, Visual Disturbances, Wears glasses/contact lenses and Yellow Eyes. Breast Not Present- Breast Mass, Breast Pain, Nipple Discharge and Skin Changes. Cardiovascular Not Present- Chest Pain, Difficulty Breathing Lying Down, Leg Cramps, Palpitations, Rapid Heart Rate, Shortness of Breath and Swelling of Extremities. Gastrointestinal Present- Indigestion. Not Present- Abdominal Pain, Bloating, Bloody Stool, Change in Bowel Habits, Chronic diarrhea, Constipation, Difficulty Swallowing, Excessive gas, Gets full quickly at meals, Hemorrhoids, Nausea, Rectal Pain and Vomiting. Male Genitourinary Not Present- Blood in Urine, Change in Urinary Stream, Frequency, Impotence, Nocturia, Painful Urination, Urgency and Urine Leakage. Musculoskeletal Not Present- Back Pain, Joint Pain, Joint Stiffness, Muscle Pain, Muscle Weakness and Swelling of Extremities. Neurological Not Present- Decreased Memory, Fainting, Headaches, Numbness, Seizures, Tingling, Tremor, Trouble walking and Weakness. Psychiatric Not Present- Anxiety, Bipolar, Change in Sleep Pattern, Depression, Fearful and Frequent crying. Endocrine Not Present-  Cold Intolerance, Excessive Hunger, Hair Changes, Heat Intolerance, Hot flashes and New Diabetes. Hematology Not Present- Blood Thinners, Easy Bruising, Excessive bleeding, Gland problems, HIV and Persistent Infections.  Vitals [employer] Haggett RMA; 11/07/2019 10:56 AM) 11/07/2019 10:55 AM Weight: 163.6 lb Height: 68in Body Surface Area: 1.88 m  Body Mass Index: 24.88 kg/m  Temp.: 98.13F (Temporal)  Pulse: 69 (Regular)  P.OX: 98% (Room air) BP: 114/76(Sitting, Left Arm, Standard)       Physical Exam (Izabell Schalk A. Ninfa Linden MD; 11/07/2019 11:35 AM) The physical exam findings are as follows: Note: He appears well in exam.  His abdomen is soft and nontender today. There are no masses and no hepatomegaly    Assessment & Plan (Yanet Balliet A. Ninfa Linden MD; 11/07/2019 11:36 AM) SYMPTOMATIC CHOLELITHIASIS (K80.20) Impression: Again, this is a patient with symptomatic gallstones . We discussed the diagnosis in detail. We discussed the reasoning for proceeding with a laparoscopic cholecystectomy. I discussed the surgical procedure in detail and gave him literature regarding this. We discussed the risk of surgery which includes but is not limited to bleeding, infection, injury to surrounding structures, bile leak, the need to convert to an open procedure, cardiopulmonary issues, postoperative recovery, etc. He understands and wished to proceed with surgery which will be scheduled.

## 2019-11-28 ENCOUNTER — Ambulatory Visit (HOSPITAL_BASED_OUTPATIENT_CLINIC_OR_DEPARTMENT_OTHER)
Admission: RE | Admit: 2019-11-28 | Discharge: 2019-11-28 | Disposition: A | Discharge status: Home / Self Care | Payer: PRIVATE HEALTH INSURANCE | Attending: Surgery | Admitting: Surgery

## 2019-11-28 ENCOUNTER — Encounter (HOSPITAL_BASED_OUTPATIENT_CLINIC_OR_DEPARTMENT_OTHER)
Admission: RE | Disposition: A | Discharge status: Home / Self Care | Payer: Self-pay | Source: Home / Self Care | Attending: Surgery

## 2019-11-28 ENCOUNTER — Ambulatory Visit (HOSPITAL_BASED_OUTPATIENT_CLINIC_OR_DEPARTMENT_OTHER): Payer: PRIVATE HEALTH INSURANCE | Admitting: Certified Registered"

## 2019-11-28 ENCOUNTER — Other Ambulatory Visit: Payer: Self-pay

## 2019-11-28 ENCOUNTER — Encounter (HOSPITAL_BASED_OUTPATIENT_CLINIC_OR_DEPARTMENT_OTHER): Payer: Self-pay | Admitting: Surgery

## 2019-11-28 DIAGNOSIS — I252 Old myocardial infarction: Secondary | ICD-10-CM | POA: Diagnosis not present

## 2019-11-28 DIAGNOSIS — K8010 Calculus of gallbladder with chronic cholecystitis without obstruction: Secondary | ICD-10-CM | POA: Diagnosis not present

## 2019-11-28 DIAGNOSIS — Z955 Presence of coronary angioplasty implant and graft: Secondary | ICD-10-CM | POA: Diagnosis not present

## 2019-11-28 DIAGNOSIS — K8020 Calculus of gallbladder without cholecystitis without obstruction: Secondary | ICD-10-CM | POA: Diagnosis present

## 2019-11-28 DIAGNOSIS — Z7982 Long term (current) use of aspirin: Secondary | ICD-10-CM | POA: Diagnosis not present

## 2019-11-28 HISTORY — PX: CHOLECYSTECTOMY: SHX55

## 2019-11-28 SURGERY — LAPAROSCOPIC CHOLECYSTECTOMY
Anesthesia: General

## 2019-11-28 MED ORDER — CHLORHEXIDINE GLUCONATE CLOTH 2 % EX PADS: 6.0000 | MEDICATED_PAD | Freq: Once | CUTANEOUS | Status: DC

## 2019-11-28 MED ORDER — MIDAZOLAM HCL 5 MG/5ML IJ SOLN
INTRAMUSCULAR | Status: DC | PRN
  Administered 2019-11-28: 2 mg via INTRAVENOUS

## 2019-11-28 MED ORDER — CEFAZOLIN SODIUM-DEXTROSE 2-4 GM/100ML-% IV SOLN
2.0000 g | INTRAVENOUS | Status: AC
  Administered 2019-11-28: 2 g via INTRAVENOUS

## 2019-11-28 MED ORDER — GABAPENTIN 300 MG PO CAPS
300.0000 mg | ORAL_CAPSULE | ORAL | Status: AC
  Administered 2019-11-28: 300 mg via ORAL

## 2019-11-28 MED ORDER — BUPIVACAINE-EPINEPHRINE (PF) 0.5% -1:200000 IJ SOLN
INTRAMUSCULAR | Status: DC | PRN
  Administered 2019-11-28: 20 mL

## 2019-11-28 MED ORDER — MIDAZOLAM HCL 2 MG/2ML IJ SOLN
INTRAMUSCULAR | Status: AC
  Filled 2019-11-28: qty 2

## 2019-11-28 MED ORDER — ACETAMINOPHEN 500 MG PO TABS
1000.0000 mg | ORAL_TABLET | ORAL | Status: AC
  Administered 2019-11-28: 1000 mg via ORAL

## 2019-11-28 MED ORDER — LIDOCAINE HCL (CARDIAC) PF 100 MG/5ML IV SOSY
PREFILLED_SYRINGE | INTRAVENOUS | Status: DC | PRN
  Administered 2019-11-28: 60 mg via INTRAVENOUS

## 2019-11-28 MED ORDER — PROPOFOL 10 MG/ML IV BOLUS
INTRAVENOUS | Status: DC | PRN
  Administered 2019-11-28: 150 mg via INTRAVENOUS

## 2019-11-28 MED ORDER — OXYCODONE HCL 5 MG PO TABS: 5.0000 mg | ORAL_TABLET | Freq: Four times a day (QID) | ORAL | 0 refills | Status: DC | PRN

## 2019-11-28 MED ORDER — ONDANSETRON HCL 4 MG/2ML IJ SOLN
INTRAMUSCULAR | Status: DC | PRN
  Administered 2019-11-28: 4 mg via INTRAVENOUS

## 2019-11-28 MED ORDER — HYDROMORPHONE HCL 1 MG/ML IJ SOLN
INTRAMUSCULAR | Status: AC
  Filled 2019-11-28: qty 0.5

## 2019-11-28 MED ORDER — SODIUM CHLORIDE 0.9 % IR SOLN
Status: DC | PRN
  Administered 2019-11-28: 1000 mL

## 2019-11-28 MED ORDER — KETOROLAC TROMETHAMINE 30 MG/ML IJ SOLN
30.0000 mg | Freq: Once | INTRAMUSCULAR | Status: AC | PRN
  Administered 2019-11-28: 30 mg via INTRAVENOUS

## 2019-11-28 MED ORDER — CEFAZOLIN SODIUM-DEXTROSE 2-4 GM/100ML-% IV SOLN
INTRAVENOUS | Status: AC
  Filled 2019-11-28: qty 100

## 2019-11-28 MED ORDER — ROCURONIUM BROMIDE 100 MG/10ML IV SOLN
INTRAVENOUS | Status: DC | PRN
  Administered 2019-11-28: 50 mg via INTRAVENOUS

## 2019-11-28 MED ORDER — DIPHENHYDRAMINE HCL 50 MG/ML IJ SOLN
INTRAMUSCULAR | Status: DC | PRN
  Administered 2019-11-28: 6.25 mg via INTRAVENOUS

## 2019-11-28 MED ORDER — GABAPENTIN 300 MG PO CAPS
ORAL_CAPSULE | ORAL | Status: AC
  Filled 2019-11-28: qty 1

## 2019-11-28 MED ORDER — HYDROMORPHONE HCL 1 MG/ML IJ SOLN
0.2500 mg | INTRAMUSCULAR | Status: DC | PRN
  Administered 2019-11-28: 0.25 mg via INTRAVENOUS

## 2019-11-28 MED ORDER — SUGAMMADEX SODIUM 200 MG/2ML IV SOLN
INTRAVENOUS | Status: DC | PRN
  Administered 2019-11-28: 200 mg via INTRAVENOUS

## 2019-11-28 MED ORDER — FENTANYL CITRATE (PF) 100 MCG/2ML IJ SOLN
INTRAMUSCULAR | Status: DC | PRN
  Administered 2019-11-28 (×2): 50 ug via INTRAVENOUS

## 2019-11-28 MED ORDER — KETOROLAC TROMETHAMINE 30 MG/ML IJ SOLN
INTRAMUSCULAR | Status: AC
  Filled 2019-11-28: qty 1

## 2019-11-28 MED ORDER — ACETAMINOPHEN 500 MG PO TABS
ORAL_TABLET | ORAL | Status: AC
  Filled 2019-11-28: qty 2

## 2019-11-28 MED ORDER — DEXAMETHASONE SODIUM PHOSPHATE 4 MG/ML IJ SOLN
INTRAMUSCULAR | Status: DC | PRN
  Administered 2019-11-28: 10 mg via INTRAVENOUS

## 2019-11-28 MED ORDER — FENTANYL CITRATE (PF) 100 MCG/2ML IJ SOLN
INTRAMUSCULAR | Status: AC
  Filled 2019-11-28: qty 2

## 2019-11-28 MED ORDER — LACTATED RINGERS IV SOLN: INTRAVENOUS | Status: DC

## 2019-11-28 SURGICAL SUPPLY — 43 items
ADH SKN CLS APL DERMABOND .7 (GAUZE/BANDAGES/DRESSINGS) ×1
APL PRP STRL LF DISP 70% ISPRP (MISCELLANEOUS) ×1
APPLIER CLIP 5 13 M/L LIGAMAX5 (MISCELLANEOUS) ×3
APR CLP MED LRG 5 ANG JAW (MISCELLANEOUS) ×1
BAG SPEC RTRVL LRG 6X4 10 (ENDOMECHANICALS) ×1
BLADE CLIPPER SURG (BLADE) ×2 IMPLANT
CHLORAPREP W/TINT 26 (MISCELLANEOUS) ×3 IMPLANT
CLIP APPLIE 5 13 M/L LIGAMAX5 (MISCELLANEOUS) ×1 IMPLANT
COVER MAYO STAND STRL (DRAPES) IMPLANT
COVER WAND RF STERILE (DRAPES) IMPLANT
DECANTER SPIKE VIAL GLASS SM (MISCELLANEOUS) ×3 IMPLANT
DERMABOND ADVANCED (GAUZE/BANDAGES/DRESSINGS) ×2
DERMABOND ADVANCED .7 DNX12 (GAUZE/BANDAGES/DRESSINGS) ×1 IMPLANT
DRAPE C-ARM 42X72 X-RAY (DRAPES) IMPLANT
DRAPE LAPAROSCOPIC ABDOMINAL (DRAPES) ×3 IMPLANT
ELECT REM PT RETURN 9FT ADLT (ELECTROSURGICAL) ×3
ELECTRODE REM PT RTRN 9FT ADLT (ELECTROSURGICAL) ×1 IMPLANT
GLOVE BIO SURGEON STRL SZ 6.5 (GLOVE) ×2 IMPLANT
GLOVE BIO SURGEONS STRL SZ 6.5 (GLOVE) ×2
GLOVE BIOGEL PI IND STRL 6.5 (GLOVE) IMPLANT
GLOVE BIOGEL PI INDICATOR 6.5 (GLOVE) ×2
GLOVE SURG SIGNA 7.5 PF LTX (GLOVE) ×3 IMPLANT
GOWN STRL REUS W/ TWL LRG LVL3 (GOWN DISPOSABLE) ×2 IMPLANT
GOWN STRL REUS W/ TWL XL LVL3 (GOWN DISPOSABLE) ×1 IMPLANT
GOWN STRL REUS W/TWL LRG LVL3 (GOWN DISPOSABLE) ×9
GOWN STRL REUS W/TWL XL LVL3 (GOWN DISPOSABLE) ×3
NS IRRIG 1000ML POUR BTL (IV SOLUTION) ×3 IMPLANT
POUCH SPECIMEN RETRIEVAL 10MM (ENDOMECHANICALS) ×3 IMPLANT
SCISSORS LAP 5X35 DISP (ENDOMECHANICALS) ×3 IMPLANT
SET BASIN DAY SURGERY F.S. (CUSTOM PROCEDURE TRAY) ×3 IMPLANT
SET CHOLANGIOGRAPH 5 50 .035 (SET/KITS/TRAYS/PACK) IMPLANT
SET IRRIG TUBING LAPAROSCOPIC (IRRIGATION / IRRIGATOR) ×3 IMPLANT
SET TUBE SMOKE EVAC HIGH FLOW (TUBING) ×3 IMPLANT
SLEEVE ENDOPATH XCEL 5M (ENDOMECHANICALS) ×6 IMPLANT
SLEEVE SCD COMPRESS KNEE MED (MISCELLANEOUS) ×3 IMPLANT
SPECIMEN JAR SMALL (MISCELLANEOUS) ×3 IMPLANT
SUT MON AB 4-0 PC3 18 (SUTURE) ×3 IMPLANT
TOWEL GREEN STERILE FF (TOWEL DISPOSABLE) ×3 IMPLANT
TRAY LAPAROSCOPIC (CUSTOM PROCEDURE TRAY) ×3 IMPLANT
TROCAR XCEL BLUNT TIP 100MML (ENDOMECHANICALS) ×3 IMPLANT
TROCAR XCEL NON-BLD 5MMX100MML (ENDOMECHANICALS) ×3 IMPLANT
TUBE CONNECTING 20'X1/4 (TUBING) ×1
TUBE CONNECTING 20X1/4 (TUBING) ×2 IMPLANT

## 2019-11-28 NOTE — Transfer of Care (Signed)
Immediate Anesthesia Transfer of Care Note  Patient: Cameron Hudson  Procedure(s) Performed: LAPAROSCOPIC CHOLECYSTECTOMY (N/A )  Patient Location: PACU  Anesthesia Type:General  Level of Consciousness: awake and drowsy  Airway & Oxygen Therapy: Patient Spontanous Breathing and Patient connected to face mask oxygen  Post-op Assessment: Report given to RN and Post -op Vital signs reviewed and stable  Post vital signs: Reviewed and stable  Last Vitals:  Vitals Value Taken Time  BP 148/96 11/28/19 1526  Temp    Pulse 59 11/28/19 1528  Resp 25 11/28/19 1528  SpO2 97 % 11/28/19 1528  Vitals shown include unvalidated device data.  Last Pain:  Vitals:   11/28/19 1303  TempSrc: Oral  PainSc: 0-No pain         Complications: No apparent anesthesia complications

## 2019-11-28 NOTE — Interval H&P Note (Signed)
History and Physical Interval Note: no change in H and P  11/28/2019 2:10 PM  Cameron Hudson  has presented today for surgery, with the diagnosis of SYMPTOMATIC GALLSTONES.  The various methods of treatment have been discussed with the patient and family. After consideration of risks, benefits and other options for treatment, the patient has consented to  Procedure(s): LAPAROSCOPIC CHOLECYSTECTOMY (N/A) as a surgical intervention.  The patient's history has been reviewed, patient examined, no change in status, stable for surgery.  I have reviewed the patient's chart and labs.  Questions were answered to the patient's satisfaction.     Coralie Keens

## 2019-11-28 NOTE — Anesthesia Preprocedure Evaluation (Signed)
Anesthesia Evaluation  Patient identified by MRN, date of birth, ID band Patient awake    Reviewed: Allergy & Precautions, NPO status , Patient's Chart, lab work & pertinent test results  Airway Mallampati: II  TM Distance: >3 FB Neck ROM: Full    Dental no notable dental hx.    Pulmonary neg pulmonary ROS,    Pulmonary exam normal breath sounds clear to auscultation       Cardiovascular + CAD, + Past MI and + Cardiac Stents  Normal cardiovascular exam Rhythm:Regular Rate:Normal     Neuro/Psych negative neurological ROS  negative psych ROS   GI/Hepatic negative GI ROS, Neg liver ROS,   Endo/Other  negative endocrine ROS  Renal/GU negative Renal ROS  negative genitourinary   Musculoskeletal negative musculoskeletal ROS (+)   Abdominal   Peds negative pediatric ROS (+)  Hematology negative hematology ROS (+)   Anesthesia Other Findings   Reproductive/Obstetrics negative OB ROS                             Anesthesia Physical Anesthesia Plan  ASA: III  Anesthesia Plan: General   Post-op Pain Management:    Induction: Intravenous  PONV Risk Score and Plan: 2 and Ondansetron, Dexamethasone and Treatment may vary due to age or medical condition  Airway Management Planned: Oral ETT  Additional Equipment:   Intra-op Plan:   Post-operative Plan: Extubation in OR  Informed Consent: I have reviewed the patients History and Physical, chart, labs and discussed the procedure including the risks, benefits and alternatives for the proposed anesthesia with the patient or authorized representative who has indicated his/her understanding and acceptance.     Dental advisory given  Plan Discussed with: CRNA and Surgeon  Anesthesia Plan Comments:         Anesthesia Quick Evaluation

## 2019-11-28 NOTE — Anesthesia Procedure Notes (Signed)
Procedure Name: Intubation Date/Time: 11/28/2019 2:36 PM Performed by: Willa Frater, CRNA Pre-anesthesia Checklist: Patient identified, Emergency Drugs available, Suction available and Patient being monitored Patient Re-evaluated:Patient Re-evaluated prior to induction Oxygen Delivery Method: Circle system utilized Preoxygenation: Pre-oxygenation with 100% oxygen Induction Type: IV induction Ventilation: Mask ventilation without difficulty Laryngoscope Size: Mac and 3 Grade View: Grade II Tube type: Oral Tube size: 7.0 mm Number of attempts: 1 Airway Equipment and Method: Stylet and Oral airway Placement Confirmation: ETT inserted through vocal cords under direct vision,  positive ETCO2 and breath sounds checked- equal and bilateral Secured at: 22 cm Tube secured with: Tape Dental Injury: Teeth and Oropharynx as per pre-operative assessment

## 2019-11-28 NOTE — Anesthesia Postprocedure Evaluation (Signed)
Anesthesia Post Note  Patient: Cameron Hudson  Procedure(s) Performed: LAPAROSCOPIC CHOLECYSTECTOMY (N/A )     Patient location during evaluation: PACU Anesthesia Type: General Level of consciousness: awake and alert Pain management: pain level controlled Vital Signs Assessment: post-procedure vital signs reviewed and stable Respiratory status: spontaneous breathing, nonlabored ventilation, respiratory function stable and patient connected to nasal cannula oxygen Cardiovascular status: blood pressure returned to baseline and stable Postop Assessment: no apparent nausea or vomiting Anesthetic complications: no    Last Vitals:  Vitals:   11/28/19 1600 11/28/19 1607  BP: (!) 166/93   Pulse: (!) 54 (!) 57  Resp: 20 15  Temp:    SpO2: 94% 96%    Last Pain:  Vitals:   11/28/19 1555  TempSrc:   PainSc: 4                  Denine Brotz S

## 2019-11-28 NOTE — Op Note (Addendum)
Laparoscopic Cholecystectomy Procedure Note  Indications: This patient presents with symptomatic gallbladder disease and will undergo laparoscopic cholecystectomy.  Pre-operative Diagnosis: symptomatic cholelithiasis  Post-operative Diagnosis: Same  Surgeon: Coralie Keens   Assistants: 0  Anesthesia: General endotracheal anesthesia  ASA Class: 2  Procedure Details  The patient was seen again in the Holding Room. The risks, benefits, complications, treatment options, and expected outcomes were discussed with the patient. The possibilities of reaction to medication, pulmonary aspiration, perforation of viscus, bleeding, recurrent infection, finding a normal gallbladder, the need for additional procedures, failure to diagnose a condition, the possible need to convert to an open procedure, and creating a complication requiring transfusion or operation were discussed with the patient. The likelihood of improving the patient's symptoms with return to their baseline status is good.  The patient and/or family concurred with the proposed plan, giving informed consent. The site of surgery properly noted. The patient was taken to Operating Room, identified as Cameron Hudson and the procedure verified as Laparoscopic Cholecystectomy with Intraoperative Cholangiogram. A Time Out was held and the above information confirmed.  Prior to the induction of general anesthesia, antibiotic prophylaxis was administered. General endotracheal anesthesia was then administered and tolerated well. After the induction, the abdomen was prepped with Chloraprep and draped in sterile fashion. The patient was positioned in the supine position.  Local anesthetic agent was injected into the skin near the umbilicus and an incision made. We dissected down to the abdominal fascia with blunt dissection.  The fascia was incised vertically and we entered the peritoneal cavity bluntly.  A pursestring suture of 0-Vicryl was placed  around the fascial opening.  The Hasson cannula was inserted and secured with the stay suture.  Pneumoperitoneum was then created with CO2 and tolerated well without any adverse changes in the patient's vital signs. A 5-mm port was placed in the subxiphoid position.  Two 5-mm ports were placed in the right upper quadrant. All skin incisions were infiltrated with a local anesthetic agent before making the incision and placing the trocars.   We positioned the patient in reverse Trendelenburg, tilted slightly to the patient's left.  The gallbladder was identified, the fundus grasped and retracted cephalad. The gallbladder was contracted and thick walled. Adhesions were lysed bluntly and with the electrocautery where indicated, taking care not to injure any adjacent organs or viscus. The infundibulum was grasped and retracted laterally, exposing the peritoneum overlying the triangle of Calot. This was then divided and exposed in a blunt fashion. The cystic duct was clearly identified and bluntly dissected circumferentially. A critical view of the cystic duct and cystic artery was obtained.  The cystic duct was then ligated with clips and divided. The cystic artery was, dissected free, ligated with clips and divided as well.   The gallbladder was dissected from the liver bed in retrograde fashion with the electrocautery. The gallbladder was removed and placed in an Endocatch sac. The liver bed was irrigated and inspected. Hemostasis was achieved with the electrocautery. Copious irrigation was utilized and was repeatedly aspirated until clear.  The gallbladder and Endocatch sac were then removed through the umbilical port site.  The pursestring suture was used to close the umbilical fascia.  I also placed a second 0 vicryl suture at the fascial as well.  We again inspected the right upper quadrant for hemostasis.  Pneumoperitoneum was released as we removed the trocars.  4-0 Monocryl was used to close the skin.    Skin glue was then applied. The  patient was then extubated and brought to the recovery room in stable condition. Instrument, sponge, and needle counts were correct at closure and at the conclusion of the case.   Findings: Chronic Cholecystitis with Cholelithiasis  Estimated Blood Loss: Minimal         Drains: 0         Specimens: Gallbladder           Complications: None; patient tolerated the procedure well.         Disposition: PACU - hemodynamically stable.         Condition: stable

## 2019-11-28 NOTE — Discharge Instructions (Signed)
Next dose of Tylenol can be given at 7:30pm if needed. Next dose of Motrin/Ibuprofen can be given at 10:00pm if needed.    [facility] ______[facility] LAPAROSCOPIC SURGERY: POST OP INSTRUCTIONS Always review your discharge instruction sheet given to you by the facility where your surgery was performed. IF YOU HAVE DISABILITY OR FAMILY LEAVE FORMS, YOU MUST BRING THEM TO THE OFFICE FOR PROCESSING.   DO NOT GIVE THEM TO YOUR DOCTOR.  1. A prescription for pain medication may be given to you upon discharge.  Take your pain medication as prescribed, if needed.  If narcotic pain medicine is not needed, then you may take acetaminophen (Tylenol) or ibuprofen (Advil) as needed. 2. Take your usually prescribed medications unless otherwise directed. 3. If you need a refill on your pain medication, please contact your pharmacy.  They will contact our office to request authorization. Prescriptions will not be filled after 5pm or on week-ends. 4. You should follow a light diet the first few days after arrival home, such as soup and crackers, etc.  Be sure to include lots of fluids daily. 5. Most patients will experience some swelling and bruising in the area of the incisions.  Ice packs will help.  Swelling and bruising can take several days to resolve.  6. It is common to experience some constipation if taking pain medication after surgery.  Increasing fluid intake and taking a stool softener (such as Colace) will usually help or prevent this problem from occurring.  A mild laxative (Milk of Magnesia or Miralax) should be taken according to package instructions if there are no bowel movements after 48 hours. 7. Unless discharge instructions indicate otherwise, you may remove your bandages 24-48 hours after surgery, and you may shower at that time.  You may have steri-strips (small skin tapes) in place directly over the incision.  These strips should be left on the skin for 7-10 days.  If your  surgeon used skin glue on the incision, you may shower in 24 hours.  The glue will flake off over the next 2-3 weeks.  Any sutures or staples will be removed at the office during your follow-up visit. 8. ACTIVITIES:  You may resume regular (light) daily activities beginning the next day--such as daily self-care, walking, climbing stairs--gradually increasing activities as tolerated.  You may have sexual intercourse when it is comfortable.  Refrain from any heavy lifting or straining until approved by your doctor. a. You may drive when you are no longer taking prescription pain medication, you can comfortably wear a seatbelt, and you can safely maneuver your car and apply brakes. b. RETURN TO WORK:  __________________________________________________________ 9. You should see your doctor in the office for a follow-up appointment approximately 2-3 weeks after your surgery.  Make sure that you call for this appointment within a day or two after you arrive home to insure a convenient appointment time. 10. OTHER INSTRUCTIONS:OK TO SHOWER STARTING TOMORROW 11. ICE PACK, TYLENOL, AND IBUPROFEN ALSO FOR PAIN 12. NO LIFTING MORE THAN 15 TO 20 POUNDS FOR 2 WEEKS __________________________________________________________________________________________________________________________ __________________________________________________________________________________________________________________________ WHEN TO CALL YOUR DOCTOR: 1. Fever over 101.0 2. Inability to urinate 3. Continued bleeding from incision. 4. Increased pain, redness, or drainage from the incision. 5. Increasing abdominal pain  The clinic staff is available to answer your questions during regular business hours.  Please don't hesitate to call and ask to speak to one of the nurses for clinical concerns.  If you have a medical emergency, go to  the nearest emergency room or call 911.  A surgeon from [facility] is always on call at  the hospital. [address] [place], [place], [place]  [address] ? [address], [place], [place]   [address] [phone] ? [phone] ? FAX [phone] Web site: [email or web address]    Post Anesthesia Home Care Instructions  Activity: Get plenty of rest for the remainder of the day. A responsible individual must stay with you for 24 hours following the procedure.  For the next 24 hours, DO NOT: -Drive a car -Paediatric nurse -Drink alcoholic beverages -Take any medication unless instructed by your physician -Make any legal decisions or sign important papers.  Meals: Start with liquid foods such as gelatin or soup. Progress to regular foods as tolerated. Avoid greasy, spicy, heavy foods. If nausea and/or vomiting occur, drink only clear liquids until the nausea and/or vomiting subsides. Call your physician if vomiting continues.  Special Instructions/Symptoms: Your throat may feel dry or sore from the anesthesia or the breathing tube placed in your throat during surgery. If this causes discomfort, gargle with warm salt water. The discomfort should disappear within 24 hours.

## 2019-12-01 ENCOUNTER — Encounter: Payer: Self-pay | Admitting: *Deleted

## 2019-12-01 LAB — SURGICAL PATHOLOGY

## 2019-12-01 NOTE — Addendum Note (Signed)
Addendum  created 12/01/19 1235 by Willa Frater, CRNA   Charge Capture section accepted

## 2020-01-16 ENCOUNTER — Telehealth: Payer: Self-pay | Admitting: Physical Medicine and Rehabilitation

## 2020-01-16 NOTE — Telephone Encounter (Signed)
Pt called stating he would like to set up a consultation.  [phone]

## 2020-01-19 NOTE — Telephone Encounter (Signed)
Left message #1 to schedule new patient OV. 

## 2020-01-23 NOTE — Telephone Encounter (Signed)
Left message #2 to schedule new patient OV.

## 2020-02-02 NOTE — Telephone Encounter (Signed)
Patient has not returned calls. Closing encounter. 

## 2022-09-13 ENCOUNTER — Ambulatory Visit: Payer: PRIVATE HEALTH INSURANCE | Attending: Internal Medicine | Admitting: Occupational Therapy

## 2022-09-13 ENCOUNTER — Other Ambulatory Visit: Payer: Self-pay

## 2022-09-13 ENCOUNTER — Ambulatory Visit: Payer: PRIVATE HEALTH INSURANCE

## 2022-09-13 DIAGNOSIS — I69318 Other symptoms and signs involving cognitive functions following cerebral infarction: Secondary | ICD-10-CM | POA: Diagnosis present

## 2022-09-13 DIAGNOSIS — R4701 Aphasia: Secondary | ICD-10-CM | POA: Insufficient documentation

## 2022-09-13 DIAGNOSIS — R208 Other disturbances of skin sensation: Secondary | ICD-10-CM | POA: Insufficient documentation

## 2022-09-13 DIAGNOSIS — I69353 Hemiplegia and hemiparesis following cerebral infarction affecting right non-dominant side: Secondary | ICD-10-CM | POA: Diagnosis present

## 2022-09-13 DIAGNOSIS — R278 Other lack of coordination: Secondary | ICD-10-CM | POA: Insufficient documentation

## 2022-09-13 DIAGNOSIS — R41840 Attention and concentration deficit: Secondary | ICD-10-CM | POA: Insufficient documentation

## 2022-09-13 NOTE — Therapy (Signed)
OUTPATIENT OCCUPATIONAL THERAPY NEURO EVALUATION  Patient Name: Cameron Hudson MRN: [id number] DOB:1965/07/02, 57 y.o., male Today's Date: 09/13/2022  PCP: Shon Baton, MD REFERRING PROVIDER: Shon Baton, MD  END OF SESSION:  OT End of Session - 09/13/22 1458     Visit Number 1    Number of Visits 17    Date for OT Re-Evaluation 11/10/22    Authorization Type [employer] / [employer] [id number]    Authorization - Number of Visits 20   OT/PT combined   OT Start Time 1449    OT Stop Time 1537    OT Time Calculation (min) 48 min             Past Medical History:  Diagnosis Date   Allergy    Asthma    CAD S/P percutaneous coronary angioplasty 06/20/2017   Inferior STEMI - 100% dRCA --> DES PCI with Synergy 3.0 x 24 (3.3 mm). Mild 10-30% disease in p-mRCA, Cx & LAD.  EF Normal.    Hemorrhoids    Hx of adenomatous polyp of colon 08/01/2019   Hyperlipidemia    IBS (irritable bowel syndrome)    Personal history of colonic polyps 2004   12 mm adenoma   Seasonal allergies    STEMI involving oth coronary artery of inferior wall ([place]) 06/20/2017   12/18 PCI/DESx1 to dRCA, normal EF   Past Surgical History:  Procedure Laterality Date   CHOLECYSTECTOMY N/A 11/28/2019   Procedure: LAPAROSCOPIC CHOLECYSTECTOMY;  Surgeon: Coralie Keens, MD;  Location: [facility];  Service: General;  Laterality: N/A;   COLONOSCOPY     COLONOSCOPY W/ POLYPECTOMY  08/27/2002; 08/30/2005; 01/06/2011   47mm adenoma, diverticulosis, hemorrhoids; normal;  diverticulosis and small hemorrhoids   CORONARY STENT INTERVENTION N/A 06/20/2017   Procedure: CORONARY STENT INTERVENTION;  Surgeon: Troy Sine, MD;  Location: [facility];  Service: Cardiovascular;  Laterality: N/A;   LEFT HEART CATH AND CORONARY ANGIOGRAPHY N/A 06/20/2017   Procedure: LEFT HEART CATH AND CORONARY ANGIOGRAPHY;  Surgeon: Troy Sine, MD;  Location: [facility];  Service: Cardiovascular;  Laterality: N/A;    NASAL SINUS SURGERY  Dec 2011   POLYPECTOMY     Patient Active Problem List   Diagnosis Date Noted   Hx of adenomatous polyp of colon 08/01/2019   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   IFG (impaired fasting glucose) 08/11/2017   Hypogonadism, male 08/11/2017   Statin intolerance 08/09/2017   Encounter for long-term (current) use of medications 08/09/2017   Hyperlipidemia with target low density lipoprotein (LDL) cholesterol less than 70 mg/dL 06/22/2017   STEMI involving oth coronary artery of inferior wall ([place]) 06/20/2017   CAD S/P percutaneous coronary angioplasty 06/20/2017   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   Acute pain of right shoulder 10/07/2016   Radicular pain 08/09/2016   Seasonal allergies 07/31/2013   History of chronic sinusitis 05/11/2012   Allergy 05/11/2012   Asthma, exercise induced 10/09/2010   Chronic sinusitis 10/09/2010   Perennial allergic rhinitis with seasonal variation 10/09/2010    ONSET DATE: 09/06/22  REFERRING DIAG: I63.9 (ICD-10-CM) - CVA (cerebral vascular accident)  THERAPY DIAG:  Hemiplegia and hemiparesis following cerebral infarction affecting right non-dominant side (HCC)  Other disturbances of skin sensation  Other lack of coordination  Attention and concentration deficit  Other symptoms and signs involving cognitive functions following cerebral infarction  Rationale for Evaluation and Treatment: Rehabilitation  SUBJECTIVE:   SUBJECTIVE STATEMENT: Subjective taken from pt and spouse due to pt's expressive  aphasia.  Pt was in [place] for liposuction surgery on 09/02/22 (all under general anesthesia).  In the early hours  of 09/06/22, pt began having symptoms of stroke.  Pt went to hotel front desk to ask for assistance and collapsed in hotel lobby where they then called 911.  Pt reports having injury to R elbow and now having mild weakness and numbness along ulnar distribution, questioning fall vs stroke.  While in hospital, pt underwent  mechanical thrombectomy, resulting in movement restoration.  Pt with expressive > receptive aphasia, improved with writing. Pt accompanied by: self and significant other (spouse Cameron Hudson)  PERTINENT HISTORY: Coronary artery disease, s/p inf STEMI and DES to occluded RCA in 05/2017  PRECAUTIONS: None  WEIGHT BEARING RESTRICTIONS: No  PAIN:  Are you having pain? No  FALLS: Has patient fallen in last 6 months? Yes. Number of falls 1 - collapsed with onset of CVA  LIVING ENVIRONMENT: Lives with: lives with their family (31 and 52 yo daughters) Lives in: House/apartment Stairs: Yes: Internal: able to remain on main level will full bedroom/bathroom, does have 2nd floor - children's bedrooms are upstairs steps; on right going up and External: 4-5 steps; on right going up Has following equipment at home: None  PLOF: Independent and Independent with basic ADLs, sees pt's 2 days per week and admin 1.5 days  PATIENT GOALS: to make sure that I don't need OT  OBJECTIVE:   HAND DOMINANCE: Left  ADLs: Overall ADLs: Independent with bathing/dressing and bathroom transfers, no difficulty with clothing fasteners Transfers/ambulation related to ADLs: Independent Equipment: none  IADLs: Pt just returned home from hospital yesterday, has not yet attempted any IADLs.  Pt reports that he feels he could do them, but has not had the opportunity to attempt. Medication management: Spouse is currently assisting with dispensing medications  Financial management: Did go in to the clinic to do some payroll with supervision Handwriting: 100% legible  MOBILITY STATUS: Independent  POSTURE COMMENTS:  No Significant postural limitations  ACTIVITY TOLERANCE: Activity tolerance: WFL for tasks assessed on eval  FUNCTIONAL OUTCOME MEASURES: FOTO: 91, predicted 98 at d/c  UPPER EXTREMITY ROM:  WFL bilaterally   UPPER EXTREMITY MMT:   Grossly 5/5 bilaterally  HAND FUNCTION: Grip strength: Right: 90 lbs;  Left: 100 lbs, Lateral pinch: Right: 23 lbs, Left: 21 lbs, and 3 point pinch: Right: 20 lbs, Left: 17 lbs  COORDINATION: Finger Nose Finger test: R mild decreased coordination and speed compared to L 9 Hole Peg test: Right: 28.78 sec; Left: 20.10 sec  SENSATION: Slight decreased sensation in R hand, along ulnar distribution  COGNITION: Overall cognitive status: Within functional limits for tasks assessed and requires slowing with verbal cues to allow for increased time to process. Pt with difficulty with multi-step directions  VISION: Subjective report: no issues Baseline vision: Wears glasses for reading only  PRAXIS: Impaired: possible mild apraxia, however difficult to assess due to aphasia   TODAY'S TREATMENT:                                                              09/13/22 Engaged in education on impaired cognition and aphasia impacting dual tasking, following multi-step commands and it's impact on both work, IADLs, and leisure pursuits.  PATIENT EDUCATION: Education details: Educated on  role and purpose of OT as well as potential interventions and goals for therapy based on initial evaluation findings. Person educated: Patient and Spouse Education method: Explanation Education comprehension: verbalized understanding  HOME EXERCISE PROGRAM: TBD   GOALS: Goals reviewed with patient? Yes  SHORT TERM GOALS: Target date: 10/13/22  Pt will be Independent with coordination HEP to increase independence with ADLs/IADLs Baseline: Goal status: INITIAL  2.  Pt will complete table top scanning activity without cues, demonstrating improved sustained attention to task. Baseline:  Goal status: INITIAL  3.  Pt will complete dual task activity in mild distracting environment with 90% accuracy. Baseline:  Goal status: INITIAL   LONG TERM GOALS: Target date: 11/10/22  Pt will demonstrate improved fine motor coordination for ADLs as evidenced by decreasing 9 hole peg test  score for RUE by 5 secs Baseline: Right: 28.78 sec; Left: 20.10 sec Goal status: INITIAL  2.  Pt will be able to engage in dual task activity with 100% accuracy to demonstrate increase awareness, organization, and sequencing. Baseline:  Goal status: INITIAL  3.  Pt will complete a moderately challenging bill paying/computer task <2 errors due to aphasia. Baseline:  Goal status: INITIAL  4.  Pt will complete a 3 step task to carryover to IADL/work tasks with <1 cue for recall/sequencing. Baseline:  Goal status: INITIAL   ASSESSMENT:  CLINICAL IMPRESSION: Patient is a 57 y.o. male who was seen today for occupational therapy evaluation for impairments s/p CVA. Pt presents with expressive > receptive aphasia, difficulty with multi-tasking, following multi-step commands, typing on phone/computer, impaired coordination and sensation, requires increased time for processing, and reports decreased sustained attention for greater periods of time.  Pt currently lives with spouse and teenage children in a 2 story home with main bedroom and bathroom on first floor and works as an Clinical biochemist prior to onset. Pt will benefit from skilled occupational therapy services to address strength and coordination, altered sensation, GM/FM control, cognition, safety awareness, introduction of compensatory strategies/AE prn, and implementation of an HEP to improve participation and safety during IADLs and safe return to work.   PERFORMANCE DEFICITS: in functional skills including IADLs, coordination, sensation, Fine motor control, Gross motor control, endurance, and UE functional use, cognitive skills including attention, memory, orientation, problem solving, safety awareness, sequencing, thought, and understand, and psychosocial skills including environmental adaptation, habits, and routines and behaviors.   IMPAIRMENTS: are limiting patient from IADLs, work, leisure, and social participation.   CO-MORBIDITIES: may  have co-morbidities  that affects occupational performance. Patient will benefit from skilled OT to address above impairments and improve overall function.  MODIFICATION OR ASSISTANCE TO COMPLETE EVALUATION: No modification of tasks or assist necessary to complete an evaluation.  OT OCCUPATIONAL PROFILE AND HISTORY: Problem focused assessment: Including review of records relating to presenting problem.  CLINICAL DECISION MAKING: LOW - limited treatment options, no task modification necessary  REHAB POTENTIAL: Good  EVALUATION COMPLEXITY: Low    PLAN:  OT FREQUENCY: 1-2x/week  OT DURATION: 8 weeks  PLANNED INTERVENTIONS: self care/ADL training, therapeutic exercise, therapeutic activity, neuromuscular re-education, functional mobility training, electrical stimulation, ultrasound, compression bandaging, moist heat, cryotherapy, patient/family education, cognitive remediation/compensation, psychosocial skills training, energy conservation, coping strategies training, and DME and/or AE instructions  RECOMMENDED OTHER SERVICES: NA  CONSULTED AND AGREED WITH PLAN OF CARE: Patient and family member/caregiver  PLAN FOR NEXT SESSION: Initiate [facility] tasks, removing beads from putty (with tweezers?), dual tasking and multi-step commands for sequencing   Tametha Banning, OTR/L 09/13/2022,  3:51 PM

## 2022-09-13 NOTE — Therapy (Unsigned)
OUTPATIENT SPEECH LANGUAGE PATHOLOGY APHASIA EVALUATION   Patient Name: Cameron Hudson MRN: [id number] DOB:10/14/1965, 57 y.o., male Today's Date: 09/14/2022  PCP: Shon Baton, MD REFERRING PROVIDER: [facility] MD Virgina Jock- Doc)  END OF SESSION:  End of Session - 09/13/22 2359     Visit Number 1    Number of Visits 17    Date for SLP Re-Evaluation 11/22/22    Authorization - Number of Visits 10    SLP Start Time [id number]    SLP Stop Time  1316    SLP Time Calculation (min) 41 min    Activity Tolerance Patient tolerated treatment well             Past Medical History:  Diagnosis Date   Allergy    Asthma    CAD S/P percutaneous coronary angioplasty 06/20/2017   Inferior STEMI - 100% dRCA --> DES PCI with Synergy 3.0 x 24 (3.3 mm). Mild 10-30% disease in p-mRCA, Cx & LAD.  EF Normal.    Hemorrhoids    Hx of adenomatous polyp of colon 08/01/2019   Hyperlipidemia    IBS (irritable bowel syndrome)    Personal history of colonic polyps 2004   12 mm adenoma   Seasonal allergies    STEMI involving oth coronary artery of inferior wall ([place]) 06/20/2017   12/18 PCI/DESx1 to dRCA, normal EF   Past Surgical History:  Procedure Laterality Date   CHOLECYSTECTOMY N/A 11/28/2019   Procedure: LAPAROSCOPIC CHOLECYSTECTOMY;  Surgeon: Coralie Keens, MD;  Location: [facility];  Service: General;  Laterality: N/A;   COLONOSCOPY     COLONOSCOPY W/ POLYPECTOMY  08/27/2002; 08/30/2005; 01/06/2011   51mm adenoma, diverticulosis, hemorrhoids; normal;  diverticulosis and small hemorrhoids   CORONARY STENT INTERVENTION N/A 06/20/2017   Procedure: CORONARY STENT INTERVENTION;  Surgeon: Troy Sine, MD;  Location: [facility];  Service: Cardiovascular;  Laterality: N/A;   LEFT HEART CATH AND CORONARY ANGIOGRAPHY N/A 06/20/2017   Procedure: LEFT HEART CATH AND CORONARY ANGIOGRAPHY;  Surgeon: Troy Sine, MD;  Location: [facility];  Service: Cardiovascular;   Laterality: N/A;   NASAL SINUS SURGERY  Dec 2011   POLYPECTOMY     Patient Active Problem List   Diagnosis Date Noted   Hx of adenomatous polyp of colon 08/01/2019   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   IFG (impaired fasting glucose) 08/11/2017   Hypogonadism, male 08/11/2017   Statin intolerance 08/09/2017   Encounter for long-term (current) use of medications 08/09/2017   Hyperlipidemia with target low density lipoprotein (LDL) cholesterol less than 70 mg/dL 06/22/2017   STEMI involving oth coronary artery of inferior wall ([place]) 06/20/2017   CAD S/P percutaneous coronary angioplasty 06/20/2017   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 Hello. Thank you." Pt accompanied by: significant other wife-Jill  PERTINENT HISTORY: HLD, CAD,S/P one stent  PAIN:  Are you having pain? No  FALLS: Has patient fallen in last 6 months?  No  LIVING ENVIRONMENT: Lives with: lives with their family Lives in: House/apartment  PLOF:  Level of assistance: Independent with ADLs, Independent with IADLs Employment: Full-time employment  PATIENT GOALS: Improve language function  OBJECTIVE:   DIAGNOSTIC FINDINGS: CT Head 09/06/22 Id'd Left MCA occlusion, (S/P thrombectomy performed).  COGNITION: Overall cognitive status: Difficulty to assess due to: Communication impairment Areas of impairment:  Behavior: Within functional limits  AUDITORY COMPREHENSION: Overall auditory comprehension: Impaired: simple, moderately  complex, and complex YES/NO questions: Impaired: simple and moderately complex; complex not tested Following directions: Impaired: moderately complex and complex Conversation: Simple, Complex, and Moderately Complex (simple conversation intermittently understood and better with effective techniques below) Interfering components:  decr'd error awareness, linguistically Effective technique: extra processing time, pausing, repetition/stressing words, slowed speech, written cues, and visual/gestural cues  READING COMPREHENSION: Intact for word level   EXPRESSION: verbal and nonverbal - written  VERBAL EXPRESSION: Level of generative/spontaneous verbalization: word, phrase, and sentence Automatic speech: day of week: impaired and month of year: impaired  Repetition: Impaired: Word, phrase, and sentence (words >2-3 syllables) Naming: Responsive: 26-50% and Confrontation: 0-25% Pragmatics: Appears intact Effective technique: open ended questions, phonemic cues, and written cues Non-verbal means of communication: writing; SLP encouraged pt to use gestures. Word level writing is mostly successful as a compensation at this time.  WRITTEN EXPRESSION: Dominant hand: left Written expression: Impaired: phrase and sentence  MOTOR SPEECH: Overall motor speech: Appears intact Respiration: thoracic breathing and diaphragmatic/abdominal breathing Phonation: normal Resonance: WFL Articulation: Appears intact; dysarthria was not appreciated today Intelligibility: Intelligibility reduced due to neologism and phonemic errors Motor planning: Appears intact  ORAL MOTOR EXAMINATION: Overall status: WFL Comments: Pt with slight rt facial droop at time of CVA  STANDARDIZED ASSESSMENTS: QAB: to be completed next 1-2 sessions - scores reported at that time  PATIENT REPORTED OUTCOME MEASURES (PROM): Communication Effectiveness Survey: provided in first 2 sessions   TODAY'S TREATMENT:                                                                                                                                          DATE:   N/A (10 ST visits on pt's health plan)   PATIENT EDUCATION: Education details: results of evaluation, therapy course suggested Person educated: Patient and Spouse Education method: Explanation and Demonstration Education comprehension: verbalized understanding, verbal cues required, and needs further education   GOALS: Goals reviewed with patient? Yes  SHORT TERM GOALS: Target date: 10/15/22  Pt will name average 8 items in salient, practical categories in 2 sessions Baseline: Goal status: INITIAL  2.  Pt will communicate basic needs/wants with use of compensatory strategies in 3 sessions Baseline:  Goal status: INITIAL  3.  Pt will demo understanding of sentence stimuli in structured tasks in 3 sessions Baseline:  Goal status: INITIAL  4.  With extra time, pt will demo understanding of 2-spoken sentence stimuli in 3 sessions Baseline:  Goal status: INITIAL   LONG TERM GOALS: Target date: 11/22/22  Pt will name average 8 items in simple categories (non-personal for pt) in 3 sessions Baseline:  Goal status: INITIAL  2.  Pt will produce MLU of >6.5 average in 3-minute speech sample  Baseline:  Goal status: INITIAL  3.  Pt family will demo appropriate cueing for pt's receptive and expressive language in 5 sessions Baseline:  Goal status: INITIAL  4.  Pt will score higher on PROM than on initial reading in first 2 sessions Baseline:  Goal status: INITIAL   ASSESSMENT:  CLINICAL IMPRESSION: Patient is a 57 y.o. male who was seen today for assessment of expressive and receptive language. Anecdotally, pt with mod-severe receptive and expressive aphasia. Once QAB is completed SLP will have severity level from a standardized measure. Functionally, pt cannot communicate with family unless with MLU average 4.2 so verbal expression is limited and  contains primarily neologism and phonemic paraphasias in his error patterns. Pt needs speech and language skills for his occuaption.   OBJECTIVE IMPAIRMENTS: include expressive language, receptive language, aphasia, and dysphagia. These impairments are limiting patient from return to work, household responsibilities, ADLs/IADLs, and effectively communicating at home and in community. Factors affecting potential to achieve goals and functional outcome are severity of impairments and limited insurance coverage for ST . Patient will benefit from skilled SLP services to address above impairments and improve overall function.  REHAB POTENTIAL: Good  PLAN:  SLP FREQUENCY: 2x/week  SLP DURATION:  10 sessions  PLANNED INTERVENTIONS: Language facilitation, Environmental controls, Trials of upgraded texture/liquids, Cueing hierachy, Internal/external aids, Functional tasks, Multimodal communication approach, SLP instruction and feedback, Compensatory strategies, and Patient/family education    [facility], [place] 09/14/2022, 12:00 AM

## 2022-09-14 ENCOUNTER — Ambulatory Visit: Payer: PRIVATE HEALTH INSURANCE

## 2022-09-14 DIAGNOSIS — I69353 Hemiplegia and hemiparesis following cerebral infarction affecting right non-dominant side: Secondary | ICD-10-CM | POA: Diagnosis not present

## 2022-09-14 DIAGNOSIS — R4701 Aphasia: Secondary | ICD-10-CM

## 2022-09-14 NOTE — Patient Instructions (Addendum)
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

## 2022-09-14 NOTE — Therapy (Signed)
OUTPATIENT SPEECH LANGUAGE PATHOLOGY APHASIA TREATMENT   Patient Name: Cameron Hudson MRN: [id number] DOB:[birth year], 57 y.o., male Today's Date: 09/14/2022  PCP: Shon Baton, MD REFERRING PROVIDER: [facility] MD Virgina Jock- Doc)  END OF SESSION:  End of Session - 09/14/22 0834     Visit Number 2    Number of Visits 17    Date for SLP Re-Evaluation 11/22/22    Authorization Time Period ST= 10 visits    Authorization - Number of Visits 31    SLP Start Time 0845    SLP Stop Time  0915   requested early departure   SLP Time Calculation (min) 30 min    Activity Tolerance Patient tolerated treatment well             Past Medical History:  Diagnosis Date   Allergy    Asthma    CAD S/P percutaneous coronary angioplasty 06/20/2017   Inferior STEMI - 100% dRCA --> DES PCI with Synergy 3.0 x 24 (3.3 mm). Mild 10-30% disease in p-mRCA, Cx & LAD.  EF Normal.    Hemorrhoids    Hx of adenomatous polyp of colon 08/01/2019   Hyperlipidemia    IBS (irritable bowel syndrome)    Personal history of colonic polyps 2004   12 mm adenoma   Seasonal allergies    STEMI involving oth coronary artery of inferior wall ([place]) 06/20/2017   12/18 PCI/DESx1 to dRCA, normal EF   Past Surgical History:  Procedure Laterality Date   CHOLECYSTECTOMY N/A 11/28/2019   Procedure: LAPAROSCOPIC CHOLECYSTECTOMY;  Surgeon: Coralie Keens, MD;  Location: [facility];  Service: General;  Laterality: N/A;   COLONOSCOPY     COLONOSCOPY W/ POLYPECTOMY  08/27/2002; 08/30/2005; 01/06/2011   40mm adenoma, diverticulosis, hemorrhoids; normal;  diverticulosis and small hemorrhoids   CORONARY STENT INTERVENTION N/A 06/20/2017   Procedure: CORONARY STENT INTERVENTION;  Surgeon: Troy Sine, MD;  Location: [facility];  Service: Cardiovascular;  Laterality: N/A;   LEFT HEART CATH AND CORONARY ANGIOGRAPHY N/A 06/20/2017   Procedure: LEFT HEART CATH AND CORONARY ANGIOGRAPHY;  Surgeon: Troy Sine, MD;  Location: [facility];  Service: Cardiovascular;  Laterality: N/A;   NASAL SINUS SURGERY  Dec 2011   POLYPECTOMY     Patient Active Problem List   Diagnosis Date Noted   Hx of adenomatous polyp of colon 08/01/2019   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   IFG (impaired fasting glucose) 08/11/2017   Hypogonadism, male 08/11/2017   Statin intolerance 08/09/2017   Encounter for long-term (current) use of medications 08/09/2017   Hyperlipidemia with target low density lipoprotein (LDL) cholesterol less than 70 mg/dL 06/22/2017   STEMI involving oth coronary artery of inferior wall ([place]) 06/20/2017   CAD S/P percutaneous coronary angioplasty 06/20/2017   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   Acute pain of right shoulder 10/07/2016   Radicular pain 08/09/2016   Seasonal allergies 07/31/2013   History of chronic sinusitis 05/11/2012   Allergy 05/11/2012   Asthma, exercise induced 10/09/2010   Chronic sinusitis 10/09/2010   Perennial allergic rhinitis with seasonal variation 10/09/2010    ONSET DATE: 09/06/22   REFERRING DIAG: Aphasia  THERAPY DIAG: Aphasia  Rationale for Evaluation and Treatment: Rehabilitation  SUBJECTIVE:   SUBJECTIVE STATEMENT: "It's...ugh (pointed to hwk)" Pt accompanied by: significant other wife-Jill  PERTINENT HISTORY: HLD, CAD,S/P one stent  PAIN: Are you having pain? No  FALLS: Has patient fallen in last 6 months?  No  PATIENT GOALS: Improve  language function  OBJECTIVE:   DIAGNOSTIC FINDINGS: CT Head 09/06/22 Id'd Left MCA occlusion, (S/P thrombectomy performed).  COGNITION: Overall cognitive status: Difficulty to assess due to: Communication impairment Areas of impairment:  Behavior: Within functional limits  AUDITORY COMPREHENSION: Overall auditory comprehension: Impaired: simple, moderately complex, and complex YES/NO questions: Impaired: simple and moderately complex; complex not tested Following directions: Impaired:  moderately complex and complex Conversation: Simple, Complex, and Moderately Complex (simple conversation intermittently understood and better with effective techniques below) Interfering components:  decr'd error awareness, linguistically Effective technique: extra processing time, pausing, repetition/stressing words, slowed speech, written cues, and visual/gestural cues  READING COMPREHENSION: Intact for word level   EXPRESSION: verbal and nonverbal - written  VERBAL EXPRESSION: Level of generative/spontaneous verbalization: word, phrase, and sentence Automatic speech: day of week: impaired and month of year: impaired  Repetition: Impaired: Word, phrase, and sentence (words >2-3 syllables) Naming: Responsive: 26-50% and Confrontation: 0-25% Pragmatics: Appears intact Effective technique: open ended questions, phonemic cues, and written cues Non-verbal means of communication: writing; SLP encouraged pt to use gestures. Word level writing is mostly successful as a compensation at this time.  WRITTEN EXPRESSION: Dominant hand: left Written expression: Impaired: phrase and sentence  MOTOR SPEECH: Overall motor speech: Appears intact Respiration: thoracic breathing and diaphragmatic/abdominal breathing Phonation: normal Resonance: WFL Articulation: Appears intact; dysarthria was not appreciated today Intelligibility: Intelligibility reduced due to neologism and phonemic errors Motor planning: Appears intact  ORAL MOTOR EXAMINATION: Overall status: WFL Comments: Pt with slight rt facial droop at time of CVA  STANDARDIZED ASSESSMENTS: QAB: to be completed next 1-2 sessions - scores reported at that time  PATIENT REPORTED OUTCOME MEASURES (PROM): Communication Effectiveness Survey: provided in first 2 sessions   TODAY'S TREATMENT:                                                                                                                                          09/17/22:  Expressed frustration re: hwk for sentence completions. Demo'd good comprehension of task, correct completion of sentences, and accurate writing; however, usual verbal reading errors noted, particularly content words. Reduced awareness of errors demonstrated initially, which improved throughout session. SLP educated rationale of task, including task hierarchy (starting with easier, more automatic tasks and then advancing) and targeted deficits (comprehension and expression). Writing noted as relative strength with pt often writing key words to optimize listener comprehension given usual neologisms and paraphasias. SLP targeted verbal expression of personally relevant words from his personal notebook. SLP demonstrated recommended cueing hierarchy to maximize patient performance. Pt benefited from usual verbal/visual modeling fading to visual modeling (mouthing sounds) to saying word in response to a question to saying word independently with intermittent choral practice needed. See patient instructions for additional education and HEP. Briefly discussed auditory comprehension strategies (slower rate, repetition, rephrasing, confirmation); additional education warranted.   PATIENT EDUCATION: Education details: see above Person educated: Patient  and Spouse Education method: Explanation, Demonstration, Verbal cues, and Handouts Education comprehension: verbalized understanding, verbal cues required, and needs further education   GOALS: Goals reviewed with patient? Yes  SHORT TERM GOALS: Target date: 10/15/22  Pt will name average 8 items in salient, practical categories in 2 sessions Baseline: Goal status: IN PROGRESS  2.  Pt will communicate basic needs/wants with use of compensatory strategies in 3 sessions Baseline:  Goal status: IN PROGRESS  3.  Pt will demo understanding of sentence stimuli in structured tasks in 3 sessions Baseline:  Goal status: IN PROGRESS  4.  With extra time, pt will  demo understanding of 2-spoken sentence stimuli in 3 sessions Baseline:  Goal status: IN PROGRESS   LONG TERM GOALS: Target date: 11/22/22  Pt will name average 8 items in simple categories (non-personal for pt) in 3 sessions Baseline:  Goal status: IN PROGRESS  2.  Pt will produce MLU of >6.5 average in 3-minute speech sample  Baseline:  Goal status: IN PROGRESS  3.  Pt family will demo appropriate cueing for pt's receptive and expressive language in 5 sessions Baseline:  Goal status: IN PROGRESS  4.  Pt will score higher on PROM than on initial reading in first 2 sessions Baseline:  Goal status: IN PROGRESS   ASSESSMENT:  CLINICAL IMPRESSION: Patient is a 57 y.o. male who was seen today for assessment of expressive and receptive language. Anecdotally, pt with mod-severe receptive and expressive aphasia. Once QAB is completed SLP will have severity level from a standardized measure. Functionally, pt cannot communicate with family unless with MLU average 4.2 so verbal expression is limited and contains primarily neologism and phonemic paraphasias in his error patterns. Initiated education and instruction of strategies and tasks to aid verbal expression and auditory comprehension at home. Pt needs speech and language skills for his occuaption.   OBJECTIVE IMPAIRMENTS: include expressive language, receptive language, aphasia, and dysphagia. These impairments are limiting patient from return to work, household responsibilities, ADLs/IADLs, and effectively communicating at home and in community. Factors affecting potential to achieve goals and functional outcome are severity of impairments and limited insurance coverage for ST . Patient will benefit from skilled SLP services to address above impairments and improve overall function.  REHAB POTENTIAL: Good  PLAN:  SLP FREQUENCY: 2x/week  SLP DURATION:  10 sessions  PLANNED INTERVENTIONS: Language facilitation, Environmental  controls, Trials of upgraded texture/liquids, Cueing hierachy, Internal/external aids, Functional tasks, Multimodal communication approach, SLP instruction and feedback, Compensatory strategies, and Patient/family education    Marzetta Board, CCC-SLP 09/14/2022, 9:20 AM

## 2022-09-18 ENCOUNTER — Ambulatory Visit: Payer: PRIVATE HEALTH INSURANCE

## 2022-09-18 DIAGNOSIS — I69353 Hemiplegia and hemiparesis following cerebral infarction affecting right non-dominant side: Secondary | ICD-10-CM | POA: Diagnosis not present

## 2022-09-18 DIAGNOSIS — R4701 Aphasia: Secondary | ICD-10-CM

## 2022-09-18 NOTE — Therapy (Signed)
OUTPATIENT SPEECH LANGUAGE PATHOLOGY APHASIA TREATMENT   Patient Name: Cameron Hudson MRN: [id number] DOB:05-Nov-1965, 57 y.o., male Today's Date: 09/19/2022  PCP: Shon Baton, MD REFERRING PROVIDER: [facility] MD Virgina Jock- Doc)  END OF SESSION:  End of Session - 09/19/22 0835     Visit Number 3    Number of Visits 17    Date for SLP Re-Evaluation 11/22/22    Authorization Time Period ST= 10 visits    Authorization - Visit Number 2    Authorization - Number of Visits 10    SLP Start Time 1610    SLP Stop Time  1703    SLP Time Calculation (min) 53 min    Activity Tolerance Patient tolerated treatment well              Past Medical History:  Diagnosis Date   Allergy    Asthma    CAD S/P percutaneous coronary angioplasty 06/20/2017   Inferior STEMI - 100% dRCA --> DES PCI with Synergy 3.0 x 24 (3.3 mm). Mild 10-30% disease in p-mRCA, Cx & LAD.  EF Normal.    Hemorrhoids    Hx of adenomatous polyp of colon 08/01/2019   Hyperlipidemia    IBS (irritable bowel syndrome)    Personal history of colonic polyps 2004   12 mm adenoma   Seasonal allergies    STEMI involving oth coronary artery of inferior wall ([place]) 06/20/2017   12/18 PCI/DESx1 to dRCA, normal EF   Past Surgical History:  Procedure Laterality Date   CHOLECYSTECTOMY N/A 11/28/2019   Procedure: LAPAROSCOPIC CHOLECYSTECTOMY;  Surgeon: Coralie Keens, MD;  Location: [facility];  Service: General;  Laterality: N/A;   COLONOSCOPY     COLONOSCOPY W/ POLYPECTOMY  08/27/2002; 08/30/2005; 01/06/2011   59mm adenoma, diverticulosis, hemorrhoids; normal;  diverticulosis and small hemorrhoids   CORONARY STENT INTERVENTION N/A 06/20/2017   Procedure: CORONARY STENT INTERVENTION;  Surgeon: Troy Sine, MD;  Location: [facility];  Service: Cardiovascular;  Laterality: N/A;   LEFT HEART CATH AND CORONARY ANGIOGRAPHY N/A 06/20/2017   Procedure: LEFT HEART CATH AND CORONARY ANGIOGRAPHY;  Surgeon:  Troy Sine, MD;  Location: [facility];  Service: Cardiovascular;  Laterality: N/A;   NASAL SINUS SURGERY  Dec 2011   POLYPECTOMY     Patient Active Problem List   Diagnosis Date Noted   Hx of adenomatous polyp of colon 08/01/2019   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   IFG (impaired fasting glucose) 08/11/2017   Hypogonadism, male 08/11/2017   Statin intolerance 08/09/2017   Encounter for long-term (current) use of medications 08/09/2017   Hyperlipidemia with target low density lipoprotein (LDL) cholesterol less than 70 mg/dL 06/22/2017   STEMI involving oth coronary artery of inferior wall ([place]) 06/20/2017   CAD S/P percutaneous coronary angioplasty 06/20/2017   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   Acute pain of right shoulder 10/07/2016   Radicular pain 08/09/2016   Seasonal allergies 07/31/2013   History of chronic sinusitis 05/11/2012   Allergy 05/11/2012   Asthma, exercise induced 10/09/2010   Chronic sinusitis 10/09/2010   Perennial allergic rhinitis with seasonal variation 10/09/2010    ONSET DATE: 09/06/22   REFERRING DIAG: Aphasia  THERAPY DIAG: Aphasia  Rationale for Evaluation and Treatment: Rehabilitation  SUBJECTIVE:   SUBJECTIVE STATEMENT: "Oh, thank you." (SLP explained SLP got pt early) Pt accompanied by: significant other wife-Jill  PERTINENT HISTORY: HLD, CAD,S/P one stent  PAIN: Are you having pain? No  FALLS: Has patient fallen  in last 6 months?  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  READING COMPREHENSION: Intact for word level, and for phrase/short sentence level. Pt language hindering correct reading of the sentences.  STANDARDIZED ASSESSMENTS: QAB: completed today: Scores below: Summary      Word comprehension 6.67  Sentence comprehension 2.92  Word finding 1.42  Grammatical construction 3.00  Speech motor  programming 7.50  Repetition 4.58  Reading 0.00  QAB overall 3.73 (severe)    PATIENT REPORTED OUTCOME MEASURES (PROM): Communication Effectiveness Survey: provided next session   TODAY'S TREATMENT:                                                                                                                                          09/18/22:QAB completed today. Score above. Pt with less frustration today - self-calming behavior, "It's ok it's ok" x4. SLP provided education on Constant Therapy telling pt/wife that pt could work on it if frustration level does not inhibit ability to complete. Homework to say family names, other automatic speech, and cloze phrases/sentences.  SLP provided clinic number for [facility] and suggested wife call to obtain information about possible summer sessions.  09/14/22: Expressed frustration re: hwk for sentence completions. Demo'd good comprehension of task, correct completion of sentences, and accurate writing; however, usual verbal reading errors noted, particularly content words. Reduced awareness of errors demonstrated initially, which improved throughout session. SLP educated rationale of task, including task hierarchy (starting with easier, more automatic tasks and then advancing) and targeted deficits (comprehension and expression). Writing noted as relative strength with pt often writing key words to optimize listener comprehension given usual neologisms and paraphasias. SLP targeted verbal expression of personally relevant words from his personal notebook. SLP demonstrated recommended cueing hierarchy to maximize patient performance. Pt benefited from usual verbal/visual modeling fading to visual modeling (mouthing sounds) to saying word in response to a question to saying word independently with intermittent choral practice needed. See patient instructions for additional education and HEP. Briefly discussed auditory comprehension strategies (slower rate,  repetition, rephrasing, confirmation); additional education warranted.   PATIENT EDUCATION: Education details: see above Person educated: Patient and Spouse Education method: Explanation, Demonstration, Verbal cues, and Handouts Education comprehension: verbalized understanding, verbal cues required, and needs further education   GOALS: Goals reviewed with patient? Yes  SHORT TERM GOALS: Target date: 10/15/22  Pt will name average 8 items in salient, practical categories in 2 sessions Baseline: Goal status: IN PROGRESS  2.  Pt will communicate basic needs/wants with use of compensatory strategies in 3 sessions Baseline:  Goal status: IN PROGRESS  3.  Pt will demo understanding of sentence stimuli in structured tasks in 3 sessions Baseline:  Goal status: IN PROGRESS  4.  With extra time, pt will demo understanding of 2-spoken sentence stimuli in 3  sessions Baseline:  Goal status: IN PROGRESS   LONG TERM GOALS: Target date: 11/22/22  Pt will name average 8 items in simple categories (non-personal for pt) in 3 sessions Baseline:  Goal status: IN PROGRESS  2.  Pt will produce MLU of >6.5 average in 3-minute speech sample  Baseline:  Goal status: IN PROGRESS  3.  Pt family will demo appropriate cueing for pt's receptive and expressive language in 5 sessions Baseline:  Goal status: IN PROGRESS  4.  Pt will score higher on PROM than on initial reading in first 2 sessions Baseline:  Goal status: IN PROGRESS   ASSESSMENT:  CLINICAL IMPRESSION: Patient is a 57 y.o. male who was seen today for treatment of expressive and receptive language. QAB was completed today and severity of aphasia is scored as "severe". Verbal expression continues as limited and contains primarily neologism and phonemic paraphasias in his error patterns. Pt needs speech and language skills for his occupation.   OBJECTIVE IMPAIRMENTS: include expressive language, receptive language, aphasia, and  dysphagia. These impairments are limiting patient from return to work, household responsibilities, ADLs/IADLs, and effectively communicating at home and in community. Factors affecting potential to achieve goals and functional outcome are severity of impairments and limited insurance coverage for ST . Patient will benefit from skilled SLP services to address above impairments and improve overall function.  REHAB POTENTIAL: Good  PLAN:  SLP FREQUENCY: 2x/week  SLP DURATION:  10 sessions  PLANNED INTERVENTIONS: Language facilitation, Environmental controls, Trials of upgraded texture/liquids, Cueing hierachy, Internal/external aids, Functional tasks, Multimodal communication approach, SLP instruction and feedback, Compensatory strategies, and Patient/family education    [facility], [place] 09/19/2022, 8:35 AM

## 2022-09-20 ENCOUNTER — Ambulatory Visit: Payer: PRIVATE HEALTH INSURANCE

## 2022-09-20 DIAGNOSIS — R4701 Aphasia: Secondary | ICD-10-CM

## 2022-09-20 DIAGNOSIS — I69353 Hemiplegia and hemiparesis following cerebral infarction affecting right non-dominant side: Secondary | ICD-10-CM | POA: Diagnosis not present

## 2022-09-20 NOTE — Therapy (Signed)
OUTPATIENT SPEECH LANGUAGE PATHOLOGY APHASIA TREATMENT   Patient Name: Cameron Hudson MRN: [id number] DOB:23-Dec-1965, 57 y.o., male Today's Date: 09/20/2022  PCP: Shon Baton, MD REFERRING PROVIDER: [facility] MD Virgina Jock- Doc)  END OF SESSION:  End of Session - 09/20/22 1622     Visit Number 4    Number of Visits 17    Date for SLP Re-Evaluation 11/22/22    Authorization Time Period ST= 20 visits (per case mgr)    Authorization - Visit Number 3    Authorization - Number of Visits 20    SLP Start Time 1620    SLP Stop Time  1700    SLP Time Calculation (min) 40 min    Activity Tolerance Patient tolerated treatment well              Past Medical History:  Diagnosis Date   Allergy    Asthma    CAD S/P percutaneous coronary angioplasty 06/20/2017   Inferior STEMI - 100% dRCA --> DES PCI with Synergy 3.0 x 24 (3.3 mm). Mild 10-30% disease in p-mRCA, Cx & LAD.  EF Normal.    Hemorrhoids    Hx of adenomatous polyp of colon 08/01/2019   Hyperlipidemia    IBS (irritable bowel syndrome)    Personal history of colonic polyps 2004   12 mm adenoma   Seasonal allergies    STEMI involving oth coronary artery of inferior wall ([place]) 06/20/2017   12/18 PCI/DESx1 to dRCA, normal EF   Past Surgical History:  Procedure Laterality Date   CHOLECYSTECTOMY N/A 11/28/2019   Procedure: LAPAROSCOPIC CHOLECYSTECTOMY;  Surgeon: Coralie Keens, MD;  Location: [place];  Service: General;  Laterality: N/A;   COLONOSCOPY     COLONOSCOPY W/ POLYPECTOMY  08/27/2002; 08/30/2005; 01/06/2011   31mm adenoma, diverticulosis, hemorrhoids; normal;  diverticulosis and small hemorrhoids   CORONARY STENT INTERVENTION N/A 06/20/2017   Procedure: CORONARY STENT INTERVENTION;  Surgeon: Troy Sine, MD;  Location: [place] [facility];  Service: Cardiovascular;  Laterality: N/A;   LEFT HEART CATH AND CORONARY ANGIOGRAPHY N/A 06/20/2017   Procedure: LEFT HEART CATH AND CORONARY ANGIOGRAPHY;   Surgeon: Troy Sine, MD;  Location: [place];  Service: Cardiovascular;  Laterality: N/A;   NASAL SINUS SURGERY  Dec 2011   POLYPECTOMY     Patient Active Problem List   Diagnosis Date Noted   Hx of adenomatous polyp of colon 08/01/2019   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   IFG (impaired fasting glucose) 08/11/2017   Hypogonadism, male 08/11/2017   Statin intolerance 08/09/2017   Encounter for long-term (current) use of medications 08/09/2017   Hyperlipidemia with target low density lipoprotein (LDL) cholesterol less than 70 mg/dL 06/22/2017   STEMI involving oth coronary artery of inferior wall ([place]) 06/20/2017   CAD S/P percutaneous coronary angioplasty 06/20/2017   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   Acute pain of right shoulder 10/07/2016   Radicular pain 08/09/2016   Seasonal allergies 07/31/2013   History of chronic sinusitis 05/11/2012   Allergy 05/11/2012   Asthma, exercise induced 10/09/2010   Chronic sinusitis 10/09/2010   Perennial allergic rhinitis with seasonal variation 10/09/2010    ONSET DATE: 09/06/22   REFERRING DIAG: Aphasia  THERAPY DIAG: Aphasia  Rationale for Evaluation and Treatment: Rehabilitation  SUBJECTIVE:   SUBJECTIVE STATEMENT: "Greeting." (After SLP "Greetings!") Pt accompanied by: significant other wife-Cameron Hudson  PERTINENT HISTORY: HLD, CAD,S/P one stent  PAIN: Are you having pain? No  FALLS: Has patient fallen in last  6 months?  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  READING COMPREHENSION: Intact for word level, and for phrase/short sentence level. Pt language hindering correct reading of the sentences.  STANDARDIZED ASSESSMENTS: QAB: completed today: Scores below: Summary      Word comprehension 6.67  Sentence comprehension 2.92  Word finding 1.42  Grammatical construction 3.00  Speech motor  programming 7.50  Repetition 4.58  Reading 0.00  QAB overall 3.73 (severe)    PATIENT REPORTED OUTCOME MEASURES (PROM): Communication Effectiveness Survey: provided next session   TODAY'S TREATMENT:                                                                                                                                          09/20/22: SLP targeted expressive and receptive language. SLP modeled practice for home tasks and provided wife with suggested cues for target word. Pt benefited today from semantic cues. Pt filled in 2 letters for body parts with 4/6 success and incr'd to 6/6 with min A. Written cues from pt did not appear to assist verbal expression. Neologism and semantic errors prevalent, with incr'd awareness than Monday.  09/18/22:QAB completed today. Score above. Pt with less frustration today - self-calming behavior, "It's ok it's ok" x4. SLP provided education on Constant Therapy telling pt/wife that pt could work on it if frustration level does not inhibit ability to complete. Homework to say family names, other automatic speech, and cloze phrases/sentences.  SLP provided clinic number for [facility] and suggested wife call to obtain information about possible summer sessions.  09/14/22: Expressed frustration re: hwk for sentence completions. Demo'd good comprehension of task, correct completion of sentences, and accurate writing; however, usual verbal reading errors noted, particularly content words. Reduced awareness of errors demonstrated initially, which improved throughout session. SLP educated rationale of task, including task hierarchy (starting with easier, more automatic tasks and then advancing) and targeted deficits (comprehension and expression). Writing noted as relative strength with pt often writing key words to optimize listener comprehension given usual neologisms and paraphasias. SLP targeted verbal expression of personally relevant words from his personal notebook.  SLP demonstrated recommended cueing hierarchy to maximize patient performance. Pt benefited from usual verbal/visual modeling fading to visual modeling (mouthing sounds) to saying word in response to a question to saying word independently with intermittent choral practice needed. See patient instructions for additional education and HEP. Briefly discussed auditory comprehension strategies (slower rate, repetition, rephrasing, confirmation); additional education warranted.   PATIENT EDUCATION: Education details: see above Person educated: Patient and Spouse Education method: Explanation, Demonstration, Verbal cues, and Handouts Education comprehension: verbalized understanding, verbal cues required, and needs further education   GOALS: Goals reviewed with patient? Yes  SHORT TERM GOALS: Target date: 10/15/22  Pt will name average 8 items in salient, practical categories in 2  sessions Baseline: Goal status: IN PROGRESS  2.  Pt will communicate basic needs/wants with use of compensatory strategies in 3 sessions Baseline:  Goal status: IN PROGRESS  3.  Pt will demo understanding of sentence stimuli in structured tasks in 3 sessions Baseline:  Goal status: IN PROGRESS  4.  With extra time, pt will demo understanding of 2-spoken sentence stimuli in 3 sessions Baseline:  Goal status: IN PROGRESS   LONG TERM GOALS: Target date: 11/22/22  Pt will name average 8 items in simple categories (non-personal for pt) in 3 sessions Baseline:  Goal status: IN PROGRESS  2.  Pt will produce MLU of >6.5 average in 3-minute speech sample  Baseline:  Goal status: IN PROGRESS  3.  Pt family will demo appropriate cueing for pt's receptive and expressive language in 5 sessions Baseline:  Goal status: IN PROGRESS  4.  Pt will score higher on PROM than on initial reading in first 2 sessions Baseline:  Goal status: IN PROGRESS   ASSESSMENT:  CLINICAL IMPRESSION: Patient is a 57 y.o. male who  was seen today for treatment of expressive and receptive language. Wife contacted insurance and case manager offered 10 more [place] visits, "but we will have to have a long term plan," was further response. Verbal expression continues as limited and contains primarily neologism and phonemic paraphasias in his error patterns. Pt needs speech and language skills for his occupation.   OBJECTIVE IMPAIRMENTS: include expressive language, receptive language, aphasia, and dysphagia. These impairments are limiting patient from return to work, household responsibilities, ADLs/IADLs, and effectively communicating at home and in community. Factors affecting potential to achieve goals and functional outcome are severity of impairments and limited insurance coverage for ST . Patient will benefit from skilled SLP services to address above impairments and improve overall function.  REHAB POTENTIAL: Good  PLAN:  SLP FREQUENCY: 1-2x/week  SLP DURATION:  20 sessions  PLANNED INTERVENTIONS: Language facilitation, Environmental controls, Trials of upgraded texture/liquids, Cueing hierachy, Internal/external aids, Functional tasks, Multimodal communication approach, SLP instruction and feedback, Compensatory strategies, and Patient/family education    [facility], [place] 09/20/2022, 4:24 PM

## 2022-09-27 ENCOUNTER — Ambulatory Visit: Payer: PRIVATE HEALTH INSURANCE

## 2022-09-27 ENCOUNTER — Ambulatory Visit: Payer: PRIVATE HEALTH INSURANCE | Attending: Internal Medicine | Admitting: Occupational Therapy

## 2022-09-27 DIAGNOSIS — R41840 Attention and concentration deficit: Secondary | ICD-10-CM | POA: Diagnosis present

## 2022-09-27 DIAGNOSIS — I69318 Other symptoms and signs involving cognitive functions following cerebral infarction: Secondary | ICD-10-CM | POA: Insufficient documentation

## 2022-09-27 DIAGNOSIS — R278 Other lack of coordination: Secondary | ICD-10-CM | POA: Insufficient documentation

## 2022-09-27 DIAGNOSIS — I69353 Hemiplegia and hemiparesis following cerebral infarction affecting right non-dominant side: Secondary | ICD-10-CM | POA: Insufficient documentation

## 2022-09-27 DIAGNOSIS — R4701 Aphasia: Secondary | ICD-10-CM | POA: Diagnosis present

## 2022-09-27 DIAGNOSIS — R208 Other disturbances of skin sensation: Secondary | ICD-10-CM | POA: Insufficient documentation

## 2022-09-27 NOTE — Therapy (Signed)
OUTPATIENT OCCUPATIONAL THERAPY NEURO  Treatment Note  Patient Name: Cameron Hudson MRN: [id number] DOB:10/08/1965, 57 y.o., male Today's Date: 09/27/2022  PCP: Shon Baton, MD REFERRING PROVIDER: Shon Baton, MD  END OF SESSION:  OT End of Session - 09/27/22 1028     Visit Number 2    Number of Visits 17    Date for OT Re-Evaluation 11/10/22    Authorization Type [employer] / [employer] [id number]    Authorization - Number of Visits 20   OT/PT combined   OT Start Time 1020    OT Stop Time 1100    OT Time Calculation (min) 40 min              Past Medical History:  Diagnosis Date   Allergy    Asthma    CAD S/P percutaneous coronary angioplasty 06/20/2017   Inferior STEMI - 100% dRCA --> DES PCI with Synergy 3.0 x 24 (3.3 mm). Mild 10-30% disease in p-mRCA, Cx & LAD.  EF Normal.    Hemorrhoids    Hx of adenomatous polyp of colon 08/01/2019   Hyperlipidemia    IBS (irritable bowel syndrome)    Personal history of colonic polyps 2004   12 mm adenoma   Seasonal allergies    STEMI involving oth coronary artery of inferior wall 06/20/2017   12/18 PCI/DESx1 to dRCA, normal EF   Past Surgical History:  Procedure Laterality Date   CHOLECYSTECTOMY N/A 11/28/2019   Procedure: LAPAROSCOPIC CHOLECYSTECTOMY;  Surgeon: Coralie Keens, MD;  Location: [facility];  Service: General;  Laterality: N/A;   COLONOSCOPY     COLONOSCOPY W/ POLYPECTOMY  08/27/2002; 08/30/2005; 01/06/2011   48mm adenoma, diverticulosis, hemorrhoids; normal;  diverticulosis and small hemorrhoids   CORONARY STENT INTERVENTION N/A 06/20/2017   Procedure: CORONARY STENT INTERVENTION;  Surgeon: Troy Sine, MD;  Location: [facility];  Service: Cardiovascular;  Laterality: N/A;   LEFT HEART CATH AND CORONARY ANGIOGRAPHY N/A 06/20/2017   Procedure: LEFT HEART CATH AND CORONARY ANGIOGRAPHY;  Surgeon: Troy Sine, MD;  Location: [facility];  Service: Cardiovascular;  Laterality: N/A;    NASAL SINUS SURGERY  Dec 2011   POLYPECTOMY     Patient Active Problem List   Diagnosis Date Noted   Hx of adenomatous polyp of colon 08/01/2019   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   IFG (impaired fasting glucose) 08/11/2017   Hypogonadism, male 08/11/2017   Statin intolerance 08/09/2017   Encounter for long-term (current) use of medications 08/09/2017   Hyperlipidemia with target low density lipoprotein (LDL) cholesterol less than 70 mg/dL 06/22/2017   STEMI involving oth coronary artery of inferior wall 06/20/2017   CAD S/P percutaneous coronary angioplasty 06/20/2017   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   Acute pain of right shoulder 10/07/2016   Radicular pain 08/09/2016   Seasonal allergies 07/31/2013   History of chronic sinusitis 05/11/2012   Allergy 05/11/2012   Asthma, exercise induced 10/09/2010   Chronic sinusitis 10/09/2010   Perennial allergic rhinitis with seasonal variation 10/09/2010    ONSET DATE: 09/06/22  REFERRING DIAG: I63.9 (ICD-10-CM) - CVA (cerebral vascular accident)  THERAPY DIAG:  Hemiplegia and hemiparesis following cerebral infarction affecting right non-dominant side  Other disturbances of skin sensation  Other lack of coordination  Attention and concentration deficit  Rationale for Evaluation and Treatment: Rehabilitation  SUBJECTIVE:   SUBJECTIVE STATEMENT: Pt reports that he feels like he is always going at a "slow pace". Pt accompanied by: self and significant other (  spouse [name])  PERTINENT HISTORY: Coronary artery disease, s/p inf STEMI and DES to occluded RCA in 05/2017  PRECAUTIONS: None  WEIGHT BEARING RESTRICTIONS: No  PAIN:  Are you having pain? No  FALLS: Has patient fallen in last 6 months? Yes. Number of falls 1 - collapsed with onset of CVA  LIVING ENVIRONMENT: Lives with: lives with their family (32 and 62 yo daughters) Lives in: House/apartment Stairs: Yes: Internal: able to remain on main level will full  bedroom/bathroom, does have 2nd floor - children's bedrooms are upstairs steps; on right going up and External: 4-5 steps; on right going up Has following equipment at home: None  PLOF: Independent and Independent with basic ADLs, sees pt's 2 days per week and admin 1.5 days  PATIENT GOALS: to make sure that I don't need OT  OBJECTIVE:   HAND DOMINANCE: Left  ADLs: Overall ADLs: Independent with bathing/dressing and bathroom transfers, no difficulty with clothing fasteners Transfers/ambulation related to ADLs: Independent Equipment: none  IADLs: Pt just returned home from hospital yesterday, has not yet attempted any IADLs.  Pt reports that he feels he could do them, but has not had the opportunity to attempt. Medication management: Spouse is currently assisting with dispensing medications  Financial management: Did go in to the clinic to do some payroll with supervision Handwriting: 100% legible  MOBILITY STATUS: Independent  POSTURE COMMENTS:  No Significant postural limitations  ACTIVITY TOLERANCE: Activity tolerance: WFL for tasks assessed on eval  FUNCTIONAL OUTCOME MEASURES: FOTO: 91, predicted 98 at d/c  UPPER EXTREMITY ROM:  WFL bilaterally   UPPER EXTREMITY MMT:   Grossly 5/5 bilaterally  HAND FUNCTION: Grip strength: Right: 90 lbs; Left: 100 lbs, Lateral pinch: Right: 23 lbs, Left: 21 lbs, and 3 point pinch: Right: 20 lbs, Left: 17 lbs  COORDINATION: Finger Nose Finger test: R mild decreased coordination and speed compared to L 9 Hole Peg test: Right: 28.78 sec; Left: 20.10 sec  SENSATION: Slight decreased sensation in R hand, along ulnar distribution  COGNITION: Overall cognitive status: Within functional limits for tasks assessed and requires slowing with verbal cues to allow for increased time to process. Pt with difficulty with multi-step directions  VISION: Subjective report: no issues Baseline vision: Wears glasses for reading only  PRAXIS:  Impaired: possible mild apraxia, however difficult to assess due to aphasia   TODAY'S TREATMENT:                                                              09/27/22 Engaged in small peg board pattern replication with use of key to identify correlating colors, challenging alternating attention, visual perception, and R hand coordination and dexterity. Pt initially picking up pegs with L hand and utilizing R hand to place into pincer grasp to place pegs into peg board.  OT encouraged pt to pick up one at a time with R hand to challenge motor control and coordination.  Pt placing pegs in color order, not in linear order but not making any errors despite need to visually scan increased times. Educated on functional carryover of task implications with systematic process to minimize onset of errors. Coordination: with RUE including: picking up various small objects/coins and trying to see how many pt is able to hold in-hand without drops, picking up  coins and stacking them, and picking up 5-10 coins 1 at a time and translating palm to fingertips to place in a container or coin slot, rotating coins in finger tips, rotating pen between fingers.  Pt reports difficulty with coordination with smaller/flatter items and picking up with ring and small fingers.  OT educated on ways to increase/decrease challenge with size and width of items and utilizing LUE as needed to assess function when in doubt. Digiflex: finger and hand strengthening with medium resistance digiflex.  OT encouraged pt to complete strengthening with each finger in succession and then whole hand to challenge pinch grip/opposition and full grasp.  Pt demonstrated x5 with good strength, demonstrating decreased in 4th and 5th digits but overall understanding of activity. Peg board: engaged in small peg board pattern replication with increased focus on in-hand manipulation and translation while naming items in category.  Pt demonstrating drops 25% of time  especially when incorporating increased cognitive dual tasking challenge.  Due to aphasia, pt with increased difficulty with naming items in category but able to write items.  Pt with improved coordination when dual tasking removed.    09/13/22 Engaged in education on impaired cognition and aphasia impacting dual tasking, following multi-step commands and it's impact on both work, IADLs, and leisure pursuits.  PATIENT EDUCATION: Education details: Educated on role and purpose of OT as well as potential interventions and goals for therapy based on initial evaluation findings. Person educated: Patient and Spouse Education method: Explanation Education comprehension: verbalized understanding  HOME EXERCISE PROGRAM: TBD   GOALS: Goals reviewed with patient? Yes  SHORT TERM GOALS: Target date: 10/13/22  Pt will be Independent with coordination HEP to increase independence with ADLs/IADLs Baseline: Goal status: IN PROGRESS  2.  Pt will complete table top scanning activity without cues, demonstrating improved sustained attention to task. Baseline:  Goal status: IN PROGRESS  3.  Pt will complete dual task activity in mild distracting environment with 90% accuracy. Baseline:  Goal status: IN PROGRESS   LONG TERM GOALS: Target date: 11/10/22  Pt will demonstrate improved fine motor coordination for ADLs as evidenced by decreasing 9 hole peg test score for RUE by 5 secs Baseline: Right: 28.78 sec; Left: 20.10 sec Goal status: IN PROGRESS  2.  Pt will be able to engage in dual task activity with 100% accuracy to demonstrate increase awareness, organization, and sequencing. Baseline:  Goal status: IN PROGRESS  3.  Pt will complete a moderately challenging bill paying/computer task <2 errors due to aphasia. Baseline:  Goal status: IN PROGRESS  4.  Pt will complete a 3 step task to carryover to IADL/work tasks with <1 cue for recall/sequencing. Baseline:  Goal status: IN  PROGRESS   ASSESSMENT:  CLINICAL IMPRESSION: Pt demonstrating decreased coordination in RUE, particularly with smaller items, completing translation from palm to finger tips, and when increased challenge with cognitive dual tasking.  Pt demonstrating decreased strength and sensation particularly in 4th and 5th digits impacting motor control and strength when rotating pen, and reports when attempting to hold dumbbells.  PERFORMANCE DEFICITS: in functional skills including IADLs, coordination, sensation, Fine motor control, Gross motor control, endurance, and UE functional use, cognitive skills including attention, memory, orientation, problem solving, safety awareness, sequencing, thought, and understand, and psychosocial skills including environmental adaptation, habits, and routines and behaviors.   IMPAIRMENTS: are limiting patient from IADLs, work, leisure, and social participation.   CO-MORBIDITIES: may have co-morbidities  that affects occupational performance. Patient will benefit from skilled OT  to address above impairments and improve overall function.  MODIFICATION OR ASSISTANCE TO COMPLETE EVALUATION: No modification of tasks or assist necessary to complete an evaluation.  OT OCCUPATIONAL PROFILE AND HISTORY: Problem focused assessment: Including review of records relating to presenting problem.  CLINICAL DECISION MAKING: LOW - limited treatment options, no task modification necessary  REHAB POTENTIAL: Good  EVALUATION COMPLEXITY: Low    PLAN:  OT FREQUENCY: 1-2x/week  OT DURATION: 8 weeks  PLANNED INTERVENTIONS: self care/ADL training, therapeutic exercise, therapeutic activity, neuromuscular re-education, functional mobility training, electrical stimulation, ultrasound, compression bandaging, moist heat, cryotherapy, patient/family education, cognitive remediation/compensation, psychosocial skills training, energy conservation, coping strategies training, and DME and/or AE  instructions  RECOMMENDED OTHER SERVICES: NA  CONSULTED AND AGREED WITH PLAN OF CARE: Patient and family member/caregiver  PLAN FOR NEXT SESSION: hand gripper, yellow or red theraputty with removing beads from putty (with tweezers?), dual tasking and multi-step commands for sequencing   Lakeitha Basques, OTR/L 09/27/2022, 12:28 PM

## 2022-09-27 NOTE — Therapy (Signed)
OUTPATIENT SPEECH LANGUAGE PATHOLOGY APHASIA TREATMENT   Patient Name: Cameron Hudson MRN: [id number] DOB:1966/05/07, 57 y.o., male Today's Date: 09/27/2022  PCP: Shon Baton, MD REFERRING PROVIDER: [facility] MD Virgina Jock- Doc)  END OF SESSION:  End of Session - 09/27/22 1405     Visit Number 5    Number of Visits 17    Date for SLP Re-Evaluation 11/22/22    Authorization Time Period ST= 20 visits (per case mgr)    Authorization - Visit Number 5    Authorization - Number of Visits 20    SLP Start Time [phone]    SLP Stop Time  0930    SLP Time Calculation (min) 40 min    Activity Tolerance Patient tolerated treatment well               Past Medical History:  Diagnosis Date   Allergy    Asthma    CAD S/P percutaneous coronary angioplasty 06/20/2017   Inferior STEMI - 100% dRCA --> DES PCI with Synergy 3.0 x 24 (3.3 mm). Mild 10-30% disease in p-mRCA, Cx & LAD.  EF Normal.    Hemorrhoids    Hx of adenomatous polyp of colon 08/01/2019   Hyperlipidemia    IBS (irritable bowel syndrome)    Personal history of colonic polyps 2004   12 mm adenoma   Seasonal allergies    STEMI involving oth coronary artery of inferior wall 06/20/2017   12/18 PCI/DESx1 to dRCA, normal EF   Past Surgical History:  Procedure Laterality Date   CHOLECYSTECTOMY N/A 11/28/2019   Procedure: LAPAROSCOPIC CHOLECYSTECTOMY;  Surgeon: Coralie Keens, MD;  Location: [facility];  Service: General;  Laterality: N/A;   COLONOSCOPY     COLONOSCOPY W/ POLYPECTOMY  08/27/2002; 08/30/2005; 01/06/2011   3mm adenoma, diverticulosis, hemorrhoids; normal;  diverticulosis and small hemorrhoids   CORONARY STENT INTERVENTION N/A 06/20/2017   Procedure: CORONARY STENT INTERVENTION;  Surgeon: Troy Sine, MD;  Location: [facility];  Service: Cardiovascular;  Laterality: N/A;   LEFT HEART CATH AND CORONARY ANGIOGRAPHY N/A 06/20/2017   Procedure: LEFT HEART CATH AND CORONARY ANGIOGRAPHY;   Surgeon: Troy Sine, MD;  Location: [facility];  Service: Cardiovascular;  Laterality: N/A;   NASAL SINUS SURGERY  Dec 2011   POLYPECTOMY     Patient Active Problem List   Diagnosis Date Noted   Hx of adenomatous polyp of colon 08/01/2019   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   IFG (impaired fasting glucose) 08/11/2017   Hypogonadism, male 08/11/2017   Statin intolerance 08/09/2017   Encounter for long-term (current) use of medications 08/09/2017   Hyperlipidemia with target low density lipoprotein (LDL) cholesterol less than 70 mg/dL 06/22/2017   STEMI involving oth coronary artery of inferior wall 06/20/2017   CAD S/P percutaneous coronary angioplasty 06/20/2017   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 Pt accompanied by: significant other wife-Jill  PERTINENT HISTORY: HLD, CAD,S/P one stent  PAIN: Are you having pain? No  FALLS: Has patient fallen in last 6 months?  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   PATIENT REPORTED OUTCOME MEASURES (PROM): Communication Effectiveness Survey: (CES) :Pt filled out CES  and scored himself 13/32 (higher numbers indicate better QOL/effective communication). "1" were scored for participating in conversation with strangers in a quiet place,  conversing with a stranger over the telephone, conversing in a noisy environment, and having a conversation with someone at a distance. [name] scored pt 10/32, with "1" for participating in a conversation with strangers in a quiet place, with a familiar person over the telephone, with a stranger over the telephone, in a noisy environment, with a friend when emotionally upset or angry, and having a conversation with someone at a distance.. Pt's higher score likely indicates his awareness of errors is not accurate due to his aphasia.   TODAY'S TREATMENT:                                                                                                                                          09/27/22:SLP provided CES today for both pt and [name]; Results above. SLP targeted functional language with salient and high interest topics in simple conversation. Pt was nearly functional but req'd SLP questioning cues rarely. Pt augmented verbal communication with written communication on a pad successfully 90% of the time. Pt's spontaneous speech is more grammatically correct and more prevalent today than in previous sessions.   SLP then linked clinician CT to pt's CT profile.   09/20/22: SLP targeted expressive and receptive language. SLP modeled practice for home tasks and provided wife with suggested cues for target word. Pt benefited today from semantic cues. Pt filled in 2 letters for body parts with 4/6 success and incr'd to 6/6 with min A. Written cues from pt did not appear to assist verbal expression. Neologism and semantic errors prevalent, with incr'd awareness than Monday.  09/18/22:QAB completed today. Score above. Pt with less frustration today - self-calming behavior, "It's ok it's ok" x4. SLP provided education on Constant Therapy telling pt/wife that pt could work on it if frustration level does not inhibit ability to complete. Homework to say family names, other automatic speech, and cloze phrases/sentences.   SLP provided clinic number for [facility] and suggested wife call to obtain information about possible summer sessions.  09/14/22: Expressed frustration re: hwk for sentence completions. Demo'd good comprehension of task, correct completion of sentences, and accurate writing; however, usual verbal reading errors noted, particularly content words. Reduced awareness of errors demonstrated initially, which improved throughout session. SLP educated rationale of task, including task hierarchy (starting with easier, more automatic tasks and then advancing) and targeted deficits (comprehension and expression). Writing noted as relative strength with pt often writing key words to optimize  listener comprehension given usual neologisms and paraphasias. SLP targeted verbal expression of personally relevant words from his personal notebook. SLP demonstrated recommended cueing hierarchy to maximize patient performance. Pt benefited from usual verbal/visual modeling fading to visual modeling (mouthing sounds) to saying word in response to a question to saying word independently with intermittent choral practice needed. See patient instructions for additional education and HEP. Briefly discussed auditory comprehension strategies (slower rate, repetition, rephrasing, confirmation); additional education warranted.   PATIENT EDUCATION: Education details: see above Person educated: Patient and Spouse Education method: Explanation, Demonstration, Verbal cues, and Handouts Education comprehension: verbalized understanding, verbal cues required, and needs further education   GOALS: Goals reviewed with patient? Yes  SHORT TERM GOALS: Target date: 10/15/22  Pt will name average 8 items in salient, practical categories in 2 sessions Baseline: Goal status: IN PROGRESS  2.  Pt will communicate basic needs/wants with use of compensatory strategies in 3 sessions Baseline:  Goal status: IN PROGRESS  3.  Pt will demo  understanding of sentence stimuli in structured tasks in 3 sessions Baseline:  Goal status: IN PROGRESS  4.  With extra time, pt will demo understanding of 2-spoken sentence stimuli in 3 sessions Baseline:  Goal status: IN PROGRESS   LONG TERM GOALS: Target date: 11/22/22  Pt will name average 8 items in simple categories (non-personal for pt) in 3 sessions Baseline:  Goal status: IN PROGRESS  2.  Pt will produce MLU of >6.5 average in 3-minute speech sample  Baseline:  Goal status: IN PROGRESS  3.  Pt family will demo appropriate cueing for pt's receptive and expressive language in 5 sessions Baseline:  Goal status: IN PROGRESS  4.  Pt will score higher on PROM than on initial reading in first 2 sessions Baseline:  Goal status: IN PROGRESS   ASSESSMENT:  CLINICAL IMPRESSION: Patient is a 57 y.o. male who was seen today for treatment of expressive and receptive language. Verbal expression appears better today than in previous sessions - pt was nearly functional in 10 minutes simple conversation using written cues to augment communication. Verbal expression contains neologism and phonemic paraphasias. 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  OBJECTIVE IMPAIRMENTS: include expressive language, receptive language, aphasia, and dysphagia. These impairments are limiting patient from return to work, household responsibilities, ADLs/IADLs, and effectively communicating at home and in community. Factors affecting potential to achieve goals and functional outcome are severity of impairments and limited insurance coverage for ST . Patient will benefit from skilled SLP services to address above impairments and improve overall function.  REHAB POTENTIAL: Good  PLAN:  SLP FREQUENCY: 1-2x/week  SLP DURATION:  20 sessions  PLANNED INTERVENTIONS: Language facilitation, Environmental controls, Trials of upgraded texture/liquids, Cueing hierachy,  Internal/external aids, Functional tasks, Multimodal communication approach, SLP instruction and feedback, Compensatory strategies, and Patient/family education    [facility], [place] 09/27/2022, 2:06 PM

## 2022-09-28 ENCOUNTER — Encounter: Payer: Self-pay | Admitting: Diagnostic Neuroimaging

## 2022-09-28 ENCOUNTER — Ambulatory Visit (INDEPENDENT_AMBULATORY_CARE_PROVIDER_SITE_OTHER): Payer: No Typology Code available for payment source | Admitting: Diagnostic Neuroimaging

## 2022-09-28 ENCOUNTER — Ambulatory Visit: Payer: PRIVATE HEALTH INSURANCE

## 2022-09-28 ENCOUNTER — Ambulatory Visit: Payer: PRIVATE HEALTH INSURANCE | Admitting: Occupational Therapy

## 2022-09-28 VITALS — BP 116/60 | HR 64 | Ht 69.0 in | Wt 160.0 lb

## 2022-09-28 DIAGNOSIS — I69353 Hemiplegia and hemiparesis following cerebral infarction affecting right non-dominant side: Secondary | ICD-10-CM

## 2022-09-28 DIAGNOSIS — I63412 Cerebral infarction due to embolism of left middle cerebral artery: Secondary | ICD-10-CM | POA: Diagnosis not present

## 2022-09-28 DIAGNOSIS — Z8673 Personal history of transient ischemic attack (TIA), and cerebral infarction without residual deficits: Secondary | ICD-10-CM | POA: Diagnosis not present

## 2022-09-28 DIAGNOSIS — R4701 Aphasia: Secondary | ICD-10-CM

## 2022-09-28 DIAGNOSIS — R278 Other lack of coordination: Secondary | ICD-10-CM

## 2022-09-28 DIAGNOSIS — R41840 Attention and concentration deficit: Secondary | ICD-10-CM

## 2022-09-28 DIAGNOSIS — R208 Other disturbances of skin sensation: Secondary | ICD-10-CM

## 2022-09-28 NOTE — Therapy (Signed)
OUTPATIENT SPEECH LANGUAGE PATHOLOGY APHASIA TREATMENT   Patient Name: Cameron Hudson MRN: [id number] DOB:[birth year], 57 y.o., male Today's Date: 09/28/2022  PCP: Shon Baton, MD REFERRING PROVIDER: [facility] MD ([place])  END OF SESSION:  End of Session - 09/28/22 1742     Visit Number 6    Number of Visits 17    Date for SLP Re-Evaluation 11/22/22    Authorization Time Period ST= 20 visits (per case mgr)    Authorization - Visit Number 6    Authorization - Number of Visits 20    SLP Start Time 1532    SLP Stop Time  1618    SLP Time Calculation (min) 46 min    Activity Tolerance Patient tolerated treatment well                Past Medical History:  Diagnosis Date   Allergy    Asthma    CAD S/P percutaneous coronary angioplasty 06/20/2017   Inferior STEMI - 100% dRCA --> DES PCI with Synergy 3.0 x 24 (3.3 mm). Mild 10-30% disease in p-mRCA, Cx & LAD.  EF Normal.    Hemorrhoids    Hx of adenomatous polyp of colon 08/01/2019   Hyperlipidemia    IBS (irritable bowel syndrome)    Personal history of colonic polyps 2004   12 mm adenoma   Seasonal allergies    STEMI involving oth coronary artery of inferior wall 06/20/2017   12/18 PCI/DESx1 to dRCA, normal EF   Past Surgical History:  Procedure Laterality Date   CHOLECYSTECTOMY N/A 11/28/2019   Procedure: LAPAROSCOPIC CHOLECYSTECTOMY;  Surgeon: Coralie Keens, MD;  Location: [facility];  Service: General;  Laterality: N/A;   COLONOSCOPY     COLONOSCOPY W/ POLYPECTOMY  08/27/2002; 08/30/2005; 01/06/2011   14mm adenoma, diverticulosis, hemorrhoids; normal;  diverticulosis and small hemorrhoids   CORONARY STENT INTERVENTION N/A 06/20/2017   Procedure: CORONARY STENT INTERVENTION;  Surgeon: Troy Sine, MD;  Location: [facility];  Service: Cardiovascular;  Laterality: N/A;   LEFT HEART CATH AND CORONARY ANGIOGRAPHY N/A 06/20/2017   Procedure: LEFT HEART CATH AND CORONARY ANGIOGRAPHY;   Surgeon: Troy Sine, MD;  Location: [facility];  Service: Cardiovascular;  Laterality: N/A;   NASAL SINUS SURGERY  Dec 2011   POLYPECTOMY     Patient Active Problem List   Diagnosis Date Noted   Hx of adenomatous polyp of colon 08/01/2019   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   IFG (impaired fasting glucose) 08/11/2017   Hypogonadism, male 08/11/2017   Statin intolerance 08/09/2017   Encounter for long-term (current) use of medications 08/09/2017   Hyperlipidemia with target low density lipoprotein (LDL) cholesterol less than 70 mg/dL 06/22/2017   STEMI involving oth coronary artery of inferior wall 06/20/2017   CAD S/P percutaneous coronary angioplasty 06/20/2017   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 Pt accompanied by: significant other wife-Jill  PERTINENT HISTORY: HLD, CAD,S/P one stent  PAIN: Are you having pain? No  FALLS: Has  patient fallen in last 6 months?  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   PATIENT REPORTED OUTCOME MEASURES (PROM): Communication Effectiveness Survey: (CES) :Pt filled out CES  and scored himself 13/32 (higher numbers indicate better QOL/effective communication). "1" were scored for participating in conversation with strangers in a quiet place, conversing with a stranger over the telephone,  conversing in a noisy environment, and having a conversation with someone at a distance. [name] scored pt 10/32, with "1" for participating in a conversation with strangers in a quiet place, with a familiar person over the telephone, with a stranger over the telephone, in a noisy environment, with a friend when emotionally upset or angry, and having a conversation with someone at a distance.. Pt's higher score likely indicates his awareness of errors is not accurate due to his aphasia.   TODAY'S TREATMENT:                                                                                                                                          09/28/22: Pt used written augmentation extensively today in 15-minutes simple to mod complex conversation with SLP and was nearly functional with SLP semantic and articulatory cues. SLP educated wife how to use semantic cues with pt today via model and direct instruction. Pt cont to work with constant therapy at home 40-70 minutes/day.  09/27/22:SLP provided CES today for both pt and [name]; Results above. SLP targeted functional language with salient and high interest topics in simple conversation. Pt was nearly functional but req'd SLP questioning cues rarely. Pt augmented verbal communication with written communication on a pad successfully 90% of the time. Pt's spontaneous speech is more grammatically correct and more prevalent today than in previous sessions.   SLP then linked clinician CT to pt's CT profile.   09/20/22: SLP targeted expressive and receptive language. SLP modeled practice for home tasks and provided wife with suggested cues for target word. Pt benefited today from semantic cues. Pt filled in 2 letters for body parts with 4/6 success and incr'd to 6/6 with min A. Written cues from pt did not appear to assist verbal expression. Neologism and semantic errors prevalent, with incr'd awareness than Monday.  09/18/22:QAB completed today. Score above. Pt with less  frustration today - self-calming behavior, "It's ok it's ok" x4. SLP provided education on Constant Therapy telling pt/wife that pt could work on it if frustration level does not inhibit ability to complete. Homework to say family names, other automatic speech, and cloze phrases/sentences.  SLP provided clinic number for [facility] and suggested wife call to obtain information about possible summer sessions.  09/14/22: Expressed frustration re: hwk for sentence completions. Demo'd good comprehension of task, correct completion of sentences, and accurate writing; however, usual verbal reading errors noted, particularly content words. Reduced  awareness of errors demonstrated initially, which improved throughout session. SLP educated rationale of task, including task hierarchy (starting with easier, more automatic tasks and then advancing) and targeted deficits (comprehension and expression). Writing noted as relative strength with pt often writing key words to optimize listener comprehension given usual neologisms and paraphasias. SLP targeted verbal expression of personally relevant words from his personal notebook. SLP demonstrated recommended cueing hierarchy to maximize patient performance. Pt benefited from usual verbal/visual modeling fading to visual modeling (mouthing sounds) to saying word in response to a question to saying word independently with intermittent choral practice needed. See patient instructions for additional education and HEP. Briefly discussed auditory comprehension strategies (slower rate, repetition, rephrasing, confirmation); additional education warranted.   PATIENT EDUCATION: Education details: see above Person educated: Patient and Spouse Education method: Explanation, Demonstration, Verbal cues, and Handouts Education comprehension: verbalized understanding, verbal cues required, and needs further education   GOALS: Goals reviewed with patient? Yes  SHORT TERM GOALS: Target  date: 10/15/22  Pt will name average 8 items in salient, practical categories in 2 sessions Baseline: Goal status: IN PROGRESS  2.  Pt will communicate basic needs/wants with use of compensatory strategies in 3 sessions Baseline:  Goal status: IN PROGRESS  3.  Pt will demo understanding of sentence stimuli in structured tasks in 3 sessions Baseline:  Goal status: IN PROGRESS  4.  With extra time, pt will demo understanding of 2-spoken sentence stimuli in 3 sessions Baseline:  Goal status: IN PROGRESS   LONG TERM GOALS: Target date: 11/22/22  Pt will name average 8 items in simple categories (non-personal for pt) in 3 sessions Baseline:  Goal status: IN PROGRESS  2.  Pt will produce MLU of >6.5 average in 3-minute speech sample  Baseline:  Goal status: IN PROGRESS  3.  Pt family will demo appropriate cueing for pt's receptive and expressive language in 5 sessions Baseline:  Goal status: IN PROGRESS  4.  Pt will score higher on PROM than on initial reading in first 2 sessions Baseline:  Goal status: IN PROGRESS   ASSESSMENT:  CLINICAL IMPRESSION: Patient is a 57 y.o. male who was seen today for treatment of expressive and receptive language. Verbal expression appears better today than in previous sessions - pt was nearly functional in 10 minutes simple conversation using written cues to augment communication. Verbal expression contains neologism and phonemic paraphasias. 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  OBJECTIVE IMPAIRMENTS: include expressive language, receptive language, aphasia, and dysphagia. These impairments are limiting patient from return to work, household responsibilities, ADLs/IADLs, and effectively communicating at home and in community. Factors affecting potential to achieve goals and functional outcome are severity of impairments and limited insurance coverage for ST . Patient will benefit from skilled SLP  services to address above impairments and improve overall function.  REHAB POTENTIAL: Good  PLAN:  SLP FREQUENCY: 1-2x/week  SLP DURATION:  20 sessions  PLANNED INTERVENTIONS: Language facilitation, Environmental controls, Trials of upgraded texture/liquids, Cueing hierachy, Internal/external aids, Functional tasks, Multimodal communication approach, SLP instruction and feedback, Compensatory strategies, and Patient/family education    [facility], [place] 09/28/2022, 5:42 PM

## 2022-09-28 NOTE — Progress Notes (Signed)
[facility]  PATIENT: Cameron Hudson DOB: 27-Sep-1965  REFERRING CLINICIAN: Creola Corn, MD HISTORY FROM: patient  REASON FOR VISIT: new consult   HISTORICAL  CHIEF COMPLAINT:  Chief Complaint  Patient presents with   New Patient (Initial Visit)    Patient in room #6 with his wife. Pt states he here today to discuss his stroke.    HISTORY OF PRESENT ILLNESS:   57 year old left-handed male with hypertension, hyperlipidemia, coronary artery disease status post stent, here for evaluation of stroke.  Patient went to [place] for plastic surgery cosmetic procedure (facelift and liposuction) on 09/02/2022.  Patient was recovering.  On 09/06/2022 he woke up at 4:30 in the morning and went to the hotel gym.  He was using some light weights.  When he put the dumbbells back he noticed right arm weakness.  He was having slurred speech trouble talking.  He went to the hotel lobby to ask for help but unfortunately they did not recognize the patient or that these were strokelike symptoms.  Eventually he collapsed and 9 1 was called.  He was taken to local hospital and then sent to comprehensive stroke center.  He was treated with mechanical thrombectomy.  He was found to have DVT and PE as well as positive lupus anticoagulant blood test.  Small PFO also detected.  He was discharged on Eliquis and blood pressure medication control.  Since that time symptoms have improved.  Still has some mild residual word finding difficulties.  Still has some dexterity issues with the right hand.  He is able to write down words using his left hand better than he is able to save them.  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  He has not returned to work.   REVIEW OF SYSTEMS: Full 14 system review of systems performed and negative with exception of: as per HPI.  ALLERGIES: Allergies  Allergen Reactions   Advair Diskus [Fluticasone-Salmeterol] Other (See Comments)    Increased BP   Cheese Other  (See Comments)    GI upset    Statins Other (See Comments)    Muscle pain   Adhesive [Tape] Rash    After a few days   Sulfa Antibiotics Rash    HOME MEDICATIONS: Outpatient Medications Prior to Visit  Medication Sig Dispense Refill   apixaban (ELIQUIS) 5 MG TABS tablet Take 5 mg by mouth 2 (two) times daily.     ezetimibe (ZETIA) 10 MG tablet Take 1 tablet by mouth once daily 90 tablet 0   losartan (COZAAR) 50 MG tablet Take 50 mg by mouth daily.     nebivolol (BYSTOLIC) 10 MG tablet Take 10 mg by mouth daily.     ALBUTEROL IN Inhale into the lungs. PRN (Patient not taking: Reported on 09/28/2022)     aspirin 81 MG chewable tablet Chew 1 tablet (81 mg total) by mouth daily. (Patient not taking: Reported on 09/28/2022)     Evolocumab (REPATHA SURECLICK) 140 MG/ML SOAJ Inject 140 mLs into the skin every 30 (thirty) days. (Patient not taking: Reported on 09/28/2022)     No facility-administered medications prior to visit.    PAST MEDICAL HISTORY: Past Medical History:  Diagnosis Date   Allergy    Asthma    CAD S/P percutaneous coronary angioplasty 06/20/2017   Inferior STEMI - 100% dRCA --> DES PCI with Synergy 3.0 x 24 (3.3 mm). Mild 10-30% disease in p-mRCA, Cx & LAD.  EF Normal.    Hemorrhoids    Hx  of adenomatous polyp of colon 08/01/2019   Hyperlipidemia    IBS (irritable bowel syndrome)    Personal history of colonic polyps 2004   12 mm adenoma   Seasonal allergies    STEMI involving oth coronary artery of inferior wall 06/20/2017   12/18 PCI/DESx1 to dRCA, normal EF    PAST SURGICAL HISTORY: Past Surgical History:  Procedure Laterality Date   CHOLECYSTECTOMY N/A 11/28/2019   Procedure: LAPAROSCOPIC CHOLECYSTECTOMY;  Surgeon: Abigail Miyamoto, MD;  Location: [facility];  Service: General;  Laterality: N/A;   COLONOSCOPY     COLONOSCOPY W/ POLYPECTOMY  08/27/2002; 08/30/2005; 01/06/2011   12mm adenoma, diverticulosis, hemorrhoids; normal;  diverticulosis and small  hemorrhoids   CORONARY STENT INTERVENTION N/A 06/20/2017   Procedure: CORONARY STENT INTERVENTION;  Surgeon: Lennette Bihari, MD;  Location: [facility];  Service: Cardiovascular;  Laterality: N/A;   LEFT HEART CATH AND CORONARY ANGIOGRAPHY N/A 06/20/2017   Procedure: LEFT HEART CATH AND CORONARY ANGIOGRAPHY;  Surgeon: Lennette Bihari, MD;  Location: [facility];  Service: Cardiovascular;  Laterality: N/A;   NASAL SINUS SURGERY  Dec 2011   POLYPECTOMY      FAMILY HISTORY: Family History  Problem Relation Age of Onset   Allergies Father    Arrhythmia Father        cardiologist not worried   Arrhythmia Mother        not clear if a fib or not   Stroke Mother    Colon cancer Neg Hx    Colon polyps Neg Hx    Esophageal cancer Neg Hx    Rectal cancer Neg Hx    Stomach cancer Neg Hx     SOCIAL HISTORY: Social History   Socioeconomic History   Marital status: Married    Spouse name: Not on file   Number of children: Not on file   Years of education: Not on file   Highest education level: Not on file  Occupational History   Occupation: Orthodontist  Tobacco Use   Smoking status: Never   Smokeless tobacco: Never  Vaping Use   Vaping Use: Never used  Substance and Sexual Activity   Alcohol use: Yes    Alcohol/week: 1.0 standard drink of alcohol    Types: 1 Glasses of wine per week    Comment: 1 to 2 drinks per month   Drug use: No   Sexual activity: Not on file  Other Topics Concern   Not on file  Social History Narrative   Not on file   Social Determinants of Health   Financial Resource Strain: Not on file  Food Insecurity: Not on file  Transportation Needs: Not on file  Physical Activity: Not on file  Stress: Not on file  Social Connections: Not on file  Intimate Partner Violence: Not on file     PHYSICAL EXAM  GENERAL EXAM/CONSTITUTIONAL: Vitals:  Vitals:   09/28/22 1331  BP: 116/60  Pulse: 64  Weight: 160 lb (72.6 kg)  Height: 5\' 9"   (1.753 m)   Body mass index is 23.63 kg/m. Wt Readings from Last 3 Encounters:  09/28/22 160 lb (72.6 kg)  11/28/19 161 lb 13.1 oz (73.4 kg)  08/01/19 161 lb 9.6 oz (73.3 kg)   Patient is in no distress; well developed, nourished and groomed; neck is supple  CARDIOVASCULAR: Examination of carotid arteries is normal; no carotid bruits Regular rate and rhythm, no murmurs Examination of peripheral vascular system by observation and palpation  is normal  EYES: Ophthalmoscopic exam of optic discs and posterior segments is normal; no papilledema or hemorrhages No results found.  MUSCULOSKELETAL: Gait, strength, tone, movements noted in Neurologic exam below  NEUROLOGIC: MENTAL STATUS:      No data to display         awake, alert, oriented to person, place and time recent and remote memory intact normal attention and concentration language fluent; MILD WORD FINDING DIFF, comprehension intact, naming intact fund of knowledge appropriate  CRANIAL NERVE:  2nd - no papilledema on fundoscopic exam 2nd, 3rd, 4th, 6th - pupils equal and reactive to light, visual fields full to confrontation, extraocular muscles intact, no nystagmus 5th - facial sensation symmetric 7th - facial strength --> SLIGHT DECR RIGHT NL FOLD 8th - hearing intact 9th - palate elevates symmetrically, uvula midline 11th - shoulder shrug symmetric 12th - tongue protrusion midline  MOTOR:  normal bulk and tone, full strength in the BUE, BLE; NO DRIFT  SENSORY:  normal and symmetric to light touch, temperature, vibration  COORDINATION:  finger-nose-finger, fine finger movements normal; SLIGHTLY DECR ON RIGHT HAND WITH 4TH AND 5TH DIGITS  REFLEXES:  deep tendon reflexes 1+ and symmetric  GAIT/STATION:  narrow based gait     DIAGNOSTIC DATA (LABS, IMAGING, TESTING) - I reviewed patient records, labs, notes, testing and imaging myself where available.  Lab Results  Component Value Date   WBC 8.3  06/21/2017   HGB 13.9 06/21/2017   HCT 40.7 06/21/2017   MCV 87.0 06/21/2017   PLT 206 06/21/2017      Component Value Date/Time   NA 137 06/21/2017 0222   K 3.9 06/21/2017 0222   CL 106 06/21/2017 0222   CO2 22 06/21/2017 0222   GLUCOSE 115 (H) 06/21/2017 0222   BUN 14 06/21/2017 0222   CREATININE 0.98 06/21/2017 0222   CALCIUM 8.1 (L) 06/21/2017 0222   PROT 6.6 06/20/2017 1124   ALBUMIN 3.8 06/20/2017 1124   AST 36 06/20/2017 1124   ALT 21 06/20/2017 1124   ALKPHOS 66 06/20/2017 1124   BILITOT 1.1 06/20/2017 1124   GFRNONAA >60 06/21/2017 0222   GFRAA >60 06/21/2017 0222   Lab Results  Component Value Date   CHOL 220 (H) 06/20/2017   HDL 33 (L) 06/20/2017   LDLCALC 158 (H) 06/20/2017   TRIG 144 06/20/2017   CHOLHDL 6.7 06/20/2017   Lab Results  Component Value Date   HGBA1C 5.6 06/20/2017   No results found for: "VITAMINB12" No results found for: "TSH"   09/10/2022 MRI brain  -Evolving acute to subacute large left-sided MCA infarction with cytotoxic edema and petechial hemorrhagic transformation  09/08/2022 MRA head -No significant stenosis  09/06/2022 neurointerventional radiology procedure -Mid left MCA M1 segment embolic occlusion with poor collaterals  09/06/2022 CT angio chest -Small subsegmental pulmonary emboli in the right lower lobe  09/06/2022 ultrasound lower extremities -DVT noted in the right peroneal vein  09/11/2022 TEE -Ejection fraction 60 to 65% -Patent foramen ovale with left-to-right shunting -Compared to transthoracic echocardiogram small PFO noted      ASSESSMENT AND PLAN  57 y.o. year old male here with hypertension hyperlipidemia, with embolic stroke left middle cerebral artery in September 06, 2022, 5 days after plastic surgery procedures including liposuction facelift.  Found to have DVT, PFO, raising possibility of paradoxic embolism.  Dx:  1. Chronic ischemic left MCA stroke   2. Cerebrovascular accident (CVA) due to embolism  of left middle cerebral artery  PLAN:  LEFT MCA STROKE (embolic; ? Paradoxical embolus; DVT/PE)  - continue eliquis  - follow up with heme-onc for hypercoag workup (positive lupus anticoagulant on initial testing)  - consider implanted loop recorder (to look for paroxysmal atrial fibrillation)  - follow up with cardiology for PFO evaluation (if not other cause of stroke found, then would consider PFO closure; could consider TCD bubble study to better evaluate R --> L shunting  RoPE score = 6 points 62% chance that stroke is due to PFO. 8% risk of 2 year recurrence of stroke/TIA.  - hyperlipidemia --> statin vs PCSK9 inhibitor - hypertension --> losartan, bystolic   Return in about 4 months (around 01/28/2023).  I spent 60 minutes of face-to-face and non-face-to-face time with patient.  This included previsit chart review, lab review, study review, order entry, electronic health record documentation, patient education.     Suanne Marker, MD 10/02/2022, 4:50 PM Certified in Neurology, Neurophysiology and Neuroimaging  [facility] [address] [place], [place] [address] [phone]

## 2022-09-28 NOTE — Therapy (Signed)
OUTPATIENT OCCUPATIONAL THERAPY NEURO  Treatment Note  Patient Name: Cameron Hudson MRN: [id number] DOB:11-22-1965, 57 y.o., male Today's Date: 09/28/2022  PCP: Shon Baton, MD REFERRING PROVIDER: Shon Baton, MD  END OF SESSION:  OT End of Session - 09/28/22 1501     Visit Number 3    Number of Visits 17    Date for OT Re-Evaluation 11/10/22    Authorization Type [employer] / [employer] [id number]    Authorization - Number of Visits 20   OT/PT combined   OT Start Time 1500   pt arrival time   OT Stop Time 1535    OT Time Calculation (min) 35 min               Past Medical History:  Diagnosis Date   Allergy    Asthma    CAD S/P percutaneous coronary angioplasty 06/20/2017   Inferior STEMI - 100% dRCA --> DES PCI with Synergy 3.0 x 24 (3.3 mm). Mild 10-30% disease in p-mRCA, Cx & LAD.  EF Normal.    Hemorrhoids    Hx of adenomatous polyp of colon 08/01/2019   Hyperlipidemia    IBS (irritable bowel syndrome)    Personal history of colonic polyps 2004   12 mm adenoma   Seasonal allergies    STEMI involving oth coronary artery of inferior wall 06/20/2017   12/18 PCI/DESx1 to dRCA, normal EF   Past Surgical History:  Procedure Laterality Date   CHOLECYSTECTOMY N/A 11/28/2019   Procedure: LAPAROSCOPIC CHOLECYSTECTOMY;  Surgeon: Coralie Keens, MD;  Location: [facility];  Service: General;  Laterality: N/A;   COLONOSCOPY     COLONOSCOPY W/ POLYPECTOMY  08/27/2002; 08/30/2005; 01/06/2011   3mm adenoma, diverticulosis, hemorrhoids; normal;  diverticulosis and small hemorrhoids   CORONARY STENT INTERVENTION N/A 06/20/2017   Procedure: CORONARY STENT INTERVENTION;  Surgeon: Troy Sine, MD;  Location: [facility];  Service: Cardiovascular;  Laterality: N/A;   LEFT HEART CATH AND CORONARY ANGIOGRAPHY N/A 06/20/2017   Procedure: LEFT HEART CATH AND CORONARY ANGIOGRAPHY;  Surgeon: Troy Sine, MD;  Location: [facility];  Service: Cardiovascular;   Laterality: N/A;   NASAL SINUS SURGERY  Dec 2011   POLYPECTOMY     Patient Active Problem List   Diagnosis Date Noted   Hx of adenomatous polyp of colon 08/01/2019   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   IFG (impaired fasting glucose) 08/11/2017   Hypogonadism, male 08/11/2017   Statin intolerance 08/09/2017   Encounter for long-term (current) use of medications 08/09/2017   Hyperlipidemia with target low density lipoprotein (LDL) cholesterol less than 70 mg/dL 06/22/2017   STEMI involving oth coronary artery of inferior wall 06/20/2017   CAD S/P percutaneous coronary angioplasty 06/20/2017   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   Acute pain of right shoulder 10/07/2016   Radicular pain 08/09/2016   Seasonal allergies 07/31/2013   History of chronic sinusitis 05/11/2012   Allergy 05/11/2012   Asthma, exercise induced 10/09/2010   Chronic sinusitis 10/09/2010   Perennial allergic rhinitis with seasonal variation 10/09/2010    ONSET DATE: 09/06/22  REFERRING DIAG: I63.9 (ICD-10-CM) - CVA (cerebral vascular accident)  THERAPY DIAG:  Hemiplegia and hemiparesis following cerebral infarction affecting right non-dominant side  Other disturbances of skin sensation  Other lack of coordination  Attention and concentration deficit  Rationale for Evaluation and Treatment: Rehabilitation  SUBJECTIVE:   SUBJECTIVE STATEMENT: Pt reports that he just came from his neurologist appt. Pt accompanied by: self and significant  other (spouse [name])  PERTINENT HISTORY: Coronary artery disease, s/p inf STEMI and DES to occluded RCA in 05/2017  PRECAUTIONS: None  WEIGHT BEARING RESTRICTIONS: No  PAIN:  Are you having pain? No  FALLS: Has patient fallen in last 6 months? Yes. Number of falls 1 - collapsed with onset of CVA  LIVING ENVIRONMENT: Lives with: lives with their family (14 and 33 yo daughters) Lives in: House/apartment Stairs: Yes: Internal: able to remain on main level will  full bedroom/bathroom, does have 2nd floor - children's bedrooms are upstairs steps; on right going up and External: 4-5 steps; on right going up Has following equipment at home: None  PLOF: Independent and Independent with basic ADLs, sees pt's 2 days per week and admin 1.5 days  PATIENT GOALS: to make sure that I don't need OT  OBJECTIVE:   HAND DOMINANCE: Left  ADLs: Overall ADLs: Independent with bathing/dressing and bathroom transfers, no difficulty with clothing fasteners Transfers/ambulation related to ADLs: Independent Equipment: none  IADLs: Pt just returned home from hospital yesterday, has not yet attempted any IADLs.  Pt reports that he feels he could do them, but has not had the opportunity to attempt. Medication management: Spouse is currently assisting with dispensing medications  Financial management: Did go in to the clinic to do some payroll with supervision Handwriting: 100% legible  MOBILITY STATUS: Independent  POSTURE COMMENTS:  No Significant postural limitations  ACTIVITY TOLERANCE: Activity tolerance: WFL for tasks assessed on eval  FUNCTIONAL OUTCOME MEASURES: FOTO: 91, predicted 98 at d/c  UPPER EXTREMITY ROM:  WFL bilaterally   UPPER EXTREMITY MMT:   Grossly 5/5 bilaterally  HAND FUNCTION: Grip strength: Right: 90 lbs; Left: 100 lbs, Lateral pinch: Right: 23 lbs, Left: 21 lbs, and 3 point pinch: Right: 20 lbs, Left: 17 lbs  COORDINATION: Finger Nose Finger test: R mild decreased coordination and speed compared to L 9 Hole Peg test: Right: 28.78 sec; Left: 20.10 sec  SENSATION: Slight decreased sensation in R hand, along ulnar distribution  COGNITION: Overall cognitive status: Within functional limits for tasks assessed and requires slowing with verbal cues to allow for increased time to process. Pt with difficulty with multi-step directions  VISION: Subjective report: no issues Baseline vision: Wears glasses for reading only  PRAXIS:  Impaired: possible mild apraxia, however difficult to assess due to aphasia   TODAY'S TREATMENT:                                                              09/28/22 Hand gripper: with LUE on level 75# with black spring. Pt picked up 1 inch blocks with gripper with 20% drops and mod difficulty. Pt attempting to complete with 4th and 5th digits only, however resistance too strong but able to complete with full hand.  Pt expressing desire to focus on strengthening of ring and small fingers, therefore OT decreased hand gripper to 55# with black spring to complete with 3-5th digit.  Pt completing 10 with increased drops as he fatigued, after 20-30 rest pt completed remaining 10 with improved control.  Discussed importance of rest between sets, especially when carrying over to returning to working out.   Lacing activity: completed lacing activity with focus on [facility] and to simulate aspects of job.  Pt with no issues  when completing lacing activity. Theraputty: engaged in putty squeezes, thumb opposition, 3 jaw chuck, and "duck mouth" with red theraputty with focus on involving small finger into grasp.  Discussed functional tasks to incorporate full grasp as pt with continued reports of decreased strength and sensation in 4th and 5th digits as compared to other hand.  Pt demonstrating improved pinch with thumb opposition, slight decrease with repetition and fatigue.  OT providing cues and demonstration for "duck mouth" due to decreased incorporation of pinky.  OT instructed pt in placing marbles into putty and then removing to focus on pinch strength and sensation with relying on senses when locating marbles over vision.    09/27/22 Engaged in small peg board pattern replication with use of key to identify correlating colors, challenging alternating attention, visual perception, and R hand coordination and dexterity. Pt initially picking up pegs with L hand and utilizing R hand to place into pincer grasp to place  pegs into peg board.  OT encouraged pt to pick up one at a time with R hand to challenge motor control and coordination.  Pt placing pegs in color order, not in linear order but not making any errors despite need to visually scan increased times. Educated on functional carryover of task implications with systematic process to minimize onset of errors. Coordination: with RUE including: picking up various small objects/coins and trying to see how many pt is able to hold in-hand without drops, picking up coins and stacking them, and picking up 5-10 coins 1 at a time and translating palm to fingertips to place in a container or coin slot, rotating coins in finger tips, rotating pen between fingers.  Pt reports difficulty with coordination with smaller/flatter items and picking up with ring and small fingers.  OT educated on ways to increase/decrease challenge with size and width of items and utilizing LUE as needed to assess function when in doubt. Digiflex: finger and hand strengthening with medium resistance digiflex.  OT encouraged pt to complete strengthening with each finger in succession and then whole hand to challenge pinch grip/opposition and full grasp.  Pt demonstrated x5 with good strength, demonstrating decreased in 4th and 5th digits but overall understanding of activity. Peg board: engaged in small peg board pattern replication with increased focus on in-hand manipulation and translation while naming items in category.  Pt demonstrating drops 25% of time especially when incorporating increased cognitive dual tasking challenge.  Due to aphasia, pt with increased difficulty with naming items in category but able to write items.  Pt with improved coordination when dual tasking removed.    09/13/22 Engaged in education on impaired cognition and aphasia impacting dual tasking, following multi-step commands and it's impact on both work, IADLs, and leisure pursuits.  PATIENT EDUCATION: Education  details: Educated on role and purpose of OT as well as potential interventions and goals for therapy based on initial evaluation findings. Person educated: Patient and Spouse Education method: Explanation Education comprehension: verbalized understanding  HOME EXERCISE PROGRAM: Access Code: [email or web address] URL: [email or web address] Date: 09/28/2022 Prepared by: [facility] - Outpatient  Rehab - [facility]  Exercises - Putty Squeezes  - 1 x daily - 10 reps - Thumb Opposition with Putty  - 1 x daily - 10 reps - 3-Point Pinch with Putty  - 1 x daily - 10 reps - Finger Lumbricals with Putty  - 1 x daily - 10 reps - Marble Pick Up from Putty  - 1 x daily - 10 reps   GOALS:  Goals reviewed with patient? Yes  SHORT TERM GOALS: Target date: 10/13/22  Pt will be Independent with coordination HEP to increase independence with ADLs/IADLs Baseline: Goal status: IN PROGRESS  2.  Pt will complete table top scanning activity without cues, demonstrating improved sustained attention to task. Baseline:  Goal status: IN PROGRESS  3.  Pt will complete dual task activity in mild distracting environment with 90% accuracy. Baseline:  Goal status: IN PROGRESS   LONG TERM GOALS: Target date: 11/10/22  Pt will demonstrate improved fine motor coordination for ADLs as evidenced by decreasing 9 hole peg test score for RUE by 5 secs Baseline: Right: 28.78 sec; Left: 20.10 sec Goal status: IN PROGRESS  2.  Pt will be able to engage in dual task activity with 100% accuracy to demonstrate increase awareness, organization, and sequencing. Baseline:  Goal status: IN PROGRESS  3.  Pt will complete a moderately challenging bill paying/computer task <2 errors due to aphasia. Baseline:  Goal status: IN PROGRESS  4.  Pt will complete a 3 step task to carryover to IADL/work tasks with <1 cue for recall/sequencing. Baseline:  Goal status: IN PROGRESS   ASSESSMENT:  CLINICAL IMPRESSION: Pt  demonstrating increased drops with hand gripper due to decreased sustained grasp and mild impulsivity with tasks as pt frequently rushing through tasks.  Pt continues to report decreased sensation in 4th and 5th digits impacting motor control and strength, therefore focused on strengthening and education on functional tasks to incorporate entire hand.    PERFORMANCE DEFICITS: in functional skills including IADLs, coordination, sensation, Fine motor control, Gross motor control, endurance, and UE functional use, cognitive skills including attention, memory, orientation, problem solving, safety awareness, sequencing, thought, and understand, and psychosocial skills including environmental adaptation, habits, and routines and behaviors.   IMPAIRMENTS: are limiting patient from IADLs, work, leisure, and social participation.   CO-MORBIDITIES: may have co-morbidities  that affects occupational performance. Patient will benefit from skilled OT to address above impairments and improve overall function.  MODIFICATION OR ASSISTANCE TO COMPLETE EVALUATION: No modification of tasks or assist necessary to complete an evaluation.  OT OCCUPATIONAL PROFILE AND HISTORY: Problem focused assessment: Including review of records relating to presenting problem.  CLINICAL DECISION MAKING: LOW - limited treatment options, no task modification necessary  REHAB POTENTIAL: Good  EVALUATION COMPLEXITY: Low    PLAN:  OT FREQUENCY: 1-2x/week  OT DURATION: 8 weeks  PLANNED INTERVENTIONS: self care/ADL training, therapeutic exercise, therapeutic activity, neuromuscular re-education, functional mobility training, electrical stimulation, ultrasound, compression bandaging, moist heat, cryotherapy, patient/family education, cognitive remediation/compensation, psychosocial skills training, energy conservation, coping strategies training, and DME and/or AE instructions  RECOMMENDED OTHER SERVICES: NA  CONSULTED AND AGREED  WITH PLAN OF CARE: Patient and family member/caregiver  PLAN FOR NEXT SESSION: picking up items with tweezers/tongs, coordination, dual tasking and multi-step commands for sequencing   Aleia Larocca, Lind, OTR/L 09/28/2022, 3:45 PM

## 2022-10-02 ENCOUNTER — Ambulatory Visit: Payer: PRIVATE HEALTH INSURANCE | Admitting: Occupational Therapy

## 2022-10-02 DIAGNOSIS — I69353 Hemiplegia and hemiparesis following cerebral infarction affecting right non-dominant side: Secondary | ICD-10-CM | POA: Diagnosis not present

## 2022-10-02 DIAGNOSIS — I69318 Other symptoms and signs involving cognitive functions following cerebral infarction: Secondary | ICD-10-CM

## 2022-10-02 DIAGNOSIS — R41840 Attention and concentration deficit: Secondary | ICD-10-CM

## 2022-10-02 DIAGNOSIS — R208 Other disturbances of skin sensation: Secondary | ICD-10-CM

## 2022-10-02 DIAGNOSIS — R278 Other lack of coordination: Secondary | ICD-10-CM

## 2022-10-02 NOTE — Therapy (Signed)
OUTPATIENT OCCUPATIONAL THERAPY NEURO  Treatment Note  Patient Name: Cameron Hudson MRN: [id number] DOB:1965-10-02, 57 y.o., male Today's Date: 10/02/2022  PCP: Creola Cornusso, John, MD REFERRING PROVIDER: Creola Cornusso, John, MD  END OF SESSION:  OT End of Session - 10/02/22 1034     Visit Number 4    Number of Visits 17    Date for OT Re-Evaluation 11/10/22    Authorization Type [employer] / [employer] [id number]    Authorization - Number of Visits 20   OT/PT combined   OT Start Time 1017    OT Stop Time 1101    OT Time Calculation (min) 44 min                Past Medical History:  Diagnosis Date   Allergy    Asthma    CAD S/P percutaneous coronary angioplasty 06/20/2017   Inferior STEMI - 100% dRCA --> DES PCI with Synergy 3.0 x 24 (3.3 mm). Mild 10-30% disease in p-mRCA, Cx & LAD.  EF Normal.    Hemorrhoids    Hx of adenomatous polyp of colon 08/01/2019   Hyperlipidemia    IBS (irritable bowel syndrome)    Personal history of colonic polyps 2004   12 mm adenoma   Seasonal allergies    STEMI involving oth coronary artery of inferior wall 06/20/2017   12/18 PCI/DESx1 to dRCA, normal EF   Past Surgical History:  Procedure Laterality Date   CHOLECYSTECTOMY N/A 11/28/2019   Procedure: LAPAROSCOPIC CHOLECYSTECTOMY;  Surgeon: Abigail MiyamotoBlackman, Douglas, MD;  Location: [facility];  Service: General;  Laterality: N/A;   COLONOSCOPY     COLONOSCOPY W/ POLYPECTOMY  08/27/2002; 08/30/2005; 01/06/2011   12mm adenoma, diverticulosis, hemorrhoids; normal;  diverticulosis and small hemorrhoids   CORONARY STENT INTERVENTION N/A 06/20/2017   Procedure: CORONARY STENT INTERVENTION;  Surgeon: Lennette BihariKelly, Thomas A, MD;  Location: [facility];  Service: Cardiovascular;  Laterality: N/A;   LEFT HEART CATH AND CORONARY ANGIOGRAPHY N/A 06/20/2017   Procedure: LEFT HEART CATH AND CORONARY ANGIOGRAPHY;  Surgeon: Lennette BihariKelly, Thomas A, MD;  Location: [facility];  Service: Cardiovascular;  Laterality: N/A;    NASAL SINUS SURGERY  Dec 2011   POLYPECTOMY     Patient Active Problem List   Diagnosis Date Noted   Hx of adenomatous polyp of colon 08/01/2019   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   IFG (impaired fasting glucose) 08/11/2017   Hypogonadism, male 08/11/2017   Statin intolerance 08/09/2017   Encounter for long-term (current) use of medications 08/09/2017   Hyperlipidemia with target low density lipoprotein (LDL) cholesterol less than 70 mg/dL 36/64/403412/28/2018   STEMI involving oth coronary artery of inferior wall 06/20/2017   CAD S/P percutaneous coronary angioplasty 06/20/2017   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   Acute pain of right shoulder 10/07/2016   Radicular pain 08/09/2016   Seasonal allergies 07/31/2013   History of chronic sinusitis 05/11/2012   Allergy 05/11/2012   Asthma, exercise induced 10/09/2010   Chronic sinusitis 10/09/2010   Perennial allergic rhinitis with seasonal variation 10/09/2010    ONSET DATE: 09/06/22  REFERRING DIAG: I63.9 (ICD-10-CM) - CVA (cerebral vascular accident)  THERAPY DIAG:  Hemiplegia and hemiparesis following cerebral infarction affecting right non-dominant side  Other disturbances of skin sensation  Other lack of coordination  Attention and concentration deficit  Other symptoms and signs involving cognitive functions following cerebral infarction  Rationale for Evaluation and Treatment: Rehabilitation  SUBJECTIVE:   SUBJECTIVE STATEMENT: Pt reports that his hand still feels "different".  Pt accompanied by: self and significant other (spouse [name])  PERTINENT HISTORY: Coronary artery disease, s/p inf STEMI and DES to occluded RCA in 05/2017  PRECAUTIONS: None  WEIGHT BEARING RESTRICTIONS: No  PAIN:  Are you having pain? No  FALLS: Has patient fallen in last 6 months? Yes. Number of falls 1 - collapsed with onset of CVA  LIVING ENVIRONMENT: Lives with: lives with their family (71 and 59 yo daughters) Lives in:  House/apartment Stairs: Yes: Internal: able to remain on main level will full bedroom/bathroom, does have 2nd floor - children's bedrooms are upstairs steps; on right going up and External: 4-5 steps; on right going up Has following equipment at home: None  PLOF: Independent and Independent with basic ADLs, sees pt's 2 days per week and admin 1.5 days  PATIENT GOALS: to make sure that I don't need OT  OBJECTIVE:   HAND DOMINANCE: Left  ADLs: Overall ADLs: Independent with bathing/dressing and bathroom transfers, no difficulty with clothing fasteners Transfers/ambulation related to ADLs: Independent Equipment: none  IADLs: Pt just returned home from hospital yesterday, has not yet attempted any IADLs.  Pt reports that he feels he could do them, but has not had the opportunity to attempt. Medication management: Spouse is currently assisting with dispensing medications  Financial management: Did go in to the clinic to do some payroll with supervision Handwriting: 100% legible  MOBILITY STATUS: Independent  POSTURE COMMENTS:  No Significant postural limitations  ACTIVITY TOLERANCE: Activity tolerance: WFL for tasks assessed on eval  FUNCTIONAL OUTCOME MEASURES: FOTO: 91, predicted 98 at d/c  UPPER EXTREMITY ROM:  WFL bilaterally   UPPER EXTREMITY MMT:   Grossly 5/5 bilaterally  HAND FUNCTION: Grip strength: Right: 90 lbs; Left: 100 lbs, Lateral pinch: Right: 23 lbs, Left: 21 lbs, and 3 point pinch: Right: 20 lbs, Left: 17 lbs  COORDINATION: Finger Nose Finger test: R mild decreased coordination and speed compared to L 9 Hole Peg test: Right: 28.78 sec; Left: 20.10 sec  SENSATION: Slight decreased sensation in R hand, along ulnar distribution  COGNITION: Overall cognitive status: Within functional limits for tasks assessed and requires slowing with verbal cues to allow for increased time to process. Pt with difficulty with multi-step directions  VISION: Subjective  report: no issues Baseline vision: Wears glasses for reading only  PRAXIS: Impaired: possible mild apraxia, however difficult to assess due to aphasia   TODAY'S TREATMENT:                                                              10/02/22 Grooved Pegs: with RUE for increased coordination. Pt placed pegs with one at a time and in hand manipulation and removed with attempts at use of tweezers but transitioning to use of tongs.  Pt completed with min difficulty with in-hand manipulation and translation from palm to fingers tips, but with zero drops with either technique.  Pt attempted to utilize plastic tweezers, however too flimsy for pegs therefore transitioned to use of rubber tongs to focus on grip strength and coordination.  Discussed functional carryover to work tasks and home making tasks. Tweezers: Utilized Veterinary surgeon to pick up small letter tiles with R hand for motor control and coordination.  OT challenging pt to complete while naming letters and numbers.  Pt with  errors 25-50% of time, however with use of counting strategy pt with improved accuracy with naming numbers.  OT educated on motor task vs cognitive and motor dual tasking as pt with increased difficulty with completion with addition of cognitive challenge. Newman Pies toss : Newman Pies toss within R hand with focus on full hand use and coordination of 4th and 5th digits with grasp/release during tossing/catching.  OT educated on motor task and increased challenge with motor and motor dual task as well as cognitive and motor dual tasking.  OT educated on functional instances when pt may alternate between single focus and dual tasking and ability to adapt and alternate during routine, functional tasks.      09/28/22 Hand gripper: with LUE on level 75# with black spring. Pt picked up 1 inch blocks with gripper with 20% drops and mod difficulty. Pt attempting to complete with 4th and 5th digits only, however resistance too strong but able to  complete with full hand.  Pt expressing desire to focus on strengthening of ring and small fingers, therefore OT decreased hand gripper to 55# with black spring to complete with 3-5th digit.  Pt completing 10 with increased drops as he fatigued, after 20-30 rest pt completed remaining 10 with improved control.  Discussed importance of rest between sets, especially when carrying over to returning to working out.   Lacing activity: completed lacing activity with focus on [facility] and to simulate aspects of job.  Pt with no issues when completing lacing activity. Theraputty: engaged in putty squeezes, thumb opposition, 3 jaw chuck, and "duck mouth" with red theraputty with focus on involving small finger into grasp.  Discussed functional tasks to incorporate full grasp as pt with continued reports of decreased strength and sensation in 4th and 5th digits as compared to other hand.  Pt demonstrating improved pinch with thumb opposition, slight decrease with repetition and fatigue.  OT providing cues and demonstration for "duck mouth" due to decreased incorporation of pinky.  OT instructed pt in placing marbles into putty and then removing to focus on pinch strength and sensation with relying on senses when locating marbles over vision.    09/27/22 Engaged in small peg board pattern replication with use of key to identify correlating colors, challenging alternating attention, visual perception, and R hand coordination and dexterity. Pt initially picking up pegs with L hand and utilizing R hand to place into pincer grasp to place pegs into peg board.  OT encouraged pt to pick up one at a time with R hand to challenge motor control and coordination.  Pt placing pegs in color order, not in linear order but not making any errors despite need to visually scan increased times. Educated on functional carryover of task implications with systematic process to minimize onset of errors. Coordination: with RUE including: picking up  various small objects/coins and trying to see how many pt is able to hold in-hand without drops, picking up coins and stacking them, and picking up 5-10 coins 1 at a time and translating palm to fingertips to place in a container or coin slot, rotating coins in finger tips, rotating pen between fingers.  Pt reports difficulty with coordination with smaller/flatter items and picking up with ring and small fingers.  OT educated on ways to increase/decrease challenge with size and width of items and utilizing LUE as needed to assess function when in doubt. Digiflex: finger and hand strengthening with medium resistance digiflex.  OT encouraged pt to complete strengthening with each finger in  succession and then whole hand to challenge pinch grip/opposition and full grasp.  Pt demonstrated x5 with good strength, demonstrating decreased in 4th and 5th digits but overall understanding of activity. Peg board: engaged in small peg board pattern replication with increased focus on in-hand manipulation and translation while naming items in category.  Pt demonstrating drops 25% of time especially when incorporating increased cognitive dual tasking challenge.  Due to aphasia, pt with increased difficulty with naming items in category but able to write items.  Pt with improved coordination when dual tasking removed.   PATIENT EDUCATION: Education details: ongoing condition specific education Person educated: Patient and Spouse Education method: Explanation Education comprehension: verbalized understanding  HOME EXERCISE PROGRAM: Access Code: [email or web address] URL: [email or web address] Date: 09/28/2022 Prepared by: [facility] - Outpatient  Rehab - [facility]  Exercises - Putty Squeezes  - 1 x daily - 10 reps - Thumb Opposition with Putty  - 1 x daily - 10 reps - 3-Point Pinch with Putty  - 1 x daily - 10 reps - Finger Lumbricals with Putty  - 1 x daily - 10 reps - Marble Pick Up from Putty  - 1 x  daily - 10 reps   GOALS: Goals reviewed with patient? Yes  SHORT TERM GOALS: Target date: 10/13/22  Pt will be Independent with coordination HEP to increase independence with ADLs/IADLs Baseline: Goal status: IN PROGRESS  2.  Pt will complete table top scanning activity without cues, demonstrating improved sustained attention to task. Baseline:  Goal status: IN PROGRESS  3.  Pt will complete dual task activity in mild distracting environment with 90% accuracy. Baseline:  Goal status: IN PROGRESS   LONG TERM GOALS: Target date: 11/10/22  Pt will demonstrate improved fine motor coordination for ADLs as evidenced by decreasing 9 hole peg test score for RUE by 5 secs Baseline: Right: 28.78 sec; Left: 20.10 sec Goal status: IN PROGRESS  2.  Pt will be able to engage in dual task activity with 100% accuracy to demonstrate increase awareness, organization, and sequencing. Baseline:  Goal status: IN PROGRESS  3.  Pt will complete a moderately challenging bill paying/computer task <2 errors due to aphasia. Baseline:  Goal status: IN PROGRESS  4.  Pt will complete a 3 step task to carryover to IADL/work tasks with <1 cue for recall/sequencing. Baseline:  Goal status: IN PROGRESS   ASSESSMENT:  CLINICAL IMPRESSION: Pt demonstrating difficulty with naming numbers and letters during cognitive motor dual tasking with use of tweezers for coordination.  Pt requiring phonemic cue 50-75% of time when unable to correctly name letter, pt utilizing counting strategy for improved identification of numbers.  Pt continues to report decreased sensation in 4th and 5th digits impacting motor control and strength, therefore OT educated on maintaining sustained hold on item in palm with 4th and 5th digit when completing coordination tasks that does not directly require use of those digits as well as in-hand manipulation and translation challenge with stability of 4th and 5th digits when completing  translation.    PERFORMANCE DEFICITS: in functional skills including IADLs, coordination, sensation, Fine motor control, Gross motor control, endurance, and UE functional use, cognitive skills including attention, memory, orientation, problem solving, safety awareness, sequencing, thought, and understand, and psychosocial skills including environmental adaptation, habits, and routines and behaviors.   IMPAIRMENTS: are limiting patient from IADLs, work, leisure, and social participation.   CO-MORBIDITIES: may have co-morbidities  that affects occupational performance. Patient will benefit from skilled OT to address above  impairments and improve overall function.  MODIFICATION OR ASSISTANCE TO COMPLETE EVALUATION: No modification of tasks or assist necessary to complete an evaluation.  OT OCCUPATIONAL PROFILE AND HISTORY: Problem focused assessment: Including review of records relating to presenting problem.  CLINICAL DECISION MAKING: LOW - limited treatment options, no task modification necessary  REHAB POTENTIAL: Good  EVALUATION COMPLEXITY: Low    PLAN:  OT FREQUENCY: 1-2x/week  OT DURATION: 8 weeks  PLANNED INTERVENTIONS: self care/ADL training, therapeutic exercise, therapeutic activity, neuromuscular re-education, functional mobility training, electrical stimulation, ultrasound, compression bandaging, moist heat, cryotherapy, patient/family education, cognitive remediation/compensation, psychosocial skills training, energy conservation, coping strategies training, and DME and/or AE instructions  RECOMMENDED OTHER SERVICES: NA  CONSULTED AND AGREED WITH PLAN OF CARE: Patient and family member/caregiver  PLAN FOR NEXT SESSION: picking up items with tweezers/tongs, coordination, dual tasking and multi-step commands for sequencing   Denya Buckingham, OTR/L 10/02/2022, 12:59 PM

## 2022-10-03 NOTE — Progress Notes (Unsigned)
New Hematology/Oncology Consult   Requesting MD: Dr. Creola Corn  [phone]      Reason for Consult: Positive lupus anticoagulant, DVT, PE, CVA with PFO  HPI: Cameron Hudson is a 57 year old man with hypertension, hyperlipidemia, CAD status post 1 stent.  He underwent liposuction surgery with fat implantation at an outside facility on 09/02/2022.  He was admitted to [facility] on 09/06/2022 with altered mental status; on arrival to the emergency room he was noted to have right facial droop, right upper extremity/right lower extremity hemiparesis, dysarthria and expressive aphasia.  Brain CT showed evolving subacute left MCA infarct.  He was transferred to [facility].  He was found to have mid left MCA M1 segment embolic occlusion with poor collaterals to the left MCA territory.  He underwent a thrombectomy procedure 09/06/2022.  He was found to have a PFO with left-to-right shunt with paradoxical embolus suspected as stroke mechanism.    He was found to have right peroneal DVT on bilateral venous Doppler study 09/06/2022 and small subsegmental pulmonary emboli in the right lower lobe superior segment and posterior basilar segment with right heart strain noted on CTA chest 09/06/2022.  No right heart strain was noted on TTE 09/06/2022.  He was started on a heparin drip and then transitioned to Eliquis 5 mg twice daily on 09/09/2022.  09/07/2022-DRVVT positive; functional protein C normal at 121%, protein S free activity normal at 80%; Antithrombin 3 normal at 87%; anticardiolipin antibody level negative.     Past Medical History:  Diagnosis Date   Allergy    Asthma    CAD S/P percutaneous coronary angioplasty 06/20/2017   Inferior STEMI - 100% dRCA --> DES PCI with Synergy 3.0 x 24 (3.3 mm). Mild 10-30% disease in p-mRCA, Cx & LAD.  EF Normal.    Hemorrhoids    Hx of adenomatous polyp of colon 08/01/2019   Hyperlipidemia    IBS (irritable bowel  syndrome)    Personal history of colonic polyps 2004   12 mm adenoma   Seasonal allergies    STEMI involving oth coronary artery of inferior wall 06/20/2017   12/18 PCI/DESx1 to dRCA, normal EF  :   Past Surgical History:  Procedure Laterality Date   CHOLECYSTECTOMY N/A 11/28/2019   Procedure: LAPAROSCOPIC CHOLECYSTECTOMY;  Surgeon: Abigail Miyamoto, MD;  Location: [facility];  Service: General;  Laterality: N/A;   COLONOSCOPY     COLONOSCOPY W/ POLYPECTOMY  08/27/2002; 08/30/2005; 01/06/2011   50mm adenoma, diverticulosis, hemorrhoids; normal;  diverticulosis and small hemorrhoids   CORONARY STENT INTERVENTION N/A 06/20/2017   Procedure: CORONARY STENT INTERVENTION;  Surgeon: Lennette Bihari, MD;  Location: [facility];  Service: Cardiovascular;  Laterality: N/A;   LEFT HEART CATH AND CORONARY ANGIOGRAPHY N/A 06/20/2017   Procedure: LEFT HEART CATH AND CORONARY ANGIOGRAPHY;  Surgeon: Lennette Bihari, MD;  Location: [facility];  Service: Cardiovascular;  Laterality: N/A;   NASAL SINUS SURGERY  Dec 2011   POLYPECTOMY    :   Current Outpatient Medications:    apixaban (ELIQUIS) 5 MG TABS tablet, Take 5 mg by mouth 2 (two) times daily., Disp: , Rfl:    ezetimibe (ZETIA) 10 MG tablet, Take 1 tablet by mouth once daily, Disp: 90 tablet, Rfl: 0   losartan (COZAAR) 50 MG tablet, Take 50 mg by mouth daily., Disp: , Rfl:    nebivolol (BYSTOLIC) 10 MG tablet, Take 10 mg by mouth daily., Disp: , Rfl: :  :  Allergies  Allergen Reactions   Advair Diskus [Fluticasone-Salmeterol] Other (See Comments)    Increased BP   Cheese Other (See Comments)    GI upset    Statins Other (See Comments)    Muscle pain   Adhesive [Tape] Rash    After a few days   Sulfa Antibiotics Rash  :  FH:  SOCIAL HISTORY:  Review of Systems:  Physical Exam:  There were no vitals taken for this visit.  HEENT: *** Lungs: *** Cardiac: *** Abdomen: *** GU: ***  Vascular:  *** Lymph nodes: *** Neurologic: *** Skin: *** Musculoskeletal: ***  LABS:  No results for input(s): "WBC", "HGB", "HCT", "PLT" in the last 72 hours.  No results for input(s): "NA", "K", "CL", "CO2", "GLUCOSE", "BUN", "CREATININE", "CALCIUM" in the last 72 hours.    RADIOLOGY:  No results found.  Assessment and Plan:   ***    Lonna Cobb, NP 10/03/2022, 1:40 PM

## 2022-10-04 ENCOUNTER — Inpatient Hospital Stay: Payer: PRIVATE HEALTH INSURANCE | Attending: Nurse Practitioner | Admitting: Nurse Practitioner

## 2022-10-04 ENCOUNTER — Encounter: Payer: Self-pay | Admitting: Nurse Practitioner

## 2022-10-04 ENCOUNTER — Ambulatory Visit: Payer: PRIVATE HEALTH INSURANCE | Admitting: Occupational Therapy

## 2022-10-04 ENCOUNTER — Inpatient Hospital Stay: Payer: PRIVATE HEALTH INSURANCE

## 2022-10-04 ENCOUNTER — Ambulatory Visit: Payer: PRIVATE HEALTH INSURANCE

## 2022-10-04 VITALS — BP 124/68 | HR 60 | Temp 98.1°F | Resp 18 | Ht 69.0 in | Wt 161.6 lb

## 2022-10-04 DIAGNOSIS — I2699 Other pulmonary embolism without acute cor pulmonale: Secondary | ICD-10-CM | POA: Diagnosis not present

## 2022-10-04 DIAGNOSIS — Q2112 Patent foramen ovale: Secondary | ICD-10-CM | POA: Diagnosis not present

## 2022-10-04 DIAGNOSIS — R278 Other lack of coordination: Secondary | ICD-10-CM

## 2022-10-04 DIAGNOSIS — I82451 Acute embolism and thrombosis of right peroneal vein: Secondary | ICD-10-CM

## 2022-10-04 DIAGNOSIS — I2510 Atherosclerotic heart disease of native coronary artery without angina pectoris: Secondary | ICD-10-CM | POA: Insufficient documentation

## 2022-10-04 DIAGNOSIS — Z7901 Long term (current) use of anticoagulants: Secondary | ICD-10-CM | POA: Diagnosis not present

## 2022-10-04 DIAGNOSIS — Z86711 Personal history of pulmonary embolism: Secondary | ICD-10-CM | POA: Insufficient documentation

## 2022-10-04 DIAGNOSIS — R41840 Attention and concentration deficit: Secondary | ICD-10-CM

## 2022-10-04 DIAGNOSIS — R531 Weakness: Secondary | ICD-10-CM | POA: Diagnosis not present

## 2022-10-04 DIAGNOSIS — R208 Other disturbances of skin sensation: Secondary | ICD-10-CM

## 2022-10-04 DIAGNOSIS — I252 Old myocardial infarction: Secondary | ICD-10-CM | POA: Insufficient documentation

## 2022-10-04 DIAGNOSIS — I10 Essential (primary) hypertension: Secondary | ICD-10-CM | POA: Insufficient documentation

## 2022-10-04 DIAGNOSIS — Z79899 Other long term (current) drug therapy: Secondary | ICD-10-CM | POA: Insufficient documentation

## 2022-10-04 DIAGNOSIS — J45909 Unspecified asthma, uncomplicated: Secondary | ICD-10-CM | POA: Insufficient documentation

## 2022-10-04 DIAGNOSIS — E785 Hyperlipidemia, unspecified: Secondary | ICD-10-CM | POA: Insufficient documentation

## 2022-10-04 DIAGNOSIS — Z8719 Personal history of other diseases of the digestive system: Secondary | ICD-10-CM | POA: Diagnosis not present

## 2022-10-04 DIAGNOSIS — D6862 Lupus anticoagulant syndrome: Secondary | ICD-10-CM | POA: Insufficient documentation

## 2022-10-04 DIAGNOSIS — K589 Irritable bowel syndrome without diarrhea: Secondary | ICD-10-CM | POA: Diagnosis not present

## 2022-10-04 DIAGNOSIS — Z86010 Personal history of colonic polyps: Secondary | ICD-10-CM | POA: Insufficient documentation

## 2022-10-04 DIAGNOSIS — I69353 Hemiplegia and hemiparesis following cerebral infarction affecting right non-dominant side: Secondary | ICD-10-CM | POA: Diagnosis not present

## 2022-10-04 DIAGNOSIS — Z86718 Personal history of other venous thrombosis and embolism: Secondary | ICD-10-CM | POA: Insufficient documentation

## 2022-10-04 DIAGNOSIS — R4701 Aphasia: Secondary | ICD-10-CM

## 2022-10-04 LAB — CBC WITH DIFFERENTIAL (CANCER CENTER ONLY)
Abs Immature Granulocytes: 0.01 10*3/uL (ref 0.00–0.07)
Basophils Absolute: 0 10*3/uL (ref 0.0–0.1)
Basophils Relative: 0 %
Eosinophils Absolute: 0.3 10*3/uL (ref 0.0–0.5)
Eosinophils Relative: 5 %
HCT: 41.7 % (ref 39.0–52.0)
Hemoglobin: 13.5 g/dL (ref 13.0–17.0)
Immature Granulocytes: 0 %
Lymphocytes Relative: 34 %
Lymphs Abs: 1.8 10*3/uL (ref 0.7–4.0)
MCH: 28.4 pg (ref 26.0–34.0)
MCHC: 32.4 g/dL (ref 30.0–36.0)
MCV: 87.8 fL (ref 80.0–100.0)
Monocytes Absolute: 0.4 10*3/uL (ref 0.1–1.0)
Monocytes Relative: 8 %
Neutro Abs: 2.7 10*3/uL (ref 1.7–7.7)
Neutrophils Relative %: 53 %
Platelet Count: 187 10*3/uL (ref 150–400)
RBC: 4.75 MIL/uL (ref 4.22–5.81)
RDW: 14.6 % (ref 11.5–15.5)
WBC Count: 5.1 10*3/uL (ref 4.0–10.5)
nRBC: 0 % (ref 0.0–0.2)

## 2022-10-04 LAB — ANTITHROMBIN III: AntiThromb III Func: 102 % (ref 75–120)

## 2022-10-04 NOTE — Therapy (Signed)
OUTPATIENT OCCUPATIONAL THERAPY NEURO  Treatment Note  Patient Name: Cameron Hudson MRN: [id number] DOB:Oct 26, 1965, 57 y.o., male Today's Date: 10/04/2022  PCP: Creola Corn, MD REFERRING PROVIDER: Creola Corn, MD  END OF SESSION:  OT End of Session - 10/04/22 1119     Visit Number 5    Number of Visits 17    Date for OT Re-Evaluation 11/10/22    Authorization Type [employer] / [employer] [id number]    Authorization - Number of Visits 20   OT/PT combined   OT Start Time 1021    OT Stop Time 1101    OT Time Calculation (min) 40 min                 Past Medical History:  Diagnosis Date   Allergy    Asthma    CAD S/P percutaneous coronary angioplasty 06/20/2017   Inferior STEMI - 100% dRCA --> DES PCI with Synergy 3.0 x 24 (3.3 mm). Mild 10-30% disease in p-mRCA, Cx & LAD.  EF Normal.    Hemorrhoids    Hx of adenomatous polyp of colon 08/01/2019   Hyperlipidemia    IBS (irritable bowel syndrome)    Personal history of colonic polyps 2004   12 mm adenoma   Seasonal allergies    STEMI involving oth coronary artery of inferior wall 06/20/2017   12/18 PCI/DESx1 to dRCA, normal EF   Past Surgical History:  Procedure Laterality Date   CHOLECYSTECTOMY N/A 11/28/2019   Procedure: LAPAROSCOPIC CHOLECYSTECTOMY;  Surgeon: Abigail Miyamoto, MD;  Location: [facility];  Service: General;  Laterality: N/A;   COLONOSCOPY     COLONOSCOPY W/ POLYPECTOMY  08/27/2002; 08/30/2005; 01/06/2011   19mm adenoma, diverticulosis, hemorrhoids; normal;  diverticulosis and small hemorrhoids   CORONARY STENT INTERVENTION N/A 06/20/2017   Procedure: CORONARY STENT INTERVENTION;  Surgeon: Lennette Bihari, MD;  Location: [facility];  Service: Cardiovascular;  Laterality: N/A;   LEFT HEART CATH AND CORONARY ANGIOGRAPHY N/A 06/20/2017   Procedure: LEFT HEART CATH AND CORONARY ANGIOGRAPHY;  Surgeon: Lennette Bihari, MD;  Location: [facility];  Service: Cardiovascular;  Laterality:  N/A;   NASAL SINUS SURGERY  Dec 2011   POLYPECTOMY     Patient Active Problem List   Diagnosis Date Noted   Hx of adenomatous polyp of colon 08/01/2019   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   IFG (impaired fasting glucose) 08/11/2017   Hypogonadism, male 08/11/2017   Statin intolerance 08/09/2017   Encounter for long-term (current) use of medications 08/09/2017   Hyperlipidemia with target low density lipoprotein (LDL) cholesterol less than 70 mg/dL 59/93/5701   STEMI involving oth coronary artery of inferior wall 06/20/2017   CAD S/P percutaneous coronary angioplasty 06/20/2017   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   Acute pain of right shoulder 10/07/2016   Radicular pain 08/09/2016   Seasonal allergies 07/31/2013   History of chronic sinusitis 05/11/2012   Allergy 05/11/2012   Asthma, exercise induced 10/09/2010   Chronic sinusitis 10/09/2010   Perennial allergic rhinitis with seasonal variation 10/09/2010    ONSET DATE: 09/06/22  REFERRING DIAG: I63.9 (ICD-10-CM) - CVA (cerebral vascular accident)  THERAPY DIAG:  Hemiplegia and hemiparesis following cerebral infarction affecting right non-dominant side  Other disturbances of skin sensation  Other lack of coordination  Attention and concentration deficit  Rationale for Evaluation and Treatment: Rehabilitation  SUBJECTIVE:   SUBJECTIVE STATEMENT: Pt reports that he has another appt after therapy. Pt accompanied by: self and significant other (spouse [name])  PERTINENT HISTORY: Coronary artery disease, s/p inf STEMI and DES to occluded RCA in 05/2017  PRECAUTIONS: None  WEIGHT BEARING RESTRICTIONS: No  PAIN:  Are you having pain? No  FALLS: Has patient fallen in last 6 months? Yes. Number of falls 1 - collapsed with onset of CVA  LIVING ENVIRONMENT: Lives with: lives with their family (47 and 55 yo daughters) Lives in: House/apartment Stairs: Yes: Internal: able to remain on main level will full  bedroom/bathroom, does have 2nd floor - children's bedrooms are upstairs steps; on right going up and External: 4-5 steps; on right going up Has following equipment at home: None  PLOF: Independent and Independent with basic ADLs, sees pt's 2 days per week and admin 1.5 days  PATIENT GOALS: to make sure that I don't need OT  OBJECTIVE:   HAND DOMINANCE: Left  ADLs: Overall ADLs: Independent with bathing/dressing and bathroom transfers, no difficulty with clothing fasteners Transfers/ambulation related to ADLs: Independent Equipment: none  IADLs: Pt just returned home from hospital yesterday, has not yet attempted any IADLs.  Pt reports that he feels he could do them, but has not had the opportunity to attempt. Medication management: Spouse is currently assisting with dispensing medications  Financial management: Did go in to the clinic to do some payroll with supervision Handwriting: 100% legible  MOBILITY STATUS: Independent  POSTURE COMMENTS:  No Significant postural limitations  ACTIVITY TOLERANCE: Activity tolerance: WFL for tasks assessed on eval  FUNCTIONAL OUTCOME MEASURES: FOTO: 91, predicted 98 at d/c  UPPER EXTREMITY ROM:  WFL bilaterally   UPPER EXTREMITY MMT:   Grossly 5/5 bilaterally  HAND FUNCTION: Grip strength: Right: 90 lbs; Left: 100 lbs, Lateral pinch: Right: 23 lbs, Left: 21 lbs, and 3 point pinch: Right: 20 lbs, Left: 17 lbs  COORDINATION: Finger Nose Finger test: R mild decreased coordination and speed compared to L 9 Hole Peg test: Right: 28.78 sec; Left: 20.10 sec  SENSATION: Slight decreased sensation in R hand, along ulnar distribution  COGNITION: Overall cognitive status: Within functional limits for tasks assessed and requires slowing with verbal cues to allow for increased time to process. 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  Pt utilizing dash marks to keep up with place during alternating attention.  Pt demonstrating increased difficulty with attempts at naming words, requiring phonemic cues especially with grey and green.  Discussed phonics component.  Pt demonstrating good sequencing with alternating attention. 9 hole peg test: L: 28.91 sec and 22.44 sec on second attempt.  Pt reports difficulty getting started sometimes.  Discussed "warm up" exercises with finger flicks and thumb opposition prior to engaging in [facility] as needed.  Pt demonstrating each. Gross grasp: OT demonstrating ball toss with BUE at wall (suggesting trial of rebounder) with focus on gross grasp and control.  Suggested attempting slightly deflated ball to allow for utilization  of all fingers with grasp when catching ball. In-hand manipulation and translation: picking up marbles one at a time and translating finger tips to palm to challenge amount sustained in grasp then translating palm to fingers tip to return to container.  Pt demonstrating improved motor control and sustained grip with 4th and 5th digits to maintain increased amount of marbles in hand.    10/02/22 Grooved Pegs: with RUE for increased coordination. Pt placed pegs with one at a time and in hand manipulation and removed with attempts at use of tweezers but transitioning to use of tongs.  Pt completed with min difficulty with in-hand manipulation and translation from palm to fingers tips, but with zero drops with either technique.  Pt attempted to utilize plastic tweezers, however too flimsy for pegs therefore transitioned to use of rubber tongs to focus on grip strength and coordination.  Discussed functional  carryover to work tasks and home making tasks. Tweezers: Utilized Veterinary surgeon to pick up small letter tiles with R hand for motor control and coordination.  OT challenging pt to complete while naming letters and numbers.  Pt with errors 25-50% of time, however with use of counting strategy pt with improved accuracy with naming numbers.  OT educated on motor task vs cognitive and motor dual tasking as pt with increased difficulty with completion with addition of cognitive challenge. Newman Pies toss : Newman Pies toss within R hand with focus on full hand use and coordination of 4th and 5th digits with grasp/release during tossing/catching.  OT educated on motor task and increased challenge with motor and motor dual task as well as cognitive and motor dual tasking.  OT educated on functional instances when pt may alternate between single focus and dual tasking and ability to adapt and alternate during routine, functional tasks.      09/28/22 Hand gripper: with LUE on level 75# with black spring. Pt picked up 1 inch blocks with gripper with 20% drops and mod difficulty. Pt attempting to complete with 4th and 5th digits only, however resistance too strong but able to complete with full hand.  Pt expressing desire to focus on strengthening of ring and small fingers, therefore OT decreased hand gripper to 55# with black spring to complete with 3-5th digit.  Pt completing 10 with increased drops as he fatigued, after 20-30 rest pt completed remaining 10 with improved control.  Discussed importance of rest between sets, especially when carrying over to returning to working out.   Lacing activity: completed lacing activity with focus on [facility] and to simulate aspects of job.  Pt with no issues when completing lacing activity. Theraputty: engaged in putty squeezes, thumb opposition, 3 jaw chuck, and "duck mouth" with red theraputty with focus on involving small finger into grasp.  Discussed functional tasks to incorporate full  grasp as pt with continued reports of decreased strength and sensation in 4th and 5th digits as compared to other hand.  Pt demonstrating improved pinch with thumb opposition, slight decrease with repetition and fatigue.  OT providing cues and demonstration for "duck mouth" due to decreased incorporation of pinky.  OT instructed pt in placing marbles into putty and then removing to focus on pinch strength and sensation with relying on senses when locating marbles over vision.     PATIENT EDUCATION: Education details: ongoing condition specific education Person educated: Patient and Spouse Education method: Explanation Education comprehension: verbalized understanding  HOME EXERCISE PROGRAM: Access Code: [email or web address] URL: [email or web address] Date: 09/28/2022 Prepared by: [facility] - Outpatient  Rehab - [facility]  Exercises - Putty Squeezes  - 1 x daily - 10 reps - Thumb Opposition with Putty  - 1 x daily - 10 reps - 3-Point Pinch with Putty  - 1 x daily - 10 reps - Finger Lumbricals with Putty  - 1 x daily - 10 reps - Marble Pick Up from Putty  - 1 x daily - 10 reps   GOALS: Goals reviewed with patient? Yes  SHORT TERM GOALS: Target date: 10/13/22  Pt will be Independent with coordination HEP to increase independence with ADLs/IADLs Baseline: Goal status: IN PROGRESS  2.  Pt will complete table top scanning activity without cues, demonstrating improved sustained attention to task. Baseline:  Goal status: IN PROGRESS  3.  Pt will complete dual task activity in mild distracting environment with 90% accuracy. Baseline:  Goal status: IN PROGRESS   LONG TERM GOALS: Target date: 11/10/22  Pt will demonstrate improved fine motor coordination for ADLs as evidenced by decreasing 9 hole peg test score for RUE by 5 secs Baseline: Right: 28.78 sec; Left: 20.10 sec Goal status: IN PROGRESS  2.  Pt will be able to engage in dual task activity with 100% accuracy to  demonstrate increase awareness, organization, and sequencing. Baseline:  Goal status: IN PROGRESS  3.  Pt will complete a moderately challenging bill paying/computer task <2 errors due to aphasia. Baseline:  Goal status: IN PROGRESS  4.  Pt will complete a 3 step task to carryover to IADL/work tasks with <1 cue for recall/sequencing. Baseline:  Goal status: IN PROGRESS   ASSESSMENT:  CLINICAL IMPRESSION: Pt demonstrating difficulty with naming colors during alternating attention table top/pen and paper task.  Pt requiring phonemic cue ~50% of time when unable to correctly name color.  Engaged in discussion about phonics and letter combinations with increased challenging words.  Reiterated gross and fine motor tasks to attempt to continue to focus on coordination and sequencing at home.  Pt reports that he may get some supplies from work to allow focus on [facility] and pinch strength.  PERFORMANCE DEFICITS: in functional skills including IADLs, coordination, sensation, Fine motor control, Gross motor control, endurance, and UE functional use, cognitive skills including attention, memory, orientation, problem solving, safety awareness, sequencing, thought, and understand, and psychosocial skills including environmental adaptation, habits, and routines and behaviors.   IMPAIRMENTS: are limiting patient from IADLs, work, leisure, and social participation.   CO-MORBIDITIES: may have co-morbidities  that affects occupational performance. Patient will benefit from skilled OT to address above impairments and improve overall function.  MODIFICATION OR ASSISTANCE TO COMPLETE EVALUATION: No modification of tasks or assist necessary to complete an evaluation.  OT OCCUPATIONAL PROFILE AND HISTORY: Problem focused assessment: Including review of records relating to presenting problem.  CLINICAL DECISION MAKING: LOW - limited treatment options, no task modification necessary  REHAB POTENTIAL:  Good  EVALUATION COMPLEXITY: Low    PLAN:  OT FREQUENCY: 1-2x/week  OT DURATION: 8 weeks  PLANNED INTERVENTIONS: self care/ADL training, therapeutic exercise, therapeutic activity, neuromuscular re-education, functional mobility training, electrical stimulation, ultrasound, compression bandaging, moist heat, cryotherapy, patient/family education, cognitive remediation/compensation, psychosocial skills training, energy conservation, coping strategies training, and DME and/or AE instructions  RECOMMENDED OTHER SERVICES: NA  CONSULTED AND AGREED WITH PLAN OF CARE: Patient and family member/caregiver  PLAN FOR NEXT SESSION: picking up items with tweezers/tongs, coordination, dual tasking and multi-step commands for sequencing   Elgin Carn, OTR/L 10/04/2022, 11:20 AM

## 2022-10-04 NOTE — Therapy (Signed)
OUTPATIENT SPEECH LANGUAGE PATHOLOGY APHASIA TREATMENT   Patient Name: Cameron Hudson MRN: [id number] DOB:1966/01/03, 57 y.o., male Today's Date: 10/04/2022  PCP: Creola Cornusso, John, MD REFERRING PROVIDER: [facility] Timothy Lasso([facility])  END OF SESSION:  End of Session - 10/04/22 0947     Visit Number 7    Number of Visits 17    Date for SLP Re-Evaluation 11/22/22    Authorization Time Period ST= 20 visits (per case mgr)    Authorization - Visit Number 7    Authorization - Number of Visits 20    SLP Start Time [phone]    SLP Stop Time  1015    SLP Time Calculation (min) 39 min    Activity Tolerance Patient tolerated treatment well                Past Medical History:  Diagnosis Date   Allergy    Asthma    CAD S/P percutaneous coronary angioplasty 06/20/2017   Inferior STEMI - 100% dRCA --> DES PCI with Synergy 3.0 x 24 (3.3 mm). Mild 10-30% disease in p-mRCA, Cx & LAD.  EF Normal.    Hemorrhoids    Hx of adenomatous polyp of colon 08/01/2019   Hyperlipidemia    IBS (irritable bowel syndrome)    Personal history of colonic polyps 2004   12 mm adenoma   Seasonal allergies    STEMI involving oth coronary artery of inferior wall 06/20/2017   12/18 PCI/DESx1 to dRCA, normal EF   Past Surgical History:  Procedure Laterality Date   CHOLECYSTECTOMY N/A 11/28/2019   Procedure: LAPAROSCOPIC CHOLECYSTECTOMY;  Surgeon: Abigail MiyamotoBlackman, Douglas, MD;  Location: [facility];  Service: General;  Laterality: N/A;   COLONOSCOPY     COLONOSCOPY W/ POLYPECTOMY  08/27/2002; 08/30/2005; 01/06/2011   12mm adenoma, diverticulosis, hemorrhoids; normal;  diverticulosis and small hemorrhoids   CORONARY STENT INTERVENTION N/A 06/20/2017   Procedure: CORONARY STENT INTERVENTION;  Surgeon: Lennette BihariKelly, Thomas A, MD;  Location: [facility];  Service: Cardiovascular;  Laterality: N/A;   LEFT HEART CATH AND CORONARY ANGIOGRAPHY N/A 06/20/2017   Procedure: LEFT HEART CATH AND CORONARY ANGIOGRAPHY;   Surgeon: Lennette BihariKelly, Thomas A, MD;  Location: [facility];  Service: Cardiovascular;  Laterality: N/A;   NASAL SINUS SURGERY  Dec 2011   POLYPECTOMY     Patient Active Problem List   Diagnosis Date Noted   Hx of adenomatous polyp of colon 08/01/2019   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   IFG (impaired fasting glucose) 08/11/2017   Hypogonadism, male 08/11/2017   Statin intolerance 08/09/2017   Encounter for long-term (current) use of medications 08/09/2017   Hyperlipidemia with target low density lipoprotein (LDL) cholesterol less than 70 mg/dL 11/91/478212/28/2018   STEMI involving oth coronary artery of inferior wall 06/20/2017   CAD S/P percutaneous coronary angioplasty 06/20/2017   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 The - - korkle. No." (Pt wrote "joke") Pt accompanied by: significant other wife-Jill  PERTINENT HISTORY: HLD, CAD,S/P one stent  PAIN: Are you having pain? No  FALLS: Has patient fallen in last 6 months?  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   PATIENT REPORTED OUTCOME MEASURES (PROM): Communication Effectiveness Survey: (CES) :Pt filled out CES  and scored himself 13/32 (higher numbers indicate better QOL/effective communication). "1" were scored for participating in conversation with strangers in a quiet place, conversing with a stranger over the  telephone, conversing in a noisy environment, and having a conversation with someone at a distance. [name] scored pt 10/32, with "1" for participating in a conversation with strangers in a quiet place, with a familiar person over the telephone, with a stranger over the telephone, in a noisy environment, with a friend when emotionally upset or angry, and having a conversation with someone at a distance.. Pt's higher score likely indicates his awareness of errors is not accurate due to his aphasia.   TODAY'S TREATMENT:                                                                                                                                           10/04/22: Pt functionally told SLP about the weekend with rare min questioning cues from SLP, and second attempts by pt. SLP targeted functional therapy in a conversational manner - this was fostered by pt writing words he could not articulate correctly and SLP practicing those words with pt. SLP cont to model this type of practice for [name] - as pt's schedule has ~9 hours of drive time/week that he practices with [facility]. SLP cont to encourage conversational practice at times other than car time. SLP reiterated strongly that pt must practice the target words afterwards for maximum benefit for practice. Pt wife asks SLP about strategies for pronoun expression. SLP encouraged pt to slow his rate of speech and told wife/pt that pronouns may well be one thing that pt will need to pay special attention to from this point onward with his spontaneous speech.  09/28/22: Pt used written augmentation extensively today in 15-minutes simple to mod complex conversation with SLP and was nearly functional with SLP semantic and articulatory cues. SLP educated wife how to use semantic cues with pt today via model and direct instruction. Pt cont to work with constant therapy at home 40-70 minutes/day.  09/27/22:SLP provided CES today for both pt and [name]; Results above. SLP  targeted functional language with salient and high interest topics in simple conversation. Pt was nearly functional but req'd SLP questioning cues rarely. Pt augmented verbal communication with written communication on a pad successfully 90% of the time. Pt's spontaneous speech is more grammatically correct and more prevalent today than in previous sessions.   SLP then linked clinician CT to pt's CT profile.   09/20/22: SLP targeted expressive and receptive language. SLP modeled practice for  home tasks and provided wife with suggested cues for target word. Pt benefited today from semantic cues. Pt filled in 2 letters for body parts with 4/6 success and incr'd to 6/6 with min A. Written cues from pt did not appear to assist verbal expression. Neologism and semantic errors prevalent, with incr'd awareness than Monday.  09/18/22:QAB completed today. Score above. Pt with less frustration today - self-calming behavior, "It's ok it's ok" x4. SLP provided education on [facility] telling pt/wife that pt could work on it if frustration level does not inhibit ability to complete. Homework to say family names, other automatic speech, and cloze phrases/sentences.  SLP provided clinic number for [facility] and suggested wife call to obtain information about possible summer sessions.  09/14/22: Expressed frustration re: hwk for sentence completions. Demo'd good comprehension of task, correct completion of sentences, and accurate writing; however, usual verbal reading errors noted, particularly content words. Reduced awareness of errors demonstrated initially, which improved throughout session. SLP educated rationale of task, including task hierarchy (starting with easier, more automatic tasks and then advancing) and targeted deficits (comprehension and expression). Writing noted as relative strength with pt often writing key words to optimize listener comprehension given usual neologisms and paraphasias. SLP targeted verbal  expression of personally relevant words from his personal notebook. SLP demonstrated recommended cueing hierarchy to maximize patient performance. Pt benefited from usual verbal/visual modeling fading to visual modeling (mouthing sounds) to saying word in response to a question to saying word independently with intermittent choral practice needed. See patient instructions for additional education and HEP. Briefly discussed auditory comprehension strategies (slower rate, repetition, rephrasing, confirmation); additional education warranted.   PATIENT EDUCATION: Education details: see above Person educated: Patient and Spouse Education method: Explanation, Demonstration, Verbal cues, and Handouts Education comprehension: verbalized understanding, verbal cues required, and needs further education   GOALS: Goals reviewed with patient? Yes  SHORT TERM GOALS: Target date: 10/15/22  Pt will name average 8 items in salient, practical categories in 2 sessions Baseline: Goal status: IN PROGRESS  2.  Pt will communicate basic needs/wants with use of compensatory strategies in 3 sessions Baseline:  Goal status: IN PROGRESS  3.  Pt will demo understanding of sentence stimuli in structured tasks in 3 sessions Baseline:  Goal status: IN PROGRESS  4.  With extra time, pt will demo understanding of 2-spoken sentence stimuli in 3 sessions Baseline:  Goal status: IN PROGRESS   LONG TERM GOALS: Target date: 11/22/22  Pt will name average 8 items in simple categories (non-personal for pt) in 3 sessions Baseline:  Goal status: IN PROGRESS  2.  Pt will produce MLU of >6.5 average in 3-minute speech sample  Baseline:  Goal status: IN PROGRESS  3.  Pt family will demo appropriate cueing for pt's receptive and expressive language in 5 sessions Baseline:  Goal status: IN PROGRESS  4.  Pt will score higher on PROM than on initial reading in first 2 sessions Baseline:  Goal status: IN  PROGRESS   ASSESSMENT:  CLINICAL IMPRESSION: Patient is a 57 y.o. male who was seen today for treatment of expressive and receptive language. Verbal expression appears better today than in previous sessions - pt was 100% successful in communication augmenting with written words, and more spontaneous speech than previous session. Verbal expression cont to contain neologism and phonemic paraphasias. 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  OBJECTIVE IMPAIRMENTS: include expressive language, receptive language, aphasia, and dysphagia. These impairments are  limiting patient from return to work, household responsibilities, ADLs/IADLs, and effectively communicating at home and in community. Factors affecting potential to achieve goals and functional outcome are severity of impairments and limited insurance coverage for ST . Patient will benefit from skilled SLP services to address above impairments and improve overall function.  REHAB POTENTIAL: Good  PLAN:  SLP FREQUENCY: 1-2x/week  SLP DURATION:  20 sessions  PLANNED INTERVENTIONS: Language facilitation, Environmental controls, Trials of upgraded texture/liquids, Cueing hierachy, Internal/external aids, Functional tasks, Multimodal communication approach, SLP instruction and feedback, Compensatory strategies, and Patient/family education    [facility], CCC-SLP 10/04/2022, 9:47 AM

## 2022-10-05 LAB — BETA-2-GLYCOPROTEIN I ABS, IGG/M/A
Beta-2 Glyco I IgG: <9 GPI IgG units (ref 0–20)
Beta-2-Glycoprotein I IgA: <9 GPI IgA units (ref 0–25)
Beta-2-Glycoprotein I IgM: <9 GPI IgM units (ref 0–32)

## 2022-10-05 LAB — CARDIOLIPIN ANTIBODIES, IGG, IGM, IGA
Anticardiolipin IgA: <9 APL U/mL (ref 0–11)
Anticardiolipin IgG: <9 GPL U/mL (ref 0–14)
Anticardiolipin IgM: <9 MPL U/mL (ref 0–12)

## 2022-10-05 LAB — PROTEIN C, TOTAL: Protein C, Total: 121 % (ref 60–150)

## 2022-10-06 ENCOUNTER — Encounter: Payer: Self-pay | Admitting: *Deleted

## 2022-10-06 LAB — PROTEIN S ACTIVITY: Protein S Activity: 97 % (ref 63–140)

## 2022-10-06 LAB — PROTEIN C ACTIVITY: Protein C Activity: 124 % (ref 73–180)

## 2022-10-06 LAB — DRVVT MIX: dRVVT Mix: 43.7 s — ABNORMAL HIGH (ref 0.0–40.4)

## 2022-10-06 LAB — LUPUS ANTICOAGULANT PANEL
DRVVT: 53.9 s — ABNORMAL HIGH (ref 0.0–47.0)
PTT Lupus Anticoagulant: 34.5 s (ref 0.0–43.5)

## 2022-10-06 LAB — DRVVT CONFIRM: dRVVT Confirm: 1.2 ratio (ref 0.8–1.2)

## 2022-10-06 LAB — PROTEIN S, TOTAL: Protein S Ag, Total: 85 % (ref 60–150)

## 2022-10-06 NOTE — Progress Notes (Signed)
Faxed referral order, demographics and chart information to [facility] Dr. Isaiah Serge [phone].

## 2022-10-09 ENCOUNTER — Ambulatory Visit: Payer: PRIVATE HEALTH INSURANCE | Admitting: Occupational Therapy

## 2022-10-09 DIAGNOSIS — R208 Other disturbances of skin sensation: Secondary | ICD-10-CM

## 2022-10-09 DIAGNOSIS — R41840 Attention and concentration deficit: Secondary | ICD-10-CM

## 2022-10-09 DIAGNOSIS — I69353 Hemiplegia and hemiparesis following cerebral infarction affecting right non-dominant side: Secondary | ICD-10-CM

## 2022-10-09 DIAGNOSIS — R278 Other lack of coordination: Secondary | ICD-10-CM

## 2022-10-09 NOTE — Therapy (Signed)
OUTPATIENT OCCUPATIONAL THERAPY NEURO  Treatment Note  Patient Name: Cameron Hudson MRN: [id number] DOB:February 04, 1966, 57 y.o., male Today's Date: 10/09/2022  PCP: Creola Corn, MD REFERRING PROVIDER: Creola Corn, MD  END OF SESSION:  OT End of Session - 10/09/22 0600     Visit Number 6    Number of Visits 17    Date for OT Re-Evaluation 11/10/22    Authorization Type [employer] / [employer] [id number]    Authorization - Number of Visits 20   OT/PT combined   OT Start Time 0934    OT Stop Time 1018    OT Time Calculation (min) 44 min                  Past Medical History:  Diagnosis Date   Allergy    Asthma    CAD S/P percutaneous coronary angioplasty 06/20/2017   Inferior STEMI - 100% dRCA --> DES PCI with Synergy 3.0 x 24 (3.3 mm). Mild 10-30% disease in p-mRCA, Cx & LAD.  EF Normal.    Hemorrhoids    Hx of adenomatous polyp of colon 08/01/2019   Hyperlipidemia    IBS (irritable bowel syndrome)    Personal history of colonic polyps 2004   12 mm adenoma   Seasonal allergies    STEMI involving oth coronary artery of inferior wall 06/20/2017   12/18 PCI/DESx1 to dRCA, normal EF   Past Surgical History:  Procedure Laterality Date   CHOLECYSTECTOMY N/A 11/28/2019   Procedure: LAPAROSCOPIC CHOLECYSTECTOMY;  Surgeon: Abigail Miyamoto, MD;  Location: [facility];  Service: General;  Laterality: N/A;   COLONOSCOPY     COLONOSCOPY W/ POLYPECTOMY  08/27/2002; 08/30/2005; 01/06/2011   12mm adenoma, diverticulosis, hemorrhoids; normal;  diverticulosis and small hemorrhoids   CORONARY STENT INTERVENTION N/A 06/20/2017   Procedure: CORONARY STENT INTERVENTION;  Surgeon: Lennette Bihari, MD;  Location: [facility];  Service: Cardiovascular;  Laterality: N/A;   LEFT HEART CATH AND CORONARY ANGIOGRAPHY N/A 06/20/2017   Procedure: LEFT HEART CATH AND CORONARY ANGIOGRAPHY;  Surgeon: Lennette Bihari, MD;  Location: [facility];  Service: Cardiovascular;  Laterality:  N/A;   NASAL SINUS SURGERY  Dec 2011   POLYPECTOMY     Patient Active Problem List   Diagnosis Date Noted   Hx of adenomatous polyp of colon 08/01/2019   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   IFG (impaired fasting glucose) 08/11/2017   Hypogonadism, male 08/11/2017   Statin intolerance 08/09/2017   Encounter for long-term (current) use of medications 08/09/2017   Hyperlipidemia with target low density lipoprotein (LDL) cholesterol less than 70 mg/dL 40/98/1191   STEMI involving oth coronary artery of inferior wall 06/20/2017   CAD S/P percutaneous coronary angioplasty 06/20/2017   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   Acute pain of right shoulder 10/07/2016   Radicular pain 08/09/2016   Seasonal allergies 07/31/2013   History of chronic sinusitis 05/11/2012   Allergy 05/11/2012   Asthma, exercise induced 10/09/2010   Chronic sinusitis 10/09/2010   Perennial allergic rhinitis with seasonal variation 10/09/2010    ONSET DATE: 09/06/22  REFERRING DIAG: I63.9 (ICD-10-CM) - CVA (cerebral vascular accident)  THERAPY DIAG:  Hemiplegia and hemiparesis following cerebral infarction affecting right non-dominant side  Other disturbances of skin sensation  Other lack of coordination  Attention and concentration deficit  Rationale for Evaluation and Treatment: Rehabilitation  SUBJECTIVE:   SUBJECTIVE STATEMENT: Pt reports that weakness along ulnar distribution has improved, however reports being more aware of difficulties with  coordination and strength in R hand. Pt accompanied by: self and significant other (spouse [name])  PERTINENT HISTORY: Coronary artery disease, s/p inf STEMI and DES to occluded RCA in 05/2017  PRECAUTIONS: None  WEIGHT BEARING RESTRICTIONS: No  PAIN:  Are you having pain? No  FALLS: Has patient fallen in last 6 months? Yes. Number of falls 1 - collapsed with onset of CVA  LIVING ENVIRONMENT: Lives with: lives with their family (56 and 13 yo  daughters) Lives in: House/apartment Stairs: Yes: Internal: able to remain on main level will full bedroom/bathroom, does have 2nd floor - children's bedrooms are upstairs steps; on right going up and External: 4-5 steps; on right going up Has following equipment at home: None  PLOF: Independent and Independent with basic ADLs, sees pt's 2 days per week and admin 1.5 days  PATIENT GOALS: to make sure that I don't need OT  OBJECTIVE:   HAND DOMINANCE: Left  ADLs: Overall ADLs: Independent with bathing/dressing and bathroom transfers, no difficulty with clothing fasteners Transfers/ambulation related to ADLs: Independent Equipment: none  IADLs: Pt just returned home from hospital yesterday, has not yet attempted any IADLs.  Pt reports that he feels he could do them, but has not had the opportunity to attempt. Medication management: Spouse is currently assisting with dispensing medications  Financial management: Did go in to the clinic to do some payroll with supervision Handwriting: 100% legible  MOBILITY STATUS: Independent  POSTURE COMMENTS:  No Significant postural limitations  ACTIVITY TOLERANCE: Activity tolerance: WFL for tasks assessed on eval  FUNCTIONAL OUTCOME MEASURES: FOTO: 91, predicted 98 at d/c  UPPER EXTREMITY ROM:  WFL bilaterally   UPPER EXTREMITY MMT:   Grossly 5/5 bilaterally  HAND FUNCTION: Grip strength: Right: 90 lbs; Left: 100 lbs, Lateral pinch: Right: 23 lbs, Left: 21 lbs, and 3 point pinch: Right: 20 lbs, Left: 17 lbs  COORDINATION: Finger Nose Finger test: R mild decreased coordination and speed compared to L 9 Hole Peg test: Right: 28.78 sec; Left: 20.10 sec  SENSATION: Slight decreased sensation in R hand, along ulnar distribution  COGNITION: Overall cognitive status: Within functional limits for tasks assessed and requires slowing with verbal cues to allow for increased time to process. 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  OT then increased challenge to dual tasking with focus on discussing tasks over the weekend while still attending to task of removing Jenga pieces with R hand.  Pt demonstrating increased difficulty with addition of dual-task challenge. Alternating attention: completing connected items in order of written items on Left side of the page.  Pt demonstrating improved ability to keep up with place during altnerating nattention without attempts at naming items.  Engaged in discussion about phasing in and out of dual tasking and tasks requiring alternating attention to  increase success with tasks as needed.    10/04/22 Alternating attention: with connecting colored dots in order of the colors listed on the left side of the page.  Pt utilizing dash marks to keep up with place during alternating attention.  Pt demonstrating increased difficulty with attempts at naming words, requiring phonemic cues especially with grey and green.  Discussed phonics component.  Pt demonstrating good sequencing with alternating attention. 9 hole peg test: L: 28.91 sec and 22.44 sec on second attempt.  Pt reports difficulty getting started sometimes.  Discussed "warm up" exercises with finger flicks and thumb opposition prior to engaging in [facility] as needed.  Pt demonstrating each. Gross grasp: OT demonstrating ball toss with BUE at wall (suggesting trial of rebounder) with focus on gross grasp and control.  Suggested attempting slightly deflated ball to allow for utilization of all fingers with  grasp when catching ball. In-hand manipulation and translation: picking up marbles one at a time and translating finger tips to palm to challenge amount sustained in grasp then translating palm to fingers tip to return to container.  Pt demonstrating improved motor control and sustained grip with 4th and 5th digits to maintain increased amount of marbles in hand.    10/02/22 Grooved Pegs: with RUE for increased coordination. Pt placed pegs with one at a time and in hand manipulation and removed with attempts at use of tweezers but transitioning to use of tongs.  Pt completed with min difficulty with in-hand manipulation and translation from palm to fingers tips, but with zero drops with either technique.  Pt attempted to utilize plastic tweezers, however too flimsy for pegs therefore transitioned to use of rubber tongs to focus on grip strength and coordination.  Discussed functional carryover to work tasks and home making tasks. Tweezers: Utilized Veterinary surgeon to pick up small letter tiles with R hand for motor control and coordination.  OT challenging pt to complete while naming letters and numbers.  Pt with errors 25-50% of time, however with use of counting strategy pt with improved accuracy with naming numbers.  OT educated on motor task vs cognitive and motor dual tasking as pt with increased difficulty with completion with addition of cognitive challenge. Newman Pies toss : Newman Pies toss within R hand with focus on full hand use and coordination of 4th and 5th digits with grasp/release during tossing/catching.  OT educated on motor task and increased challenge with motor and motor dual task as well as cognitive and motor dual tasking.  OT educated on functional instances when pt may alternate between single focus and dual tasking and ability to adapt and alternate during routine, functional tasks.      PATIENT EDUCATION: Education details: ongoing condition specific education Person educated: Patient and  Spouse Education method: Explanation Education comprehension: verbalized understanding  HOME EXERCISE PROGRAM: Access Code: [email or web address] URL: [email or web address] Date: 09/28/2022 Prepared by: [facility] - Outpatient  Rehab - [facility]  Exercises - Putty Squeezes  - 1 x daily - 10 reps - Thumb Opposition with Putty  - 1 x daily - 10 reps - 3-Point Pinch with Putty  - 1 x daily - 10 reps - Finger Lumbricals with Putty  - 1 x daily - 10 reps - Marble Pick Up from Putty  - 1 x daily - 10 reps   GOALS: Goals reviewed with patient? Yes  SHORT TERM GOALS: Target date: 10/13/22  Pt will be Independent with coordination HEP to increase independence with ADLs/IADLs Baseline: Goal status: IN PROGRESS  2.  Pt will complete table top scanning activity without cues, demonstrating improved sustained attention to task. Baseline:  Goal status: IN PROGRESS  3.  Pt will complete dual task activity in mild distracting environment with 90% accuracy. Baseline:  Goal status: IN PROGRESS   LONG TERM GOALS: Target date: 11/10/22  Pt will demonstrate improved fine motor coordination for ADLs as evidenced by decreasing 9 hole peg test score for RUE by 5 secs Baseline: Right: 28.78 sec; Left: 20.10 sec Goal status: IN PROGRESS  2.  Pt will be able to engage in dual task activity with 100% accuracy to demonstrate increase awareness, organization, and sequencing. Baseline:  Goal status: IN PROGRESS  3.  Pt will complete a moderately challenging bill paying/computer task <2 errors due to aphasia. Baseline:  Goal status: IN PROGRESS  4.  Pt will complete a 3 step task to carryover to IADL/work tasks with <1 cue for recall/sequencing. Baseline:  Goal status: IN PROGRESS   ASSESSMENT:  CLINICAL IMPRESSION: Pt demonstrating difficulty with dual tasking with engaging in conversation about the weekend while engaging in coordination task with [place].  Pt reporting walking 3 mile  trial with friends over the weekend was fine physically, but as he fatigued his speech and even writing to aid in communication deteriorated.  Pt and spouse asking questions about timeline with recovery in regards to alternating attention and dual tasking, therapist answering within her scope.  Pt reports typing is a challenge, largely due to communication impairments and coordination of RUE.    PERFORMANCE DEFICITS: in functional skills including IADLs, coordination, sensation, Fine motor control, Gross motor control, endurance, and UE functional use, cognitive skills including attention, memory, orientation, problem solving, safety awareness, sequencing, thought, and understand, and psychosocial skills including environmental adaptation, habits, and routines and behaviors.   IMPAIRMENTS: are limiting patient from IADLs, work, leisure, and social participation.   CO-MORBIDITIES: may have co-morbidities  that affects occupational performance. Patient will benefit from skilled OT to address above impairments and improve overall function.  MODIFICATION OR ASSISTANCE TO COMPLETE EVALUATION: No modification of tasks or assist necessary to complete an evaluation.  OT OCCUPATIONAL PROFILE AND HISTORY: Problem focused assessment: Including review of records relating to presenting problem.  CLINICAL DECISION MAKING: LOW - limited treatment options, no task modification necessary  REHAB POTENTIAL: Good  EVALUATION COMPLEXITY: Low    PLAN:  OT FREQUENCY: 1-2x/week  OT DURATION: 8 weeks  PLANNED INTERVENTIONS: self care/ADL training, therapeutic exercise, therapeutic activity, neuromuscular re-education, functional mobility training, electrical stimulation, ultrasound, compression bandaging, moist heat, cryotherapy, patient/family education, cognitive remediation/compensation, psychosocial skills training, energy conservation, coping strategies training, and DME and/or AE instructions  RECOMMENDED  OTHER SERVICES: NA  CONSULTED AND AGREED WITH PLAN OF CARE: Patient and family member/caregiver  PLAN FOR NEXT SESSION: picking up items with tweezers/tongs, coordination, typing/computer use, dual tasking and multi-step commands for sequencing   Sira Adsit, OTR/L 10/09/2022, 10:40 AM

## 2022-10-10 ENCOUNTER — Telehealth: Payer: Self-pay | Admitting: *Deleted

## 2022-10-10 NOTE — Telephone Encounter (Signed)
Confirmed w/[facility] that referral information was received. Scheduled w/Dr. Isaiah Serge on 01/23/23 at 11:00 (1st available). He was added to cancellation list. Patient notified via MyChart message and packet will be mailed to his home.

## 2022-10-11 ENCOUNTER — Telehealth: Payer: Self-pay

## 2022-10-11 ENCOUNTER — Ambulatory Visit: Payer: PRIVATE HEALTH INSURANCE | Admitting: Occupational Therapy

## 2022-10-11 ENCOUNTER — Ambulatory Visit: Payer: PRIVATE HEALTH INSURANCE

## 2022-10-11 DIAGNOSIS — R4701 Aphasia: Secondary | ICD-10-CM

## 2022-10-11 DIAGNOSIS — I69353 Hemiplegia and hemiparesis following cerebral infarction affecting right non-dominant side: Secondary | ICD-10-CM | POA: Diagnosis not present

## 2022-10-11 DIAGNOSIS — R41840 Attention and concentration deficit: Secondary | ICD-10-CM

## 2022-10-11 DIAGNOSIS — R278 Other lack of coordination: Secondary | ICD-10-CM

## 2022-10-11 DIAGNOSIS — R208 Other disturbances of skin sensation: Secondary | ICD-10-CM

## 2022-10-11 NOTE — Telephone Encounter (Signed)
I contacted Cameron Hudson and she informed me that she will provide a copy of the operative note by the end of the business day. I assured her that I will make copies of the document and promptly return them to her.

## 2022-10-11 NOTE — Therapy (Signed)
OUTPATIENT SPEECH LANGUAGE PATHOLOGY APHASIA TREATMENT   Patient Name: Cameron Hudson MRN: [id number] DOB:[birth year], 57 y.o., male Today's Date: 10/11/2022  PCP: Creola Corn, MD REFERRING PROVIDER: [facility] MD Timothy Lasso- Doc)  END OF SESSION:  End of Session - 10/11/22 1105     Visit Number 8    Number of Visits 17    Date for SLP Re-Evaluation 11/22/22    Authorization Time Period ST= 20 visits (per case mgr)    Authorization - Visit Number 8    Authorization - Number of Visits 20    Activity Tolerance Patient tolerated treatment well                 Past Medical History:  Diagnosis Date   Allergy    Asthma    CAD S/P percutaneous coronary angioplasty 06/20/2017   Inferior STEMI - 100% dRCA --> DES PCI with Synergy 3.0 x 24 (3.3 mm). Mild 10-30% disease in p-mRCA, Cx & LAD.  EF Normal.    Hemorrhoids    Hx of adenomatous polyp of colon 08/01/2019   Hyperlipidemia    IBS (irritable bowel syndrome)    Personal history of colonic polyps 2004   12 mm adenoma   Seasonal allergies    STEMI involving oth coronary artery of inferior wall 06/20/2017   12/18 PCI/DESx1 to dRCA, normal EF   Past Surgical History:  Procedure Laterality Date   CHOLECYSTECTOMY N/A 11/28/2019   Procedure: LAPAROSCOPIC CHOLECYSTECTOMY;  Surgeon: Abigail Miyamoto, MD;  Location: [facility];  Service: General;  Laterality: N/A;   COLONOSCOPY     COLONOSCOPY W/ POLYPECTOMY  08/27/2002; 08/30/2005; 01/06/2011   12mm adenoma, diverticulosis, hemorrhoids; normal;  diverticulosis and small hemorrhoids   CORONARY STENT INTERVENTION N/A 06/20/2017   Procedure: CORONARY STENT INTERVENTION;  Surgeon: Lennette Bihari, MD;  Location: [facility];  Service: Cardiovascular;  Laterality: N/A;   LEFT HEART CATH AND CORONARY ANGIOGRAPHY N/A 06/20/2017   Procedure: LEFT HEART CATH AND CORONARY ANGIOGRAPHY;  Surgeon: Lennette Bihari, MD;  Location: [facility];  Service:  Cardiovascular;  Laterality: N/A;   NASAL SINUS SURGERY  Dec 2011   POLYPECTOMY     Patient Active Problem List   Diagnosis Date Noted   Hx of adenomatous polyp of colon 08/01/2019   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   IFG (impaired fasting glucose) 08/11/2017   Hypogonadism, male 08/11/2017   Statin intolerance 08/09/2017   Encounter for long-term (current) use of medications 08/09/2017   Hyperlipidemia with target low density lipoprotein (LDL) cholesterol less than 70 mg/dL 40/98/1191   STEMI involving oth coronary artery of inferior wall 06/20/2017   CAD S/P percutaneous coronary angioplasty 06/20/2017   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 They seemed surprised he had a stroke." Pt accompanied by: significant other wife-Jill  PERTINENT HISTORY: HLD, CAD,S/P one stent  PAIN: Are you having pain? No  FALLS: Has patient fallen in last 6 months?  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   PATIENT REPORTED OUTCOME MEASURES (PROM): Communication Effectiveness Survey: (CES) :Pt filled out CES  and scored himself 13/32 (higher numbers indicate better QOL/effective communication). "1" were scored for participating in conversation with strangers in a quiet place, conversing with a stranger over the telephone, conversing in a noisy environment, and having a  conversation with someone at a distance. [name] scored pt 10/32, with "1" for participating in a conversation with strangers in a quiet place, with a familiar person over the telephone, with a stranger over the telephone, in a noisy environment, with a friend when emotionally upset or angry, and having a conversation with someone at a distance.. Pt's higher score likely indicates his awareness of errors is not accurate due to his aphasia.   TODAY'S TREATMENT:                                                                                                                                         10/11/22: SLP targeted salient and meaningful language with [place] today, engaging in simple conversation with some rare mod complex conversation which again was functional with using written cues. SLP cont'd to then return to error words and assist pt production - success 25% independently and this incr'd to 50% with mod/max A. Simple divergent naming task with basic category (colors) completed with pt generating 7 colors independently without any aphasic errors. He req'd mod A to generate 4 more and needed max A for "teal" which pt initiated and used compensatory strategy of description saying, "[employer] color".   10/04/22: Pt functionally told SLP about the weekend with rare min questioning cues from SLP, and second attempts by pt. SLP targeted functional therapy in a conversational manner - this was fostered by pt writing words he could not articulate correctly and SLP practicing those words with pt. SLP cont to model this type of practice for [name] - as pt's schedule has ~9 hours of drive time/week that he practices with [facility]. SLP cont to encourage conversational practice at times other than car time. SLP reiterated strongly that pt must practice the target words afterwards for maximum benefit for practice. Pt wife asks SLP about strategies for pronoun expression. SLP encouraged pt to slow his rate of speech and  told wife/pt that pronouns may well be one thing that pt will need to pay special attention to from this point onward with his spontaneous speech.  09/28/22: Pt used written augmentation extensively today in 15-minutes simple to mod complex conversation with SLP and was nearly functional with SLP semantic and articulatory cues. SLP educated wife how to use semantic cues with pt today via model and direct instruction. Pt cont to work with constant therapy at home 40-70 minutes/day.  09/27/22:SLP provided CES today for both pt and [name]; Results above. SLP targeted functional language with salient and  high interest topics in simple conversation. Pt was nearly functional but req'd SLP questioning cues rarely. Pt augmented verbal communication with written communication on a pad successfully 90% of the time. Pt's spontaneous speech is more grammatically correct and more prevalent today than in previous sessions.   SLP then linked clinician CT to pt's CT profile.   09/20/22: SLP targeted expressive and receptive language. SLP modeled practice for home tasks and provided wife with suggested cues for target word. Pt benefited today from semantic cues. Pt filled in 2 letters for body parts with 4/6 success and incr'd to 6/6 with min A. Written cues from pt did not appear to assist verbal expression. Neologism and semantic errors prevalent, with incr'd awareness than Monday.  09/18/22:QAB completed today. Score above. Pt with less frustration today - self-calming behavior, "It's ok it's ok" x4. SLP provided education on [facility] telling pt/wife that pt could work on it if frustration level does not inhibit ability to complete. Homework to say family names, other automatic speech, and cloze phrases/sentences.  SLP provided clinic number for [facility] and suggested wife call to obtain information about possible summer sessions.  09/14/22: Expressed frustration re: hwk for sentence completions. Demo'd good comprehension  of task, correct completion of sentences, and accurate writing; however, usual verbal reading errors noted, particularly content words. Reduced awareness of errors demonstrated initially, which improved throughout session. SLP educated rationale of task, including task hierarchy (starting with easier, more automatic tasks and then advancing) and targeted deficits (comprehension and expression). Writing noted as relative strength with pt often writing key words to optimize listener comprehension given usual neologisms and paraphasias. SLP targeted verbal expression of personally relevant words from his personal notebook. SLP demonstrated recommended cueing hierarchy to maximize patient performance. Pt benefited from usual verbal/visual modeling fading to visual modeling (mouthing sounds) to saying word in response to a question to saying word independently with intermittent choral practice needed. See patient instructions for additional education and HEP. Briefly discussed auditory comprehension strategies (slower rate, repetition, rephrasing, confirmation); additional education warranted.   PATIENT EDUCATION: Education details: see above Person educated: Patient and Spouse Education method: Explanation, Demonstration, Verbal cues, and Handouts Education comprehension: verbalized understanding, verbal cues required, and needs further education   GOALS: Goals reviewed with patient? Yes  SHORT TERM GOALS: Target date: 10/15/22  Pt will name average 8 items in salient, practical categories in 2 sessions Baseline: 10/11/22 Goal status: IN PROGRESS  2.  Pt will communicate basic needs/wants with use of compensatory strategies in 3 sessions Baseline: 10/04/22, 10/11/22 Goal status: IN PROGRESS  3.  Pt will demo understanding of sentence stimuli in structured tasks in 3 sessions Baseline: 10/04/22, 10/11/22 Goal status: IN PROGRESS  4.  With extra time, pt will demo understanding of 2-spoken sentence  stimuli in 3 sessions Baseline:  Goal status: IN PROGRESS   LONG TERM GOALS: Target date: 11/22/22  Pt will name average 8 items in simple categories (non-personal for pt) in 3 sessions Baseline:  Goal status: IN PROGRESS  2.  Pt will produce MLU of >6.5 average in 3-minute speech sample  Baseline:  Goal status: IN PROGRESS  3.  Pt family will demo appropriate cueing for pt's receptive and expressive language in 5 sessions Baseline:  Goal status: IN PROGRESS  4.  Pt will score higher on PROM than on initial reading in first 2 sessions Baseline:  Goal status: IN PROGRESS   ASSESSMENT:  CLINICAL IMPRESSION: Patient is a 57 y.o. male who  was seen today for treatment of expressive and receptive language. Verbal expression cont to contain neologism and phonemic paraphasias. 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  OBJECTIVE IMPAIRMENTS: include expressive language, receptive language, aphasia, and dysphagia. These impairments are limiting patient from return to work, household responsibilities, ADLs/IADLs, and effectively communicating at home and in community. Factors affecting potential to achieve goals and functional outcome are severity of impairments and limited insurance coverage for ST . Patient will benefit from skilled SLP services to address above impairments and improve overall function.  REHAB POTENTIAL: Good  PLAN:  SLP FREQUENCY: 1-2x/week  SLP DURATION:  20 sessions  PLANNED INTERVENTIONS: Language facilitation, Environmental controls, Trials of upgraded texture/liquids, Cueing hierachy, Internal/external aids, Functional tasks, Multimodal communication approach, SLP instruction and feedback, Compensatory strategies, and Patient/family education    [facility], CCC-SLP 10/11/2022, 11:05 AM

## 2022-10-11 NOTE — Therapy (Signed)
OUTPATIENT OCCUPATIONAL THERAPY NEURO  Treatment Note  Patient Name: Cameron Hudson MRN: [id number] DOB:1966-03-17, 57 y.o., male Today's Date: 10/11/2022  PCP: Creola Corn, MD REFERRING PROVIDER: Creola Corn, MD  END OF SESSION:  OT End of Session - 10/11/22 1020     Visit Number 7    Number of Visits 17    Date for OT Re-Evaluation 11/10/22    Authorization Type [employer] / [employer] [id number]    Authorization - Number of Visits 20   OT/PT combined   OT Start Time 1018    OT Stop Time 1100    OT Time Calculation (min) 42 min                  Past Medical History:  Diagnosis Date   Allergy    Asthma    CAD S/P percutaneous coronary angioplasty 06/20/2017   Inferior STEMI - 100% dRCA --> DES PCI with Synergy 3.0 x 24 (3.3 mm). Mild 10-30% disease in p-mRCA, Cx & LAD.  EF Normal.    Hemorrhoids    Hx of adenomatous polyp of colon 08/01/2019   Hyperlipidemia    IBS (irritable bowel syndrome)    Personal history of colonic polyps 2004   12 mm adenoma   Seasonal allergies    STEMI involving oth coronary artery of inferior wall 06/20/2017   12/18 PCI/DESx1 to dRCA, normal EF   Past Surgical History:  Procedure Laterality Date   CHOLECYSTECTOMY N/A 11/28/2019   Procedure: LAPAROSCOPIC CHOLECYSTECTOMY;  Surgeon: Abigail Miyamoto, MD;  Location: [facility];  Service: General;  Laterality: N/A;   COLONOSCOPY     COLONOSCOPY W/ POLYPECTOMY  08/27/2002; 08/30/2005; 01/06/2011   12mm adenoma, diverticulosis, hemorrhoids; normal;  diverticulosis and small hemorrhoids   CORONARY STENT INTERVENTION N/A 06/20/2017   Procedure: CORONARY STENT INTERVENTION;  Surgeon: Lennette Bihari, MD;  Location: [facility];  Service: Cardiovascular;  Laterality: N/A;   LEFT HEART CATH AND CORONARY ANGIOGRAPHY N/A 06/20/2017   Procedure: LEFT HEART CATH AND CORONARY ANGIOGRAPHY;  Surgeon: Lennette Bihari, MD;  Location: [facility];  Service: Cardiovascular;  Laterality:  N/A;   NASAL SINUS SURGERY  Dec 2011   POLYPECTOMY     Patient Active Problem List   Diagnosis Date Noted   Hx of adenomatous polyp of colon 08/01/2019   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   IFG (impaired fasting glucose) 08/11/2017   Hypogonadism, male 08/11/2017   Statin intolerance 08/09/2017   Encounter for long-term (current) use of medications 08/09/2017   Hyperlipidemia with target low density lipoprotein (LDL) cholesterol less than 70 mg/dL 40/98/1191   STEMI involving oth coronary artery of inferior wall 06/20/2017   CAD S/P percutaneous coronary angioplasty 06/20/2017   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   Acute pain of right shoulder 10/07/2016   Radicular pain 08/09/2016   Seasonal allergies 07/31/2013   History of chronic sinusitis 05/11/2012   Allergy 05/11/2012   Asthma, exercise induced 10/09/2010   Chronic sinusitis 10/09/2010   Perennial allergic rhinitis with seasonal variation 10/09/2010    ONSET DATE: 09/06/22  REFERRING DIAG: I63.9 (ICD-10-CM) - CVA (cerebral vascular accident)  THERAPY DIAG:  Hemiplegia and hemiparesis following cerebral infarction affecting right non-dominant side  Other disturbances of skin sensation  Other lack of coordination  Attention and concentration deficit  Rationale for Evaluation and Treatment: Rehabilitation  SUBJECTIVE:   SUBJECTIVE STATEMENT: Pt reports that weakness along ulnar distribution has improved, however reports being more aware of difficulties with  coordination and strength in R hand. Pt accompanied by: self and significant other (spouse [name])  PERTINENT HISTORY: Coronary artery disease, s/p inf STEMI and DES to occluded RCA in 05/2017  PRECAUTIONS: None  WEIGHT BEARING RESTRICTIONS: No  PAIN:  Are you having pain? No  FALLS: Has patient fallen in last 6 months? Yes. Number of falls 1 - collapsed with onset of CVA  LIVING ENVIRONMENT: Lives with: lives with their family (61 and 68 yo  daughters) Lives in: House/apartment Stairs: Yes: Internal: able to remain on main level will full bedroom/bathroom, does have 2nd floor - children's bedrooms are upstairs steps; on right going up and External: 4-5 steps; on right going up Has following equipment at home: None  PLOF: Independent and Independent with basic ADLs, sees pt's 2 days per week and admin 1.5 days  PATIENT GOALS: to make sure that I don't need OT  OBJECTIVE:   HAND DOMINANCE: Left  ADLs: Overall ADLs: Independent with bathing/dressing and bathroom transfers, no difficulty with clothing fasteners Transfers/ambulation related to ADLs: Independent Equipment: none  IADLs: Pt just returned home from hospital yesterday, has not yet attempted any IADLs.  Pt reports that he feels he could do them, but has not had the opportunity to attempt. Medication management: Spouse is currently assisting with dispensing medications  Financial management: Did go in to the clinic to do some payroll with supervision Handwriting: 100% legible  MOBILITY STATUS: Independent  POSTURE COMMENTS:  No Significant postural limitations  ACTIVITY TOLERANCE: Activity tolerance: WFL for tasks assessed on eval  FUNCTIONAL OUTCOME MEASURES: FOTO: 91, predicted 98 at d/c  UPPER EXTREMITY ROM:  WFL bilaterally   UPPER EXTREMITY MMT:   Grossly 5/5 bilaterally  HAND FUNCTION: Grip strength: Right: 90 lbs; Left: 100 lbs, Lateral pinch: Right: 23 lbs, Left: 21 lbs, and 3 point pinch: Right: 20 lbs, Left: 17 lbs  COORDINATION: Finger Nose Finger test: R mild decreased coordination and speed compared to L 9 Hole Peg test: Right: 28.78 sec; Left: 20.10 sec  SENSATION: Slight decreased sensation in R hand, along ulnar distribution  COGNITION: Overall cognitive status: Within functional limits for tasks assessed and requires slowing with verbal cues to allow for increased time to process. 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  Pt demonstrating mild shakiness with increased challenge for precision. Small dowels: engaged in picking up "pick up sticks" with focus on [facility] and pinch to pick up with focus on precision and motor control.  Then attempting to place stick through slots with focus on [facility] with pinch and wrist movements to challenge motor control.  Pt with intermittent difficulty with picking up do to decreased sensation and mild shakiness with precision pinch.   Multi-tasking: Engaged in conversation during above coordination tasks to challenge cognitive dual tasking.  Pt demonstrating decreased attention to task and coordination when attempting to divide attention to engage in discussion as well.  This is compounded due to aphasia requiring increased time to identify and/or write appropriate words.  Pt frequently stopping coordination task to engage in discussion.     10/09/22 Jenga: removing Jenga pieces from stack and placing on top of stack with focus on isolated index finger, 3 jaw chuck, and use of sensation to remove selected pieces.  OT then increased challenge to dual tasking with focus on discussing tasks over the weekend while still attending to task of removing Jenga pieces with R hand.  Pt demonstrating increased difficulty with addition of dual-task challenge. Alternating attention: completing connected items in order of written items on Left side of the page.  Pt demonstrating improved ability to keep up with place during altnerating nattention  without attempts at naming items.  Engaged in discussion about phasing in and out of dual tasking and tasks requiring alternating attention to increase success with tasks as needed.    10/04/22 Alternating attention: with connecting colored dots in order of the colors listed on the left side of the page.  Pt utilizing dash marks to keep up with place during alternating attention.  Pt demonstrating increased difficulty with attempts at naming words, requiring phonemic cues especially with grey and green.  Discussed phonics component.  Pt demonstrating good sequencing with alternating attention. 9 hole peg test: L: 28.91 sec and 22.44 sec on second attempt.  Pt reports difficulty getting started sometimes.  Discussed "warm up" exercises with finger flicks and thumb opposition prior to engaging in [facility] as needed.  Pt demonstrating each. Gross grasp: OT demonstrating ball toss with BUE at wall (suggesting trial of rebounder) with focus on gross grasp and control.  Suggested attempting slightly deflated ball to allow for utilization of all fingers with grasp when catching ball. In-hand manipulation and translation: picking up marbles one at a time and translating finger tips to palm to challenge amount sustained in grasp then translating palm to fingers tip to return to container.  Pt demonstrating improved motor control and sustained grip with 4th and 5th digits to maintain increased amount of marbles in hand.    PATIENT EDUCATION: Education details: ongoing condition specific education Person educated: Patient and Spouse Education method: Explanation Education comprehension: verbalized understanding  HOME EXERCISE PROGRAM: Access Code: [email or web address] URL: [email or web address] Date: 09/28/2022 Prepared by: [facility] Outpatient  Rehab - [facility]  Exercises - Putty Squeezes  - 1 x daily - 10 reps - Thumb Opposition with Putty  - 1 x daily - 10 reps - 3-Point Pinch with Putty  -  1 x daily - 10 reps - Finger Lumbricals with Putty  - 1 x daily - 10 reps - Marble Pick Up from Putty  - 1 x daily - 10 reps   GOALS: Goals reviewed with patient? Yes  SHORT TERM GOALS: Target date: 10/13/22  Pt will be Independent with coordination HEP to increase independence with ADLs/IADLs Baseline: Goal status: MET - 10/11/22  2.  Pt will complete table top scanning activity without cues, demonstrating improved sustained attention to task. Baseline:  Goal status: MET - 10/11/22  3.  Pt will complete dual task activity in mild distracting environment with 90% accuracy. Baseline:  Goal status: IN PROGRESS   LONG TERM GOALS: Target date: 11/10/22  Pt will demonstrate improved fine motor coordination for ADLs as evidenced by decreasing 9 hole peg test score for RUE  by 5 secs Baseline: Right: 28.78 sec; Left: 20.10 sec Goal status: IN PROGRESS  2.  Pt will be able to engage in dual task activity with 100% accuracy to demonstrate increase awareness, organization, and sequencing. Baseline:  Goal status: IN PROGRESS  3.  Pt will complete a moderately challenging bill paying/computer task <2 errors due to aphasia. Baseline:  Goal status: IN PROGRESS  4.  Pt will complete a 3 step task to carryover to IADL/work tasks with <1 cue for recall/sequencing. Baseline:  Goal status: IN PROGRESS   ASSESSMENT:  CLINICAL IMPRESSION: Pt demonstrating difficulty with dual tasking with engaging in conversation about work related tasks, however pt very passionate about his work which was obvious during discussion.  Pt still relying on writing pad for specific words, which impacts his ability to truly engage in dual tasks when conversation is included.  Pt frequently stopping [facility] task to engage in conversation aspect, however able to return to task when directed to again attempt dual tasking.  Pt with mild shakiness with more precise fine motor control tasks.  PERFORMANCE DEFICITS: in  functional skills including IADLs, coordination, sensation, Fine motor control, Gross motor control, endurance, and UE functional use, cognitive skills including attention, memory, orientation, problem solving, safety awareness, sequencing, thought, and understand, and psychosocial skills including environmental adaptation, habits, and routines and behaviors.   IMPAIRMENTS: are limiting patient from IADLs, work, leisure, and social participation.   CO-MORBIDITIES: may have co-morbidities  that affects occupational performance. Patient will benefit from skilled OT to address above impairments and improve overall function.  MODIFICATION OR ASSISTANCE TO COMPLETE EVALUATION: No modification of tasks or assist necessary to complete an evaluation.  OT OCCUPATIONAL PROFILE AND HISTORY: Problem focused assessment: Including review of records relating to presenting problem.  CLINICAL DECISION MAKING: LOW - limited treatment options, no task modification necessary  REHAB POTENTIAL: Good  EVALUATION COMPLEXITY: Low    PLAN:  OT FREQUENCY: 1-2x/week  OT DURATION: 8 weeks  PLANNED INTERVENTIONS: self care/ADL training, therapeutic exercise, therapeutic activity, neuromuscular re-education, functional mobility training, electrical stimulation, ultrasound, compression bandaging, moist heat, cryotherapy, patient/family education, cognitive remediation/compensation, psychosocial skills training, energy conservation, coping strategies training, and DME and/or AE instructions  RECOMMENDED OTHER SERVICES: NA  CONSULTED AND AGREED WITH PLAN OF CARE: Patient and family member/caregiver  PLAN FOR NEXT SESSION: picking up items with tweezers/tongs, coordination, typing/computer use, dual tasking and multi-step commands for sequencing   Anushka Hartinger, OTR/L 10/11/2022, 10:27 AM

## 2022-10-11 NOTE — Telephone Encounter (Signed)
-----   Message from Rana Snare, NP sent at 10/10/2022  5:12 PM EDT ----- Please call his wife-was she able to get a copy of the operative note from his recent surgery?

## 2022-10-16 ENCOUNTER — Ambulatory Visit: Payer: PRIVATE HEALTH INSURANCE | Admitting: Occupational Therapy

## 2022-10-16 DIAGNOSIS — R41840 Attention and concentration deficit: Secondary | ICD-10-CM

## 2022-10-16 DIAGNOSIS — I69353 Hemiplegia and hemiparesis following cerebral infarction affecting right non-dominant side: Secondary | ICD-10-CM | POA: Diagnosis not present

## 2022-10-16 DIAGNOSIS — R278 Other lack of coordination: Secondary | ICD-10-CM

## 2022-10-16 DIAGNOSIS — R208 Other disturbances of skin sensation: Secondary | ICD-10-CM

## 2022-10-16 LAB — PROTHROMBIN GENE MUTATION

## 2022-10-16 NOTE — Therapy (Signed)
OUTPATIENT OCCUPATIONAL THERAPY NEURO  Treatment Note  Patient Name: Cameron Hudson MRN: [id number] DOB:[birth year], 57 y.o., male Today's Date: 10/16/2022  PCP: Creola Corn, MD REFERRING PROVIDER: Creola Corn, MD  END OF SESSION:  OT End of Session - 10/16/22 0934     Visit Number 8    Number of Visits 17    Date for OT Re-Evaluation 11/10/22    Authorization Type [employer] / [employer] [id number]    Authorization - Number of Visits 20   OT/PT combined   OT Start Time 0932    OT Stop Time 1015    OT Time Calculation (min) 43 min                   Past Medical History:  Diagnosis Date   Allergy    Asthma    CAD S/P percutaneous coronary angioplasty 06/20/2017   Inferior STEMI - 100% dRCA --> DES PCI with Synergy 3.0 x 24 (3.3 mm). Mild 10-30% disease in p-mRCA, Cx & LAD.  EF Normal.    Hemorrhoids    Hx of adenomatous polyp of colon 08/01/2019   Hyperlipidemia    IBS (irritable bowel syndrome)    Personal history of colonic polyps 2004   12 mm adenoma   Seasonal allergies    STEMI involving oth coronary artery of inferior wall 06/20/2017   12/18 PCI/DESx1 to dRCA, normal EF   Past Surgical History:  Procedure Laterality Date   CHOLECYSTECTOMY N/A 11/28/2019   Procedure: LAPAROSCOPIC CHOLECYSTECTOMY;  Surgeon: Abigail Miyamoto, MD;  Location: [facility];  Service: General;  Laterality: N/A;   COLONOSCOPY     COLONOSCOPY W/ POLYPECTOMY  08/27/2002; 08/30/2005; 01/06/2011   12mm adenoma, diverticulosis, hemorrhoids; normal;  diverticulosis and small hemorrhoids   CORONARY STENT INTERVENTION N/A 06/20/2017   Procedure: CORONARY STENT INTERVENTION;  Surgeon: Lennette Bihari, MD;  Location: [facility];  Service: Cardiovascular;  Laterality: N/A;   LEFT HEART CATH AND CORONARY ANGIOGRAPHY N/A 06/20/2017   Procedure: LEFT HEART CATH AND CORONARY ANGIOGRAPHY;  Surgeon: Lennette Bihari, MD;  Location: [facility];  Service: Cardiovascular;  Laterality:  N/A;   NASAL SINUS SURGERY  Dec 2011   POLYPECTOMY     Patient Active Problem List   Diagnosis Date Noted   Hx of adenomatous polyp of colon 08/01/2019   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   IFG (impaired fasting glucose) 08/11/2017   Hypogonadism, male 08/11/2017   Statin intolerance 08/09/2017   Encounter for long-term (current) use of medications 08/09/2017   Hyperlipidemia with target low density lipoprotein (LDL) cholesterol less than 70 mg/dL 40/98/1191   STEMI involving oth coronary artery of inferior wall 06/20/2017   CAD S/P percutaneous coronary angioplasty 06/20/2017   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   Acute pain of right shoulder 10/07/2016   Radicular pain 08/09/2016   Seasonal allergies 07/31/2013   History of chronic sinusitis 05/11/2012   Allergy 05/11/2012   Asthma, exercise induced 10/09/2010   Chronic sinusitis 10/09/2010   Perennial allergic rhinitis with seasonal variation 10/09/2010    ONSET DATE: 09/06/22  REFERRING DIAG: I63.9 (ICD-10-CM) - CVA (cerebral vascular accident)  THERAPY DIAG:  Hemiplegia and hemiparesis following cerebral infarction affecting right non-dominant side  Other disturbances of skin sensation  Other lack of coordination  Attention and concentration deficit  Rationale for Evaluation and Treatment: Rehabilitation  SUBJECTIVE:   SUBJECTIVE STATEMENT: Pt reports parents were in town over the weekend and they made spring rolls.  Pt accompanied by: self and significant other (spouse [name])  PERTINENT HISTORY: Coronary artery disease, s/p inf STEMI and DES to occluded RCA in 05/2017  PRECAUTIONS: None  WEIGHT BEARING RESTRICTIONS: No  PAIN:  Are you having pain? No  FALLS: Has patient fallen in last 6 months? Yes. Number of falls 1 - collapsed with onset of CVA  LIVING ENVIRONMENT: Lives with: lives with their family (71 and 60 yo daughters) Lives in: House/apartment Stairs: Yes: Internal: able to remain on main  level will full bedroom/bathroom, does have 2nd floor - children's bedrooms are upstairs steps; on right going up and External: 4-5 steps; on right going up Has following equipment at home: None  PLOF: Independent and Independent with basic ADLs, sees pt's 2 days per week and admin 1.5 days  PATIENT GOALS: to make sure that I don't need OT  OBJECTIVE:   HAND DOMINANCE: Left  ADLs: Overall ADLs: Independent with bathing/dressing and bathroom transfers, no difficulty with clothing fasteners Transfers/ambulation related to ADLs: Independent Equipment: none  IADLs: Pt just returned home from hospital yesterday, has not yet attempted any IADLs.  Pt reports that he feels he could do them, but has not had the opportunity to attempt. Medication management: Spouse is currently assisting with dispensing medications  Financial management: Did go in to the clinic to do some payroll with supervision Handwriting: 100% legible  MOBILITY STATUS: Independent  POSTURE COMMENTS:  No Significant postural limitations  ACTIVITY TOLERANCE: Activity tolerance: WFL for tasks assessed on eval  FUNCTIONAL OUTCOME MEASURES: FOTO: 91, predicted 98 at d/c  UPPER EXTREMITY ROM:  WFL bilaterally   UPPER EXTREMITY MMT:   Grossly 5/5 bilaterally  HAND FUNCTION: Grip strength: Right: 90 lbs; Left: 100 lbs, Lateral pinch: Right: 23 lbs, Left: 21 lbs, and 3 point pinch: Right: 20 lbs, Left: 17 lbs  COORDINATION: Finger Nose Finger test: R mild decreased coordination and speed compared to L 9 Hole Peg test: Right: 28.78 sec; Left: 20.10 sec  SENSATION: Slight decreased sensation in R hand, along ulnar distribution  COGNITION: Overall cognitive status: Within functional limits for tasks assessed and requires slowing with verbal cues to allow for increased time to process. 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  Pt demonstrating mild-mod difficulty with dual tasking, occasionally having to stop to either answer question of focus on coordination task.  Pt challenged to remove pegs and translate fingers > palm > fingers tips to place in container to further challenge [facility] [place] - replicating pattern with various sized piece with focus on picking up and rotating.  Pt with increased difficulty with multi-tasking, transitioning to using dominant LUE.  OT educated on functional carryover of multi-tasking/dual tasking and typical ability to phase in and out as needed but having to focus on it more right  now.    10/11/22 Coordination: Animal upon animal: engaged in Landscape architect game with RUE with focus on picking up animal pieces and rotating to place onto stack with focus on grasp and coordination.  Pt demonstrating mild shakiness with increased challenge for precision. Small dowels: engaged in picking up "pick up sticks" with focus on [facility] and pinch to pick up with focus on precision and motor control.  Then attempting to place stick through slots with focus on [facility] with pinch and wrist movements to challenge motor control.  Pt with intermittent difficulty with picking up do to decreased sensation and mild shakiness with precision pinch.   Multi-tasking: Engaged in conversation during above coordination tasks to challenge cognitive dual tasking.  Pt demonstrating decreased attention to task and coordination when attempting to divide attention to engage in discussion as well.  This is compounded due to aphasia requiring increased time to identify and/or write  appropriate words.  Pt frequently stopping coordination task to engage in discussion.     10/09/22 Jenga: removing Jenga pieces from stack and placing on top of stack with focus on isolated index finger, 3 jaw chuck, and use of sensation to remove selected pieces.  OT then increased challenge to dual tasking with focus on discussing tasks over the weekend while still attending to task of removing Jenga pieces with R hand.  Pt demonstrating increased difficulty with addition of dual-task challenge. Alternating attention: completing connected items in order of written items on Left side of the page.  Pt demonstrating improved ability to keep up with place during altnerating nattention without attempts at naming items.  Engaged in discussion about phasing in and out of dual tasking and tasks requiring alternating attention to increase success with tasks as needed.     PATIENT EDUCATION: Education details: ongoing condition specific education Person educated: Patient and Spouse Education method: Explanation Education comprehension: verbalized understanding  HOME EXERCISE PROGRAM: Access Code: [email or web address] URL: [email or web address] Date: 09/28/2022 Prepared by: [facility] - Outpatient  Rehab - [facility]  Exercises - Putty Squeezes  - 1 x daily - 10 reps - Thumb Opposition with Putty  - 1 x daily - 10 reps - 3-Point Pinch with Putty  - 1 x daily - 10 reps - Finger Lumbricals with Putty  - 1 x daily - 10 reps - Marble Pick Up from Putty  - 1 x daily - 10 reps   GOALS: Goals reviewed with patient? Yes  SHORT TERM GOALS: Target date: 10/13/22  Pt will be Independent with coordination HEP to increase independence with ADLs/IADLs Baseline: Goal status: MET - 10/11/22  2.  Pt will complete table top scanning activity without cues, demonstrating improved sustained attention to task. Baseline:  Goal status: MET - 10/11/22  3.  Pt will complete dual task activity in mild  distracting environment with 90% accuracy. Baseline:  Goal status: IN PROGRESS   LONG TERM GOALS: Target date: 11/10/22  Pt will demonstrate improved fine motor coordination for ADLs as evidenced by decreasing 9 hole peg test score for RUE by 5 secs Baseline: Right: 28.78 sec; Left: 20.10 sec Goal status: IN PROGRESS  2.  Pt will be able to engage in dual task activity with 100% accuracy to demonstrate increase awareness, organization, and sequencing. Baseline:  Goal status: IN PROGRESS  3.  Pt will complete a moderately challenging bill paying/computer task <2 errors due to aphasia. Baseline:  Goal status: IN PROGRESS  4.  Pt will complete a 3 step task  to carryover to IADL/work tasks with <1 cue for recall/sequencing. Baseline:  Goal status: IN PROGRESS   ASSESSMENT:  CLINICAL IMPRESSION: Pt reporting 30% less strength on RUE when completing typical exercise routine as well as noticing some decrease in leg strength and endurance.  Discussed possible use of weight stack and/or more dynamic tasks to incorporate into future sessions. Pt reports utilizing work out app that he tracks progress, therefore allowing ability to see change in strength and endurance.  Pt demonstrating difficulty with dual tasking with engaging in conversation about work related tasks and schooling procedure.  Pt also with one instance of receptive aphasia requiring therapist to repeat herself x2 as pt with decreased understanding of conversation while dual tasking.  Pt still relying on writing pad for specific words, which impacts his ability to truly engage in dual tasks when conversation is included.  Pt frequently alternating between [facility] and cognitive tasks with improved ability to alternate between.  Pt with mild shakiness with more precise fine motor control tasks.  PERFORMANCE DEFICITS: in functional skills including IADLs, coordination, sensation, Fine motor control, Gross motor control, endurance, and UE  functional use, cognitive skills including attention, memory, orientation, problem solving, safety awareness, sequencing, thought, and understand, and psychosocial skills including environmental adaptation, habits, and routines and behaviors.   IMPAIRMENTS: are limiting patient from IADLs, work, leisure, and social participation.   CO-MORBIDITIES: may have co-morbidities  that affects occupational performance. Patient will benefit from skilled OT to address above impairments and improve overall function.  MODIFICATION OR ASSISTANCE TO COMPLETE EVALUATION: No modification of tasks or assist necessary to complete an evaluation.  OT OCCUPATIONAL PROFILE AND HISTORY: Problem focused assessment: Including review of records relating to presenting problem.  CLINICAL DECISION MAKING: LOW - limited treatment options, no task modification necessary  REHAB POTENTIAL: Good  EVALUATION COMPLEXITY: Low    PLAN:  OT FREQUENCY: 1-2x/week  OT DURATION: 8 weeks  PLANNED INTERVENTIONS: self care/ADL training, therapeutic exercise, therapeutic activity, neuromuscular re-education, functional mobility training, electrical stimulation, ultrasound, compression bandaging, moist heat, cryotherapy, patient/family education, cognitive remediation/compensation, psychosocial skills training, energy conservation, coping strategies training, and DME and/or AE instructions  RECOMMENDED OTHER SERVICES: NA  CONSULTED AND AGREED WITH PLAN OF CARE: Patient and family member/caregiver  PLAN FOR NEXT SESSION: picking up items with tweezers/tongs, coordination, typing/computer use, dual tasking and multi-step commands for sequencing   Shakthi Scipio, OTR/L 10/16/2022, 9:34 AM

## 2022-10-18 ENCOUNTER — Ambulatory Visit: Payer: PRIVATE HEALTH INSURANCE | Admitting: Occupational Therapy

## 2022-10-18 ENCOUNTER — Ambulatory Visit: Payer: PRIVATE HEALTH INSURANCE

## 2022-10-18 DIAGNOSIS — I69353 Hemiplegia and hemiparesis following cerebral infarction affecting right non-dominant side: Secondary | ICD-10-CM

## 2022-10-18 DIAGNOSIS — R41840 Attention and concentration deficit: Secondary | ICD-10-CM

## 2022-10-18 DIAGNOSIS — R208 Other disturbances of skin sensation: Secondary | ICD-10-CM

## 2022-10-18 DIAGNOSIS — R278 Other lack of coordination: Secondary | ICD-10-CM

## 2022-10-18 DIAGNOSIS — R4701 Aphasia: Secondary | ICD-10-CM

## 2022-10-18 LAB — FACTOR 5 LEIDEN

## 2022-10-18 NOTE — Therapy (Signed)
OUTPATIENT OCCUPATIONAL THERAPY NEURO  Treatment Note  Patient Name: Cameron Hudson MRN: [id number] DOB:[birth year], 57 y.o., male Today's Date: 10/18/2022  PCP: Creola Corn, MD REFERRING PROVIDER: Creola Corn, MD  END OF SESSION:  OT End of Session - 10/18/22 0936     Visit Number 9    Number of Visits 17    Date for OT Re-Evaluation 11/10/22    Authorization Type [employer] / [employer] [id number]    Authorization - Number of Visits 20   OT/PT combined   OT Start Time 0933    OT Stop Time 1015    OT Time Calculation (min) 42 min                    Past Medical History:  Diagnosis Date   Allergy    Asthma    CAD S/P percutaneous coronary angioplasty 06/20/2017   Inferior STEMI - 100% dRCA --> DES PCI with Synergy 3.0 x 24 (3.3 mm). Mild 10-30% disease in p-mRCA, Cx & LAD.  EF Normal.    Hemorrhoids    Hx of adenomatous polyp of colon 08/01/2019   Hyperlipidemia    IBS (irritable bowel syndrome)    Personal history of colonic polyps 2004   12 mm adenoma   Seasonal allergies    STEMI involving oth coronary artery of inferior wall 06/20/2017   12/18 PCI/DESx1 to dRCA, normal EF   Past Surgical History:  Procedure Laterality Date   CHOLECYSTECTOMY N/A 11/28/2019   Procedure: LAPAROSCOPIC CHOLECYSTECTOMY;  Surgeon: Abigail Miyamoto, MD;  Location: [facility];  Service: General;  Laterality: N/A;   COLONOSCOPY     COLONOSCOPY W/ POLYPECTOMY  08/27/2002; 08/30/2005; 01/06/2011   12mm adenoma, diverticulosis, hemorrhoids; normal;  diverticulosis and small hemorrhoids   CORONARY STENT INTERVENTION N/A 06/20/2017   Procedure: CORONARY STENT INTERVENTION;  Surgeon: Lennette Bihari, MD;  Location: [facility];  Service: Cardiovascular;  Laterality: N/A;   LEFT HEART CATH AND CORONARY ANGIOGRAPHY N/A 06/20/2017   Procedure: LEFT HEART CATH AND CORONARY ANGIOGRAPHY;  Surgeon: Lennette Bihari, MD;  Location: [facility];  Service: Cardiovascular;   Laterality: N/A;   NASAL SINUS SURGERY  Dec 2011   POLYPECTOMY     Patient Active Problem List   Diagnosis Date Noted   Hx of adenomatous polyp of colon 08/01/2019   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   IFG (impaired fasting glucose) 08/11/2017   Hypogonadism, male 08/11/2017   Statin intolerance 08/09/2017   Encounter for long-term (current) use of medications 08/09/2017   Hyperlipidemia with target low density lipoprotein (LDL) cholesterol less than 70 mg/dL 78/29/5621   STEMI involving oth coronary artery of inferior wall 06/20/2017   CAD S/P percutaneous coronary angioplasty 06/20/2017   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   Acute pain of right shoulder 10/07/2016   Radicular pain 08/09/2016   Seasonal allergies 07/31/2013   History of chronic sinusitis 05/11/2012   Allergy 05/11/2012   Asthma, exercise induced 10/09/2010   Chronic sinusitis 10/09/2010   Perennial allergic rhinitis with seasonal variation 10/09/2010    ONSET DATE: 09/06/22  REFERRING DIAG: I63.9 (ICD-10-CM) - CVA (cerebral vascular accident)  THERAPY DIAG:  Hemiplegia and hemiparesis following cerebral infarction affecting right non-dominant side  Other disturbances of skin sensation  Other lack of coordination  Attention and concentration deficit  Rationale for Evaluation and Treatment: Rehabilitation  SUBJECTIVE:   SUBJECTIVE STATEMENT: Pt reports that his Right hand has been hurting since Monday.  He also reports  that he feels stiff as he feels like he is constantly riding in car and going to appointments.   Pt accompanied by: self and significant other (spouse [name])  PERTINENT HISTORY: Coronary artery disease, s/p inf STEMI and DES to occluded RCA in 05/2017  PRECAUTIONS: None  WEIGHT BEARING RESTRICTIONS: No  PAIN:  Are you having pain? No  FALLS: Has patient fallen in last 6 months? Yes. Number of falls 1 - collapsed with onset of CVA  LIVING ENVIRONMENT: Lives with: lives with  their family (53 and 74 yo daughters) Lives in: House/apartment Stairs: Yes: Internal: able to remain on main level will full bedroom/bathroom, does have 2nd floor - children's bedrooms are upstairs steps; on right going up and External: 4-5 steps; on right going up Has following equipment at home: None  PLOF: Independent and Independent with basic ADLs, sees pt's 2 days per week and admin 1.5 days  PATIENT GOALS: to make sure that I don't need OT  OBJECTIVE:   HAND DOMINANCE: Left  ADLs: Overall ADLs: Independent with bathing/dressing and bathroom transfers, no difficulty with clothing fasteners Transfers/ambulation related to ADLs: Independent Equipment: none  IADLs: Pt just returned home from hospital yesterday, has not yet attempted any IADLs.  Pt reports that he feels he could do them, but has not had the opportunity to attempt. Medication management: Spouse is currently assisting with dispensing medications  Financial management: Did go in to the clinic to do some payroll with supervision Handwriting: 100% legible  MOBILITY STATUS: Independent  POSTURE COMMENTS:  No Significant postural limitations  ACTIVITY TOLERANCE: Activity tolerance: WFL for tasks assessed on eval  FUNCTIONAL OUTCOME MEASURES: FOTO: 91, predicted 98 at d/c  UPPER EXTREMITY ROM:  WFL bilaterally   UPPER EXTREMITY MMT:   Grossly 5/5 bilaterally  HAND FUNCTION: Grip strength: Right: 90 lbs; Left: 100 lbs, Lateral pinch: Right: 23 lbs, Left: 21 lbs, and 3 point pinch: Right: 20 lbs, Left: 17 lbs  COORDINATION: Finger Nose Finger test: R mild decreased coordination and speed compared to L 9 Hole Peg test: Right: 28.78 sec; Left: 20.10 sec  SENSATION: Slight decreased sensation in R hand, along ulnar distribution  COGNITION: Overall cognitive status: Within functional limits for tasks assessed and requires slowing with verbal cues to allow for increased time to process. 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  Pt with no issues with balance and reach when retrieving items with RUE.  However pt with significant challenge with verbal problem solving task due to aphasia.   Complex planning: completed complex planning with planning appointments and errands which pt requiring initial cues to read entire passage to gather all necessary information.  Pt omitting certain aspects/errands requiring cues to redirect to passage to recall omitted information. Discussed carryover to work scheduling and having to work around cancels, late arrivals, Catering manager.  Discussed many tasks having a "framework" and then needing to modify them based on specific task, person, or situation.  Work related task: pt demonstrating good strength and coordination to bend wire with pliers as needed to complete job related tasks.  Pt reports feeling that he has good control and strength as needed, but is concerned it may still take a little more time/effort than previously.  Discussed modifications to positioning to allow for increased ease and success.   10/16/22 Coordination:  small peg board: completed small peg board pattern replication with RUE while engaging in dual tasking conversation about his schooling and how he met his spouse.  Pt demonstrating mild-mod difficulty with dual tasking, occasionally having to stop to either answer question of focus on coordination task.  Pt challenged to remove pegs and translate fingers > palm > fingers tips to place in container to further challenge [facility]. [place] - replicating pattern  with various sized piece with focus on picking up and rotating.  Pt with increased difficulty with multi-tasking, transitioning to using dominant LUE.  OT educated on functional carryover of multi-tasking/dual tasking and typical ability to phase in and out as needed but having to focus on it more right now.    10/11/22 Coordination: Animal upon animal: engaged in Landscape architect game with RUE with focus on picking up animal pieces and rotating to place onto stack with focus on grasp and coordination.  Pt demonstrating mild shakiness with increased challenge for precision. Small dowels: engaged in picking up "pick up sticks" with focus on [facility] and pinch to pick up with focus on precision and motor control.  Then attempting to place stick through slots with focus on [facility] with pinch and wrist movements to challenge motor control.  Pt with intermittent difficulty with picking up do to decreased sensation and mild shakiness with precision pinch.   Multi-tasking: Engaged in conversation during above coordination tasks to challenge cognitive dual tasking.  Pt demonstrating decreased attention to task and coordination when attempting to divide attention to engage in discussion as well.  This is compounded due to aphasia requiring increased time to identify and/or write appropriate words.  Pt frequently stopping coordination task to engage in discussion.    PATIENT EDUCATION: Education details: ongoing condition specific education Person educated: Patient and Spouse Education method: Explanation Education comprehension: verbalized understanding  HOME EXERCISE PROGRAM: Access Code: [email or web address] URL: [email or web address] Date: 09/28/2022 Prepared by: [facility] - Outpatient  Rehab - [facility]  Exercises - Putty Squeezes  - 1 x daily - 10 reps - Thumb Opposition with Putty  - 1 x daily - 10 reps - 3-Point Pinch with Putty  - 1 x daily - 10 reps - Finger Lumbricals with Putty  - 1 x  daily - 10 reps - Marble Pick Up from Putty  - 1 x daily - 10 reps   GOALS: Goals reviewed with patient? Yes  SHORT TERM GOALS: Target date: 10/13/22  Pt will be Independent with coordination HEP to increase independence with ADLs/IADLs Baseline: Goal status: MET - 10/11/22  2.  Pt will complete table top scanning activity without cues, demonstrating improved sustained attention to task. Baseline:  Goal status: MET - 10/11/22  3.  Pt will complete dual task activity in mild distracting environment with 90% accuracy. Baseline:  Goal status: IN PROGRESS   LONG TERM GOALS: Target date: 11/10/22  Pt will demonstrate improved fine motor coordination for ADLs as evidenced by decreasing 9 hole peg test score for RUE  by 5 secs Baseline: Right: 28.78 sec; Left: 20.10 sec Goal status: IN PROGRESS  2.  Pt will be able to engage in dual task activity with 100% accuracy to demonstrate increase awareness, organization, and sequencing. Baseline:  Goal status: IN PROGRESS  3.  Pt will complete a moderately challenging bill paying/computer task <2 errors due to aphasia. Baseline:  Goal status: IN PROGRESS  4.  Pt will complete a 3 step task to carryover to IADL/work tasks with <1 cue for recall/sequencing. Baseline:  Goal status: IN PROGRESS   ASSESSMENT:  CLINICAL IMPRESSION: Pt demonstrating tremor in RUE during [facility] tasks with pt reports concerns to carry over to work related tasks.  Pt also with increased pain in hand, however did report increased weight use in gym on Monday, encouraged pt to continue to monitor and contact MD if it worsens or does not get better.  Pt demonstrating difficulty with multi-tasking due to aphasia more than cognition, requiring stopping to gain assist to come up with particular words.  PERFORMANCE DEFICITS: in functional skills including IADLs, coordination, sensation, Fine motor control, Gross motor control, endurance, and UE functional use, cognitive skills  including attention, memory, orientation, problem solving, safety awareness, sequencing, thought, and understand, and psychosocial skills including environmental adaptation, habits, and routines and behaviors.   IMPAIRMENTS: are limiting patient from IADLs, work, leisure, and social participation.   CO-MORBIDITIES: may have co-morbidities  that affects occupational performance. Patient will benefit from skilled OT to address above impairments and improve overall function.  MODIFICATION OR ASSISTANCE TO COMPLETE EVALUATION: No modification of tasks or assist necessary to complete an evaluation.  OT OCCUPATIONAL PROFILE AND HISTORY: Problem focused assessment: Including review of records relating to presenting problem.  CLINICAL DECISION MAKING: LOW - limited treatment options, no task modification necessary  REHAB POTENTIAL: Good  EVALUATION COMPLEXITY: Low    PLAN:  OT FREQUENCY: 1-2x/week  OT DURATION: 8 weeks  PLANNED INTERVENTIONS: self care/ADL training, therapeutic exercise, therapeutic activity, neuromuscular re-education, functional mobility training, electrical stimulation, ultrasound, compression bandaging, moist heat, cryotherapy, patient/family education, cognitive remediation/compensation, psychosocial skills training, energy conservation, coping strategies training, and DME and/or AE instructions  RECOMMENDED OTHER SERVICES: NA  CONSULTED AND AGREED WITH PLAN OF CARE: Patient and family member/caregiver  PLAN FOR NEXT SESSION: picking up items with tweezers/tongs, coordination, typing/computer use, dual tasking and multi-step commands for sequencing, incorporate more gross motor and/or weight stack    Sherese Heyward, OTR/L 10/18/2022, 9:36 AM

## 2022-10-18 NOTE — Therapy (Signed)
OUTPATIENT SPEECH LANGUAGE PATHOLOGY APHASIA TREATMENT   Patient Name: Cameron Hudson MRN: [id number] DOB:23-Mar-1966, 57 y.o., male Today's Date: 10/19/2022  PCP: Creola Corn, MD REFERRING PROVIDER: [facility] MD Timothy Lasso- Doc)  END OF SESSION:  End of Session - 10/19/22 0032     Visit Number 9    Number of Visits 17    Date for SLP Re-Evaluation 11/22/22    Authorization Time Period ST= 20 visits (per case mgr)    Authorization - Visit Number 9    Authorization - Number of Visits 20    SLP Start Time 1021    SLP Stop Time  1101    SLP Time Calculation (min) 40 min    Activity Tolerance Patient tolerated treatment well                  Past Medical History:  Diagnosis Date   Allergy    Asthma    CAD S/P percutaneous coronary angioplasty 06/20/2017   Inferior STEMI - 100% dRCA --> DES PCI with Synergy 3.0 x 24 (3.3 mm). Mild 10-30% disease in p-mRCA, Cx & LAD.  EF Normal.    Hemorrhoids    Hx of adenomatous polyp of colon 08/01/2019   Hyperlipidemia    IBS (irritable bowel syndrome)    Personal history of colonic polyps 2004   12 mm adenoma   Seasonal allergies    STEMI involving oth coronary artery of inferior wall 06/20/2017   12/18 PCI/DESx1 to dRCA, normal EF   Past Surgical History:  Procedure Laterality Date   CHOLECYSTECTOMY N/A 11/28/2019   Procedure: LAPAROSCOPIC CHOLECYSTECTOMY;  Surgeon: Abigail Miyamoto, MD;  Location: [facility];  Service: General;  Laterality: N/A;   COLONOSCOPY     COLONOSCOPY W/ POLYPECTOMY  08/27/2002; 08/30/2005; 01/06/2011   12mm adenoma, diverticulosis, hemorrhoids; normal;  diverticulosis and small hemorrhoids   CORONARY STENT INTERVENTION N/A 06/20/2017   Procedure: CORONARY STENT INTERVENTION;  Surgeon: Lennette Bihari, MD;  Location: [facility];  Service: Cardiovascular;  Laterality: N/A;   LEFT HEART CATH AND CORONARY ANGIOGRAPHY N/A 06/20/2017   Procedure: LEFT HEART CATH AND CORONARY  ANGIOGRAPHY;  Surgeon: Lennette Bihari, MD;  Location: [facility];  Service: Cardiovascular;  Laterality: N/A;   NASAL SINUS SURGERY  Dec 2011   POLYPECTOMY     Patient Active Problem List   Diagnosis Date Noted   Hx of adenomatous polyp of colon 08/01/2019   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   IFG (impaired fasting glucose) 08/11/2017   Hypogonadism, male 08/11/2017   Statin intolerance 08/09/2017   Encounter for long-term (current) use of medications 08/09/2017   Hyperlipidemia with target low density lipoprotein (LDL) cholesterol less than 70 mg/dL 40/98/1191   STEMI involving oth coronary artery of inferior wall 06/20/2017   CAD S/P percutaneous coronary angioplasty 06/20/2017   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   Acute pain of right shoulder 10/07/2016   Radicular pain 08/09/2016   Seasonal allergies 07/31/2013   History of chronic sinusitis 05/11/2012   Allergy 05/11/2012   Asthma, exercise induced 10/09/2010   Chronic sinusitis 10/09/2010   Perennial allergic rhinitis with seasonal variation 10/09/2010    ONSET DATE: 09/06/22   REFERRING DIAG: Aphasia  THERAPY DIAG: Aphasia  Rationale for Evaluation and Treatment: Rehabilitation  SUBJECTIVE:   SUBJECTIVE STATEMENT: "Think shadow said - I would be experiencing (pointing to hand) but - - it - is nothing." Maralyn Sago OT thought pain in hand might have been from pt  overusing) Pt accompanied by: significant other wife-Jill  PERTINENT HISTORY: HLD, CAD,S/P one stent  PAIN: Are you having pain? No  FALLS: Has patient fallen in last 6 months?  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   PATIENT REPORTED OUTCOME MEASURES (PROM): Communication Effectiveness Survey: (CES) :Pt filled out CES  and scored himself 13/32 (higher numbers indicate better QOL/effective communication). "1" were scored for  participating in conversation with strangers in a quiet place, conversing with a stranger over the telephone, conversing in a noisy environment, and having a conversation with someone at a distance. [name] scored pt 10/32, with "1" for participating in a conversation with strangers in a quiet place, with a familiar person over the telephone, with a stranger over the telephone, in a noisy environment, with a friend when emotionally upset or angry, and having a conversation with someone at a distance.. Pt's higher score likely indicates his awareness of errors is not accurate due to his aphasia.   TODAY'S TREATMENT:                                                                                                                                         10/18/22: SLP focused on practice today with expressive speech with using compensations like phone (map and photos apps). Pt's message was 98% functionally comprehended by SLP.  Stressed to pt/wife to have the words [name] knows Louie desires to say provided with first 2 phonemes by [name] and then increasing as necessary the sounds spoken to him. Then pt should write and say these words 5x - he did so today with "group project" and after 45 seconds pt told SLP perfectly.   10/11/22: SLP targeted salient and meaningful language with [employer] today, engaging in simple conversation with some rare mod complex conversation which again was functional with using written cues. SLP cont'd to then return to error words and assist pt production - success 25% independently and this incr'd to 50% with mod/max A. Simple divergent naming task with basic category (colors) completed with pt generating 7 colors independently without any aphasic errors. He req'd mod A to generate 4 more and needed max A for "teal" which pt initiated and used compensatory strategy of description saying, "[facility] color".   10/04/22: Pt functionally told SLP about the weekend with rare min questioning cues from SLP,  and second attempts by pt. SLP targeted functional therapy in a conversational manner - this was fostered by pt writing words he could not articulate correctly and SLP practicing those words with pt. SLP cont to model this type of practice for [name] - as pt's schedule has ~9 hours of drive time/week that he practices with [facility]. SLP cont to encourage conversational practice at times other than car time. SLP reiterated strongly that pt must practice the  target words afterwards for maximum benefit for practice. Pt wife asks SLP about strategies for pronoun expression. SLP encouraged pt to slow his rate of speech and told wife/pt that pronouns may well be one thing that pt will need to pay special attention to from this point onward with his spontaneous speech.  09/28/22: Pt used written augmentation extensively today in 15-minutes simple to mod complex conversation with SLP and was nearly functional with SLP semantic and articulatory cues. SLP educated wife how to use semantic cues with pt today via model and direct instruction. Pt cont to work with constant therapy at home 40-70 minutes/day.  09/27/22:SLP provided CES today for both pt and [name]; Results above. SLP targeted functional language with salient and high interest topics in simple conversation. Pt was nearly functional but req'd SLP questioning cues rarely. Pt augmented verbal communication with written communication on a pad successfully 90% of the time. Pt's spontaneous speech is more grammatically correct and more prevalent today than in previous sessions.   SLP then linked clinician CT to pt's CT profile.   09/20/22: SLP targeted expressive and receptive language. SLP modeled practice for home tasks and provided wife with suggested cues for target word. Pt benefited today from semantic cues. Pt filled in 2 letters for body parts with 4/6 success and incr'd to 6/6 with min A. Written cues from pt did not appear to assist verbal expression.  Neologism and semantic errors prevalent, with incr'd awareness than Monday.  09/18/22:QAB completed today. Score above. Pt with less frustration today - self-calming behavior, "It's ok it's ok" x4. SLP provided education on [facility] telling pt/wife that pt could work on it if frustration level does not inhibit ability to complete. Homework to say family names, other automatic speech, and cloze phrases/sentences.  SLP provided clinic number for [facility] and suggested wife call to obtain information about possible summer sessions.  09/14/22: Expressed frustration re: hwk for sentence completions. Demo'd good comprehension of task, correct completion of sentences, and accurate writing; however, usual verbal reading errors noted, particularly content words. Reduced awareness of errors demonstrated initially, which improved throughout session. SLP educated rationale of task, including task hierarchy (starting with easier, more automatic tasks and then advancing) and targeted deficits (comprehension and expression). Writing noted as relative strength with pt often writing key words to optimize listener comprehension given usual neologisms and paraphasias. SLP targeted verbal expression of personally relevant words from his personal notebook. SLP demonstrated recommended cueing hierarchy to maximize patient performance. Pt benefited from usual verbal/visual modeling fading to visual modeling (mouthing sounds) to saying word in response to a question to saying word independently with intermittent choral practice needed. See patient instructions for additional education and HEP. Briefly discussed auditory comprehension strategies (slower rate, repetition, rephrasing, confirmation); additional education warranted.   PATIENT EDUCATION: Education details: see above Person educated: Patient and Spouse Education method: Explanation, Demonstration, Verbal cues, and Handouts Education comprehension: verbalized  understanding, verbal cues required, and needs further education   GOALS: Goals reviewed with patient? Yes  SHORT TERM GOALS: Target date: 10/15/22  Pt will name average 8 items in salient, practical categories in 2 sessions Baseline: 10/11/22 Goal status: IN PROGRESS  2.  Pt will communicate basic needs/wants with use of compensatory strategies in 3 sessions Baseline: 10/04/22, 10/11/22 Goal status: IN PROGRESS  3.  Pt will demo understanding of sentence stimuli in structured tasks in 3 sessions Baseline: 10/04/22, 10/11/22 Goal status: IN PROGRESS  4.  With extra time, pt will  demo understanding of 2-spoken sentence stimuli in 3 sessions Baseline:  Goal status: IN PROGRESS   LONG TERM GOALS: Target date: 11/22/22  Pt will name average 8 items in simple categories (non-personal for pt) in 3 sessions Baseline:  Goal status: IN PROGRESS  2.  Pt will produce MLU of >6.5 average in 3-minute speech sample  Baseline:  Goal status: IN PROGRESS  3.  Pt family will demo appropriate cueing for pt's receptive and expressive language in 5 sessions Baseline:  Goal status: IN PROGRESS  4.  Pt will score higher on PROM than on initial reading in first 2 sessions Baseline:  Goal status: IN PROGRESS   ASSESSMENT:  CLINICAL IMPRESSION: Patient is a 57 y.o. male who was seen today for treatment of expressive and receptive language. Verbal expression cont to contain neologism and phonemic paraphasias. 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  OBJECTIVE IMPAIRMENTS: include expressive language, receptive language, aphasia, and dysphagia. These impairments are limiting patient from return to work, household responsibilities, ADLs/IADLs, and effectively communicating at home and in community. Factors affecting potential to achieve goals and functional outcome are severity of impairments and limited insurance coverage for ST . Patient will benefit from  skilled SLP services to address above impairments and improve overall function.  REHAB POTENTIAL: Good  PLAN:  SLP FREQUENCY: 1-2x/week  SLP DURATION:  20 sessions  PLANNED INTERVENTIONS: Language facilitation, Environmental controls, Trials of upgraded texture/liquids, Cueing hierachy, Internal/external aids, Functional tasks, Multimodal communication approach, SLP instruction and feedback, Compensatory strategies, and Patient/family education    [facility], CCC-SLP 10/19/2022, 12:32 AM

## 2022-10-23 ENCOUNTER — Ambulatory Visit: Payer: PRIVATE HEALTH INSURANCE | Admitting: Occupational Therapy

## 2022-10-25 ENCOUNTER — Ambulatory Visit: Payer: PRIVATE HEALTH INSURANCE

## 2022-10-25 ENCOUNTER — Ambulatory Visit: Payer: PRIVATE HEALTH INSURANCE | Attending: Internal Medicine | Admitting: Occupational Therapy

## 2022-10-25 DIAGNOSIS — R41840 Attention and concentration deficit: Secondary | ICD-10-CM | POA: Diagnosis present

## 2022-10-25 DIAGNOSIS — I69353 Hemiplegia and hemiparesis following cerebral infarction affecting right non-dominant side: Secondary | ICD-10-CM | POA: Diagnosis present

## 2022-10-25 DIAGNOSIS — R4701 Aphasia: Secondary | ICD-10-CM

## 2022-10-25 DIAGNOSIS — R278 Other lack of coordination: Secondary | ICD-10-CM | POA: Diagnosis present

## 2022-10-25 DIAGNOSIS — R208 Other disturbances of skin sensation: Secondary | ICD-10-CM | POA: Insufficient documentation

## 2022-10-25 NOTE — Therapy (Signed)
OUTPATIENT SPEECH LANGUAGE PATHOLOGY APHASIA TREATMENT   Patient Name: Cameron Hudson MRN: [id number] DOB:Apr 16, 1966, 57 y.o., male Today's Date: 10/25/2022  PCP: Creola Corn, MD REFERRING PROVIDER: [facility] MD Timothy Lasso- Doc)  END OF SESSION:  End of Session - 10/25/22 1027     Visit Number 10    Number of Visits 17    Date for SLP Re-Evaluation 11/22/22    Authorization Time Period ST= 20 visits (per case mgr)    Authorization - Visit Number 10    Authorization - Number of Visits 20    SLP Start Time 1024    SLP Stop Time  1102    SLP Time Calculation (min) 38 min    Activity Tolerance Patient tolerated treatment well                  Past Medical History:  Diagnosis Date   Allergy    Asthma    CAD S/P percutaneous coronary angioplasty 06/20/2017   Inferior STEMI - 100% dRCA --> DES PCI with Synergy 3.0 x 24 (3.3 mm). Mild 10-30% disease in p-mRCA, Cx & LAD.  EF Normal.    Hemorrhoids    Hx of adenomatous polyp of colon 08/01/2019   Hyperlipidemia    IBS (irritable bowel syndrome)    Personal history of colonic polyps 2004   12 mm adenoma   Seasonal allergies    STEMI involving oth coronary artery of inferior wall (HCC) 06/20/2017   12/18 PCI/DESx1 to dRCA, normal EF   Past Surgical History:  Procedure Laterality Date   CHOLECYSTECTOMY N/A 11/28/2019   Procedure: LAPAROSCOPIC CHOLECYSTECTOMY;  Surgeon: Abigail Miyamoto, MD;  Location: [facility];  Service: General;  Laterality: N/A;   COLONOSCOPY     COLONOSCOPY W/ POLYPECTOMY  08/27/2002; 08/30/2005; 01/06/2011   12mm adenoma, diverticulosis, hemorrhoids; normal;  diverticulosis and small hemorrhoids   CORONARY STENT INTERVENTION N/A 06/20/2017   Procedure: CORONARY STENT INTERVENTION;  Surgeon: Lennette Bihari, MD;  Location: [facility];  Service: Cardiovascular;  Laterality: N/A;   LEFT HEART CATH AND CORONARY ANGIOGRAPHY N/A 06/20/2017   Procedure: LEFT HEART CATH AND CORONARY  ANGIOGRAPHY;  Surgeon: Lennette Bihari, MD;  Location: [facility];  Service: Cardiovascular;  Laterality: N/A;   NASAL SINUS SURGERY  Dec 2011   POLYPECTOMY     Patient Active Problem List   Diagnosis Date Noted   Hx of adenomatous polyp of colon 08/01/2019   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   IFG (impaired fasting glucose) 08/11/2017   Hypogonadism, male 08/11/2017   Statin intolerance 08/09/2017   Encounter for long-term (current) use of medications 08/09/2017   Hyperlipidemia with target low density lipoprotein (LDL) cholesterol less than 70 mg/dL 40/98/1191   STEMI involving oth coronary artery of inferior wall (HCC) 06/20/2017   CAD S/P percutaneous coronary angioplasty 06/20/2017   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 Better."  Pt accompanied by: significant other wife-Jill  PERTINENT HISTORY: HLD, CAD,S/P one stent  PAIN: Are you  having pain? No  FALLS: Has patient fallen in last 6 months?  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   PATIENT REPORTED OUTCOME MEASURES (PROM): Communication Effectiveness Survey: (CES) :Pt filled out CES  and scored himself 13/32 (higher numbers indicate better QOL/effective communication). "1" were scored for participating in conversation with strangers in a quiet place, conversing with a stranger over the telephone,  conversing in a noisy environment, and having a conversation with someone at a distance. Cameron Hudson scored pt 10/32, with "1" for participating in a conversation with strangers in a quiet place, with a familiar person over the telephone, with a stranger over the telephone, in a noisy environment, with a friend when emotionally upset or angry, and having a conversation with someone at a distance.. Pt's higher score likely indicates his awareness of errors is not accurate due to his aphasia.   TODAY'S TREATMENT:                                                                                                                                         10/25/22: Pt asked about writing skills and SLP had pt show SLP his texts. SLP copied some tsks for pt and explained them to pt/wife. Writing has agrammatism, largely. Homework is to read proper English sentences and note the differences.  10/18/22: SLP focused on practice today with expressive speech with using compensations like phone (map and photos apps). Pt's message was 98% functionally comprehended by SLP.  Stressed to pt/wife to have the words Cameron Hudson knows Kharter desires to say provided with first 2 phonemes by Cameron Hudson and then increasing as necessary the sounds spoken to him. Then pt should write and say these words 5x - he did so today with "group project" and after 45 seconds pt told SLP perfectly.   10/11/22: SLP targeted salient and meaningful language with Nero today, engaging in simple conversation with some rare mod complex conversation which again was functional with using written cues. SLP cont'd to then return to error words and assist pt production - success 25% independently and this incr'd to 50% with mod/max A. Simple divergent naming task with basic category (colors) completed with pt generating 7 colors independently without any aphasic errors. He req'd mod A to generate 4 more and needed max A for "teal" which pt initiated and used compensatory strategy of  description saying, "[employer] color".   10/04/22: Pt functionally told SLP about the weekend with rare min questioning cues from SLP, and second attempts by pt. SLP targeted functional therapy in a conversational manner - this was fostered by pt writing words he could not articulate correctly and SLP practicing those words with pt. SLP cont to model this type of practice for Cameron Hudson - as pt's schedule has ~9 hours of drive time/week that he practices  with [facility]. SLP cont to encourage conversational practice at times other than car time. SLP reiterated strongly that pt must practice the target words afterwards for maximum benefit for practice. Pt wife asks SLP about strategies for pronoun expression. SLP encouraged pt to slow his rate of speech and told wife/pt that pronouns may well be one thing that pt will need to pay special attention to from this point onward with his spontaneous speech.  09/28/22: Pt used written augmentation extensively today in 15-minutes simple to mod complex conversation with SLP and was nearly functional with SLP semantic and articulatory cues. SLP educated wife how to use semantic cues with pt today via model and direct instruction. Pt cont to work with constant therapy at home 40-70 minutes/day.  09/27/22:SLP provided CES today for both pt and Cameron Hudson; Results above. SLP targeted functional language with salient and high interest topics in simple conversation. Pt was nearly functional but req'd SLP questioning cues rarely. Pt augmented verbal communication with written communication on a pad successfully 90% of the time. Pt's spontaneous speech is more grammatically correct and more prevalent today than in previous sessions.   SLP then linked clinician CT to pt's CT profile.   09/20/22: SLP targeted expressive and receptive language. SLP modeled practice for home tasks and provided wife with suggested cues for target word. Pt benefited today from semantic cues. Pt filled in 2  letters for body parts with 4/6 success and incr'd to 6/6 with min A. Written cues from pt did not appear to assist verbal expression. Neologism and semantic errors prevalent, with incr'd awareness than Monday.  09/18/22:QAB completed today. Score above. Pt with less frustration today - self-calming behavior, "It's ok it's ok" x4. SLP provided education on [facility] telling pt/wife that pt could work on it if frustration level does not inhibit ability to complete. Homework to say family names, other automatic speech, and cloze phrases/sentences.  SLP provided clinic number for [facility] and suggested wife call to obtain information about possible summer sessions.  09/14/22: Expressed frustration re: hwk for sentence completions. Demo'd good comprehension of task, correct completion of sentences, and accurate writing; however, usual verbal reading errors noted, particularly content words. Reduced awareness of errors demonstrated initially, which improved throughout session. SLP educated rationale of task, including task hierarchy (starting with easier, more automatic tasks and then advancing) and targeted deficits (comprehension and expression). Writing noted as relative strength with pt often writing key words to optimize listener comprehension given usual neologisms and paraphasias. SLP targeted verbal expression of personally relevant words from his personal notebook. SLP demonstrated recommended cueing hierarchy to maximize patient performance. Pt benefited from usual verbal/visual modeling fading to visual modeling (mouthing sounds) to saying word in response to a question to saying word independently with intermittent choral practice needed. See patient instructions for additional education and HEP. Briefly discussed auditory comprehension strategies (slower rate, repetition, rephrasing, confirmation); additional education warranted.   PATIENT EDUCATION: Education details: see above Person educated:  Patient and Spouse Education method: Explanation, Demonstration, Verbal cues, and Handouts Education comprehension: verbalized understanding, verbal cues required, and needs further education   GOALS: Goals reviewed with patient? Yes  SHORT TERM GOALS: Target date: 10/15/22  Pt will name average 8 items in salient, practical categories in 2 sessions Baseline: 10/11/22 Goal status: IN PROGRESS  2.  Pt will communicate basic needs/wants with use of compensatory strategies in 3 sessions Baseline: 10/04/22, 10/11/22 Goal status: IN PROGRESS  3.  Pt will demo understanding of  sentence stimuli in structured tasks in 3 sessions Baseline: 10/04/22, 10/11/22 Goal status: IN PROGRESS  4.  With extra time, pt will demo understanding of 2-spoken sentence stimuli in 3 sessions Baseline:  Goal status: IN PROGRESS   LONG TERM GOALS: Target date: 11/22/22  Pt will name average 8 items in simple categories (non-personal for pt) in 3 sessions Baseline:  Goal status: IN PROGRESS  2.  Pt will produce MLU of >6.5 average in 3-minute speech sample  Baseline:  Goal status: IN PROGRESS  3.  Pt family will demo appropriate cueing for pt's receptive and expressive language in 5 sessions Baseline:  Goal status: IN PROGRESS  4.  Pt will score higher on PROM than on initial reading in first 2 sessions Baseline:  Goal status: IN PROGRESS   ASSESSMENT:  CLINICAL IMPRESSION: Patient is a 57 y.o. male who was seen today for treatment of expressive and receptive language. Verbal expression cont to contain neologism and phonemic paraphasias. 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  OBJECTIVE IMPAIRMENTS: include expressive language, receptive language, aphasia, and dysphagia. These impairments are limiting patient from return to work, household responsibilities, ADLs/IADLs, and effectively communicating at home and in community. Factors affecting potential to  achieve goals and functional outcome are severity of impairments and limited insurance coverage for ST . Patient will benefit from skilled SLP services to address above impairments and improve overall function.  REHAB POTENTIAL: Good  PLAN:  SLP FREQUENCY: 1-2x/week  SLP DURATION:  20 sessions  PLANNED INTERVENTIONS: Language facilitation, Environmental controls, Trials of upgraded texture/liquids, Cueing hierachy, Internal/external aids, Functional tasks, Multimodal communication approach, SLP instruction and feedback, Compensatory strategies, and Patient/family education    [facility], CCC-SLP 10/25/2022, 10:28 AM

## 2022-10-25 NOTE — Therapy (Signed)
OUTPATIENT OCCUPATIONAL THERAPY NEURO  Treatment Note  Patient Name: Cameron Hudson MRN: [id number] DOB:03/31/1966, 57 y.o., male Today's Date: 10/25/2022  PCP: Creola Corn, MD REFERRING PROVIDER: Creola Corn, MD  END OF SESSION:  OT End of Session - 10/25/22 1023     Visit Number 10    Number of Visits 17    Date for OT Re-Evaluation 11/10/22    Authorization Type [employer] / [employer] [id number]    Authorization - Number of Visits 20   OT/PT combined   OT Start Time 0932    OT Stop Time 1015    OT Time Calculation (min) 43 min                     Past Medical History:  Diagnosis Date   Allergy    Asthma    CAD S/P percutaneous coronary angioplasty 06/20/2017   Inferior STEMI - 100% dRCA --> DES PCI with Synergy 3.0 x 24 (3.3 mm). Mild 10-30% disease in p-mRCA, Cx & LAD.  EF Normal.    Hemorrhoids    Hx of adenomatous polyp of colon 08/01/2019   Hyperlipidemia    IBS (irritable bowel syndrome)    Personal history of colonic polyps 2004   12 mm adenoma   Seasonal allergies    STEMI involving oth coronary artery of inferior wall (HCC) 06/20/2017   12/18 PCI/DESx1 to dRCA, normal EF   Past Surgical History:  Procedure Laterality Date   CHOLECYSTECTOMY N/A 11/28/2019   Procedure: LAPAROSCOPIC CHOLECYSTECTOMY;  Surgeon: Abigail Miyamoto, MD;  Location: [facility];  Service: General;  Laterality: N/A;   COLONOSCOPY     COLONOSCOPY W/ POLYPECTOMY  08/27/2002; 08/30/2005; 01/06/2011   12mm adenoma, diverticulosis, hemorrhoids; normal;  diverticulosis and small hemorrhoids   CORONARY STENT INTERVENTION N/A 06/20/2017   Procedure: CORONARY STENT INTERVENTION;  Surgeon: Lennette Bihari, MD;  Location: [facility];  Service: Cardiovascular;  Laterality: N/A;   LEFT HEART CATH AND CORONARY ANGIOGRAPHY N/A 06/20/2017   Procedure: LEFT HEART CATH AND CORONARY ANGIOGRAPHY;  Surgeon: Lennette Bihari, MD;  Location: [facility];  Service: Cardiovascular;   Laterality: N/A;   NASAL SINUS SURGERY  Dec 2011   POLYPECTOMY     Patient Active Problem List   Diagnosis Date Noted   Hx of adenomatous polyp of colon 08/01/2019   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   IFG (impaired fasting glucose) 08/11/2017   Hypogonadism, male 08/11/2017   Statin intolerance 08/09/2017   Encounter for long-term (current) use of medications 08/09/2017   Hyperlipidemia with target low density lipoprotein (LDL) cholesterol less than 70 mg/dL 40/98/1191   STEMI involving oth coronary artery of inferior wall (HCC) 06/20/2017   CAD S/P percutaneous coronary angioplasty 06/20/2017   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   Acute pain of right shoulder 10/07/2016   Radicular pain 08/09/2016   Seasonal allergies 07/31/2013   History of chronic sinusitis 05/11/2012   Allergy 05/11/2012   Asthma, exercise induced 10/09/2010   Chronic sinusitis 10/09/2010   Perennial allergic rhinitis with seasonal variation 10/09/2010    ONSET DATE: 09/06/22  REFERRING DIAG: I63.9 (ICD-10-CM) - CVA (cerebral vascular accident)  THERAPY DIAG:  Hemiplegia and hemiparesis following cerebral infarction affecting right non-dominant side (HCC)  Other disturbances of skin sensation  Other lack of coordination  Attention and concentration deficit  Rationale for Evaluation and Treatment: Rehabilitation  SUBJECTIVE:   SUBJECTIVE STATEMENT: Pt reports that he had a massage and notices sensation is  different on his right side (arm and leg), but wonders if it has been that way since stroke or if it is new. Pt accompanied by: self and significant other (spouse [name])  PERTINENT HISTORY: Coronary artery disease, s/p inf STEMI and DES to occluded RCA in 05/2017  PRECAUTIONS: None  WEIGHT BEARING RESTRICTIONS: No  PAIN:  Are you having pain? No  FALLS: Has patient fallen in last 6 months? Yes. Number of falls 1 - collapsed with onset of CVA  LIVING ENVIRONMENT: Lives with: lives with  their family (4 and 48 yo daughters) Lives in: House/apartment Stairs: Yes: Internal: able to remain on main level will full bedroom/bathroom, does have 2nd floor - children's bedrooms are upstairs steps; on right going up and External: 4-5 steps; on right going up Has following equipment at home: None  PLOF: Independent and Independent with basic ADLs, sees pt's 2 days per week and admin 1.5 days  PATIENT GOALS: to make sure that I don't need OT  OBJECTIVE:   HAND DOMINANCE: Left  ADLs: Overall ADLs: Independent with bathing/dressing and bathroom transfers, no difficulty with clothing fasteners Transfers/ambulation related to ADLs: Independent Equipment: none  IADLs: Pt just returned home from hospital yesterday, has not yet attempted any IADLs.  Pt reports that he feels he could do them, but has not had the opportunity to attempt. Medication management: Spouse is currently assisting with dispensing medications  Financial management: Did go in to the clinic to do some payroll with supervision Handwriting: 100% legible  MOBILITY STATUS: Independent  POSTURE COMMENTS:  No Significant postural limitations  ACTIVITY TOLERANCE: Activity tolerance: WFL for tasks assessed on eval  FUNCTIONAL OUTCOME MEASURES: FOTO: 91, predicted 98 at d/c  UPPER EXTREMITY ROM:  WFL bilaterally   UPPER EXTREMITY MMT:   Grossly 5/5 bilaterally  HAND FUNCTION: Grip strength: Right: 90 lbs; Left: 100 lbs, Lateral pinch: Right: 23 lbs, Left: 21 lbs, and 3 point pinch: Right: 20 lbs, Left: 17 lbs  COORDINATION: Finger Nose Finger test: R mild decreased coordination and speed compared to L 9 Hole Peg test: Right: 28.78 sec; Left: 20.10 sec  SENSATION: Slight decreased sensation in R hand, along ulnar distribution  COGNITION: Overall cognitive status: Within functional limits for tasks assessed and requires slowing with verbal cues to allow for increased time to process. Pt with difficulty with  multi-step directions  VISION: Subjective report: no issues Baseline vision: Wears glasses for reading only  PRAXIS: Impaired: possible mild apraxia, however difficult to assess due to aphasia   TODAY'S TREATMENT:                                                              10/25/22 Alternating attention: engaged in table top task with alternating attention grid, pt able to complete without issues, however with added cognitive challenge pt unable to alternate attention between conversation and table top task. Phone/computer use: pt reports difficulty with typing and texting.  Pt showing text conversation with friend, demonstrating increased aphasia in writing than in speaking.  OT instructed pt to text with spouse (in room) to allow OT to observe coordination and language.  Pt demonstrating mild decreased coordination with R hand, demonstrating "dropping" of thumb frequently causing him to hit incorrect buttons, inappropriately.  Engaged in typing on computer,  pt still with aphasia impacting subject matter of typing, however noted decreased strength and positioning of RUE when typing.   Hand strengthening: OT instructed pt in thumb flexion and extension against theraputty and provided with rubber band to allow pt to address flexion, extension, and abduction.  Pt able to demonstrate understanding.    10/18/22 Dual tasking: attempted to engage in cognitive problem solving task during ambulation around gym to retreive cones from high and low surfaces.  Pt with no issues with balance and reach when retrieving items with RUE.  However pt with significant challenge with verbal problem solving task due to aphasia.   Complex planning: completed complex planning with planning appointments and errands which pt requiring initial cues to read entire passage to gather all necessary information.  Pt omitting certain aspects/errands requiring cues to redirect to passage to recall omitted information. Discussed  carryover to work scheduling and having to work around cancels, late arrivals, Catering manager. Discussed many tasks having a "framework" and then needing to modify them based on specific task, person, or situation.  Work related task: pt demonstrating good strength and coordination to bend wire with pliers as needed to complete job related tasks.  Pt reports feeling that he has good control and strength as needed, but is concerned it may still take a little more time/effort than previously.  Discussed modifications to positioning to allow for increased ease and success.   10/16/22 Coordination:  small peg board: completed small peg board pattern replication with RUE while engaging in dual tasking conversation about his schooling and how he met his spouse.  Pt demonstrating mild-mod difficulty with dual tasking, occasionally having to stop to either answer question of focus on coordination task.  Pt challenged to remove pegs and translate fingers > palm > fingers tips to place in container to further challenge [facility]. [place] - replicating pattern with various sized piece with focus on picking up and rotating.  Pt with increased difficulty with multi-tasking, transitioning to using dominant LUE.  OT educated on functional carryover of multi-tasking/dual tasking and typical ability to phase in and out as needed but having to focus on it more right now.   PATIENT EDUCATION: Education details: ongoing condition specific education Person educated: Patient and Spouse Education method: Explanation Education comprehension: verbalized understanding  HOME EXERCISE PROGRAM: Access Code: [email or web address] URL: [email or web address] Date: 10/25/2022 Prepared by: [facility] - [facility]  Exercises - Putty Squeezes  - 1 x daily - 10 reps - Thumb Opposition with Putty  - 1 x daily - 10 reps - 3-Point Pinch with Putty  - 1 x daily - 10 reps - Finger Lumbricals with Putty  - 1 x daily - 10  reps - Marble Pick Up from Putty  - 1 x daily - 10 reps - Thumb MCP and IP Flexion with Putty  - 1 x daily - 10 reps - Finger Extension with Putty  - 1 x daily - 10 reps   GOALS: Goals reviewed with patient? Yes  SHORT TERM GOALS: Target date: 10/13/22  Pt will be Independent with coordination HEP to increase independence with ADLs/IADLs Baseline: Goal status: MET - 10/11/22  2.  Pt will complete table top scanning activity without cues, demonstrating improved sustained attention to task. Baseline:  Goal status: MET - 10/11/22  3.  Pt will complete dual task activity in mild distracting environment with 90% accuracy. Baseline:  Goal status: IN PROGRESS   LONG TERM GOALS: Target date: 11/10/22  Pt will demonstrate improved fine motor coordination for ADLs as evidenced by decreasing 9 hole peg test score for RUE by 5 secs Baseline: Right: 28.78 sec; Left: 20.10 sec Goal status: IN PROGRESS  2.  Pt will be able to engage in dual task activity with 100% accuracy to demonstrate increase awareness, organization, and sequencing. Baseline:  Goal status: IN PROGRESS  3.  Pt will complete a moderately challenging bill paying/computer task <2 errors due to aphasia. Baseline:  Goal status: IN PROGRESS  4.  Pt will complete a 3 step task to carryover to IADL/work tasks with <1 cue for recall/sequencing. Baseline:  Goal status: IN PROGRESS   ASSESSMENT:  CLINICAL IMPRESSION: Pt reports persistent pain in hand, reiterated encouragement to continue to monitor and contact MD if it worsens or does not get better.  Pt demonstrating difficulty with multi-tasking due to aphasia more than cognition, also aphasia more prevalent in written form than spoken.  Pt demonstrating decreased strength in R hand with typing, responding well to thumb strengthening exercises with theraputty and rubber band.  PERFORMANCE DEFICITS: in functional skills including IADLs, coordination, sensation, Fine motor  control, Gross motor control, endurance, and UE functional use, cognitive skills including attention, memory, orientation, problem solving, safety awareness, sequencing, thought, and understand, and psychosocial skills including environmental adaptation, habits, and routines and behaviors.   IMPAIRMENTS: are limiting patient from IADLs, work, leisure, and social participation.   CO-MORBIDITIES: may have co-morbidities  that affects occupational performance. Patient will benefit from skilled OT to address above impairments and improve overall function.  MODIFICATION OR ASSISTANCE TO COMPLETE EVALUATION: No modification of tasks or assist necessary to complete an evaluation.  OT OCCUPATIONAL PROFILE AND HISTORY: Problem focused assessment: Including review of records relating to presenting problem.  CLINICAL DECISION MAKING: LOW - limited treatment options, no task modification necessary  REHAB POTENTIAL: Good  EVALUATION COMPLEXITY: Low    PLAN:  OT FREQUENCY: 1-2x/week  OT DURATION: 8 weeks  PLANNED INTERVENTIONS: self care/ADL training, therapeutic exercise, therapeutic activity, neuromuscular re-education, functional mobility training, electrical stimulation, ultrasound, compression bandaging, moist heat, cryotherapy, patient/family education, cognitive remediation/compensation, psychosocial skills training, energy conservation, coping strategies training, and DME and/or AE instructions  RECOMMENDED OTHER SERVICES: NA  CONSULTED AND AGREED WITH PLAN OF CARE: Patient and family member/caregiver  PLAN FOR NEXT SESSION: picking up items with tweezers/tongs, coordination, typing/computer use, dual tasking and multi-step commands for sequencing, incorporate more gross motor and/or weight stack    Georgianne Gritz, OTR/L 10/25/2022, 10:24 AM

## 2022-10-30 ENCOUNTER — Encounter: Payer: PRIVATE HEALTH INSURANCE | Admitting: Occupational Therapy

## 2022-10-30 NOTE — Therapy (Signed)
OUTPATIENT OCCUPATIONAL THERAPY NEURO TREATMENT NOTE Patient Name: Cameron Hudson MRN: [id number] DOB:[birth year], 57 y.o., male Today's Date: 11/01/2022  PCP: Creola Corn, MD REFERRING PROVIDER: Creola Corn, MD  END OF SESSION:  OT End of Session - 11/01/22 0938     Visit Number 11    Number of Visits 17    Date for OT Re-Evaluation 11/10/22    Authorization Type [employer] / [employer] [id number]    Authorization - Number of Visits 20   OT/PT combined   OT Start Time 0938    OT Stop Time 1012    OT Time Calculation (min) 34 min    Activity Tolerance Patient limited by pain;Patient tolerated treatment well;No increased pain;Patient limited by fatigue    Behavior During Therapy [facility] for tasks assessed/performed             Past Medical History:  Diagnosis Date   Allergy    Asthma    CAD S/P percutaneous coronary angioplasty 06/20/2017   Inferior STEMI - 100% dRCA --> DES PCI with Synergy 3.0 x 24 (3.3 mm). Mild 10-30% disease in p-mRCA, Cx & LAD.  EF Normal.    Hemorrhoids    Hx of adenomatous polyp of colon 08/01/2019   Hyperlipidemia    IBS (irritable bowel syndrome)    Personal history of colonic polyps 2004   12 mm adenoma   Seasonal allergies    STEMI involving oth coronary artery of inferior wall (HCC) 06/20/2017   12/18 PCI/DESx1 to dRCA, normal EF   Past Surgical History:  Procedure Laterality Date   CHOLECYSTECTOMY N/A 11/28/2019   Procedure: LAPAROSCOPIC CHOLECYSTECTOMY;  Surgeon: Abigail Miyamoto, MD;  Location: [facility];  Service: General;  Laterality: N/A;   COLONOSCOPY     COLONOSCOPY W/ POLYPECTOMY  08/27/2002; 08/30/2005; 01/06/2011   12mm adenoma, diverticulosis, hemorrhoids; normal;  diverticulosis and small hemorrhoids   CORONARY STENT INTERVENTION N/A 06/20/2017   Procedure: CORONARY STENT INTERVENTION;  Surgeon: Lennette Bihari, MD;  Location: [facility];  Service: Cardiovascular;  Laterality: N/A;   LEFT HEART CATH AND CORONARY  ANGIOGRAPHY N/A 06/20/2017   Procedure: LEFT HEART CATH AND CORONARY ANGIOGRAPHY;  Surgeon: Lennette Bihari, MD;  Location: [facility];  Service: Cardiovascular;  Laterality: N/A;   NASAL SINUS SURGERY  Dec 2011   POLYPECTOMY     Patient Active Problem List   Diagnosis Date Noted   Hx of adenomatous polyp of colon 08/01/2019   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   IFG (impaired fasting glucose) 08/11/2017   Hypogonadism, male 08/11/2017   Statin intolerance 08/09/2017   Encounter for long-term (current) use of medications 08/09/2017   Hyperlipidemia with target low density lipoprotein (LDL) cholesterol less than 70 mg/dL 62/95/2841   STEMI involving oth coronary artery of inferior wall (HCC) 06/20/2017   CAD S/P percutaneous coronary angioplasty 06/20/2017   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   Acute pain of right shoulder 10/07/2016   Radicular pain 08/09/2016   Seasonal allergies 07/31/2013   History of chronic sinusitis 05/11/2012   Allergy 05/11/2012   Asthma, exercise induced 10/09/2010   Chronic sinusitis 10/09/2010   Perennial allergic rhinitis with seasonal variation 10/09/2010    ONSET DATE: 09/06/22  REFERRING DIAG: I63.9 (ICD-10-CM) - CVA (cerebral vascular accident)  THERAPY DIAG:  Hemiplegia and hemiparesis following cerebral infarction affecting right non-dominant side (HCC)  Other lack of coordination  Attention and concentration deficit  Other disturbances of skin sensation  Rationale for Evaluation and Treatment: Rehabilitation  PERTINENT HISTORY: Coronary artery disease, s/p inf STEMI and DES to occluded RCA in 05/2017  PRECAUTIONS: None  WEIGHT BEARING RESTRICTIONS: No   SUBJECTIVE:   SUBJECTIVE STATEMENT: Pt states his Rt hand is numb "all the time" and that his Rt hand is burning/electric pain today.   Pt accompanied by: self and significant other (spouse [name])   PAIN:  Are you having pain? Yes in Rt hand "moderate"   FALLS: Has  patient fallen in last 6 months? Yes. Number of falls 1 - collapsed with onset of CVA  LIVING ENVIRONMENT: Lives with: lives with their family (32 and 64 yo daughters) Lives in: House/apartment Stairs: Yes: Internal: able to remain on main level will full bedroom/bathroom, does have 2nd floor - children's bedrooms are upstairs steps; on right going up and External: 4-5 steps; on right going up Has following equipment at home: None  PLOF: Independent and Independent with basic ADLs, sees pt's 2 days per week and admin 1.5 days  PATIENT GOALS: to make sure that I don't need OT   OBJECTIVE: (All objective assessments below are from initial evaluation on: 09/13/22 unless otherwise specified.)   HAND DOMINANCE: Left  ADLs: Overall ADLs: Independent with bathing/dressing and bathroom transfers, no difficulty with clothing fasteners Transfers/ambulation related to ADLs: Independent Equipment: none  IADLs: Pt just returned home from hospital yesterday, has not yet attempted any IADLs.  Pt reports that he feels he could do them, but has not had the opportunity to attempt. Medication management: Spouse is currently assisting with dispensing medications  Financial management: Did go in to the clinic to do some payroll with supervision Handwriting: 100% legible  MOBILITY STATUS: Independent  POSTURE COMMENTS:  No Significant postural limitations  ACTIVITY TOLERANCE: Activity tolerance: WFL for tasks assessed on eval  FUNCTIONAL OUTCOME MEASURES: FOTO: 91, predicted 98 at d/c  UPPER EXTREMITY ROM:  WFL bilaterally   UPPER EXTREMITY MMT:   Grossly 5/5 bilaterally  HAND FUNCTION: Grip strength: Right: 90 lbs; Left: 100 lbs, Lateral pinch: Right: 23 lbs, Left: 21 lbs, and 3 point pinch: Right: 20 lbs, Left: 17 lbs  COORDINATION: Finger Nose Finger test: R mild decreased coordination and speed compared to L 9 Hole Peg test: Right: 28.78 sec; Left: 20.10 sec  SENSATION: Slight  decreased sensation in R hand, along ulnar distribution  COGNITION: Overall cognitive status: Within functional limits for tasks assessed and requires slowing with verbal cues to allow for increased time to process. Pt with difficulty with multi-step directions  VISION: Subjective report: no issues Baseline vision: Wears glasses for reading only  PRAXIS: Impaired: possible mild apraxia, however difficult to assess due to aphasia   TODAY'S TREATMENT:                                                              11/01/22: He was mainly given education for sensory re-education (nerve glides, desensitization with vibration tool), pain relief, divided attention strategies, and in-hand manipulation strategies today. OT goes over each of these with spouse as well, the pt stating feeling good tension in nerves and showing difficulty with FMS tasks.  He states vibration does make his hand pain feel better. He also seems to have numbness and OT advises to use caution with heat and knives- he does state  almost burning numb Rt hand on stove last week.   In-Hand Manipulation Skills (do 2-3 x day for ~10 mins)  Rotation:  Hold pen, try to "twirl" like a baton, keeping flat with surface of table. Try going BOTH directions 10x  Flip:  Hold pen in writing position, flip in an arch to "erase" position, then back to "write" position. Do not lift hand off table.  10x  Translation:  Open hand palm up,  put an object in your palm and then use your fingers and thumb to move it to the tips of your fingers, pinched against your thumb.  10x (bigger is easier (fat marker), smaller is harder (penny)) Shift:  Hold pen like a dart, start "shifting" it forward until you are holding it at the base, then shift it backwards until you are holding it at the tip again. 10x (like putting a key in a key hole)   Waiter Carry Nerve glide - do 3x day 5-10 x slowly feeling tension through Rt arm nerves. Don't make it painful!     10/25/22 Alternating attention: engaged in table top task with alternating attention grid, pt able to complete without issues, however with added cognitive challenge pt unable to alternate attention between conversation and table top task. Phone/computer use: pt reports difficulty with typing and texting.  Pt showing text conversation with friend, demonstrating increased aphasia in writing than in speaking.  OT instructed pt to text with spouse (in room) to allow OT to observe coordination and language.  Pt demonstrating mild decreased coordination with R hand, demonstrating "dropping" of thumb frequently causing him to hit incorrect buttons, inappropriately.  Engaged in typing on computer, pt still with aphasia impacting subject matter of typing, however noted decreased strength and positioning of RUE when typing.   Hand strengthening: OT instructed pt in thumb flexion and extension against theraputty and provided with rubber band to allow pt to address flexion, extension, and abduction.  Pt able to demonstrate understanding.   PATIENT EDUCATION: Education details: ongoing condition specific education Person educated: Patient and Spouse Education method: Explanation Education comprehension: verbalized understanding  HOME EXERCISE PROGRAM: Access Code: [email or web address] URL: [email or web address] Date: 10/25/2022 Prepared by: [facility] - [facility]  Exercises - Putty Squeezes  - 1 x daily - 10 reps - Thumb Opposition with Putty  - 1 x daily - 10 reps - 3-Point Pinch with Putty  - 1 x daily - 10 reps - Finger Lumbricals with Putty  - 1 x daily - 10 reps - Marble Pick Up from Putty  - 1 x daily - 10 reps - Thumb MCP and IP Flexion with Putty  - 1 x daily - 10 reps - Finger Extension with Putty  - 1 x daily - 10 reps   GOALS: Goals reviewed with patient? Yes  SHORT TERM GOALS: Target date: 10/13/22  Pt will be Independent with coordination HEP to  increase independence with ADLs/IADLs Baseline: Goal status: MET - 10/11/22  2.  Pt will complete table top scanning activity without cues, demonstrating improved sustained attention to task. Baseline:  Goal status: MET - 10/11/22  3.  Pt will complete dual task activity in mild distracting environment with 90% accuracy. Baseline:  Goal status: IN PROGRESS   LONG TERM GOALS: Target date: 11/10/22  Pt will demonstrate improved fine motor coordination for ADLs as evidenced by decreasing 9 hole peg test score for RUE by 5 secs Baseline: Right: 28.78 sec; Left: 20.10 sec  Goal status: IN PROGRESS  2.  Pt will be able to engage in dual task activity with 100% accuracy to demonstrate increase awareness, organization, and sequencing. Baseline:  Goal status: IN PROGRESS  3.  Pt will complete a moderately challenging bill paying/computer task <2 errors due to aphasia. Baseline:  Goal status: IN PROGRESS  4.  Pt will complete a 3 step task to carryover to IADL/work tasks with <1 cue for recall/sequencing. Baseline:  Goal status: IN PROGRESS   ASSESSMENT:  CLINICAL IMPRESSION: 11/01/22: He is improving knowledge and management of nerve pain, hand coordination and attention skills. Carry on, NEXT session PROG note   10/25/22: Pt reports persistent pain in hand, reiterated encouragement to continue to monitor and contact MD if it worsens or does not get better.  Pt demonstrating difficulty with multi-tasking due to aphasia more than cognition, also aphasia more prevalent in written form than spoken.  Pt demonstrating decreased strength in R hand with typing, responding well to thumb strengthening exercises with theraputty and rubber band.   PLAN:  OT FREQUENCY: 1-2x/week  OT DURATION: 8 weeks  PLANNED INTERVENTIONS: self care/ADL training, therapeutic exercise, therapeutic activity, neuromuscular re-education, functional mobility training, electrical stimulation, ultrasound, compression  bandaging, moist heat, cryotherapy, patient/family education, cognitive remediation/compensation, psychosocial skills training, energy conservation, coping strategies training, and DME and/or AE instructions  CONSULTED AND AGREED WITH PLAN OF CARE: Patient and family member/caregiver  PLAN FOR NEXT SESSION: Needs progress visit next visit to determine goal status.    Fannie Knee, OTR/L 11/01/2022, 10:17 AM

## 2022-11-01 ENCOUNTER — Ambulatory Visit: Payer: PRIVATE HEALTH INSURANCE | Admitting: Rehabilitative and Restorative Service Providers"

## 2022-11-01 ENCOUNTER — Encounter: Payer: Self-pay | Admitting: Rehabilitative and Restorative Service Providers"

## 2022-11-01 ENCOUNTER — Ambulatory Visit: Payer: PRIVATE HEALTH INSURANCE

## 2022-11-01 DIAGNOSIS — I69353 Hemiplegia and hemiparesis following cerebral infarction affecting right non-dominant side: Secondary | ICD-10-CM

## 2022-11-01 DIAGNOSIS — R4701 Aphasia: Secondary | ICD-10-CM

## 2022-11-01 DIAGNOSIS — R41840 Attention and concentration deficit: Secondary | ICD-10-CM

## 2022-11-01 DIAGNOSIS — R208 Other disturbances of skin sensation: Secondary | ICD-10-CM

## 2022-11-01 DIAGNOSIS — R278 Other lack of coordination: Secondary | ICD-10-CM

## 2022-11-01 NOTE — Therapy (Signed)
[facility] [address], [address] [place], [place], [address] Phone: [phone]   Fax:  [phone]  Patient Details  Name: Cameron Hudson MRN: [id number] Date of Birth: 05-13-1966 Referring Provider:  Creola Corn, MD  Encounter Date: 11/01/2022  ST-Arrive/Cancel Pt and wife entered visibly upset. SLP inquired and learned pt and wife were going through some difficult decisions as a result of pt's CVA.  SLP provided some information re: [employer] and educated pt/wife about some aphasia camps around the nation. Pt/wife to inquire about a 2-3 week camp in [place]. Pt, wife, and SLP thought it best to end session early today.   Rafiel Mecca, CCC-SLP 11/01/2022, 11:05 AM  [facility] [address] [place], [place], [address] Phone: [phone]   Fax:  [phone]

## 2022-11-06 ENCOUNTER — Ambulatory Visit: Payer: PRIVATE HEALTH INSURANCE | Admitting: Occupational Therapy

## 2022-11-06 ENCOUNTER — Encounter: Payer: PRIVATE HEALTH INSURANCE | Admitting: Speech Pathology

## 2022-11-08 ENCOUNTER — Ambulatory Visit: Payer: PRIVATE HEALTH INSURANCE

## 2022-11-08 ENCOUNTER — Ambulatory Visit: Payer: PRIVATE HEALTH INSURANCE | Admitting: Occupational Therapy

## 2022-11-08 DIAGNOSIS — R208 Other disturbances of skin sensation: Secondary | ICD-10-CM

## 2022-11-08 DIAGNOSIS — R4701 Aphasia: Secondary | ICD-10-CM

## 2022-11-08 DIAGNOSIS — R278 Other lack of coordination: Secondary | ICD-10-CM

## 2022-11-08 DIAGNOSIS — I69353 Hemiplegia and hemiparesis following cerebral infarction affecting right non-dominant side: Secondary | ICD-10-CM

## 2022-11-08 DIAGNOSIS — R41840 Attention and concentration deficit: Secondary | ICD-10-CM

## 2022-11-08 NOTE — Therapy (Signed)
OUTPATIENT SPEECH LANGUAGE PATHOLOGY APHASIA TREATMENT   Patient Name: Cameron Hudson MRN: [id number] DOB:08-12-1965, 57 y.o., male Today's Date: 11/08/2022  PCP: Creola Corn, MD REFERRING PROVIDER: [facility] MD Timothy Lasso- Doc)  END OF SESSION:  End of Session - 11/08/22 0935     Visit Number 11    Number of Visits 17    Date for SLP Re-Evaluation 11/22/22    Authorization Time Period ST= 20 visits (per case mgr)    Authorization - Visit Number 11    Authorization - Number of Visits 20    SLP Start Time [phone]    SLP Stop Time  1015    SLP Time Calculation (min) 41 min    Activity Tolerance Patient tolerated treatment well                  Past Medical History:  Diagnosis Date   Allergy    Asthma    CAD S/P percutaneous coronary angioplasty 06/20/2017   Inferior STEMI - 100% dRCA --> DES PCI with Synergy 3.0 x 24 (3.3 mm). Mild 10-30% disease in p-mRCA, Cx & LAD.  EF Normal.    Hemorrhoids    Hx of adenomatous polyp of colon 08/01/2019   Hyperlipidemia    IBS (irritable bowel syndrome)    Personal history of colonic polyps 2004   12 mm adenoma   Seasonal allergies    STEMI involving oth coronary artery of inferior wall (HCC) 06/20/2017   12/18 PCI/DESx1 to dRCA, normal EF   Past Surgical History:  Procedure Laterality Date   CHOLECYSTECTOMY N/A 11/28/2019   Procedure: LAPAROSCOPIC CHOLECYSTECTOMY;  Surgeon: Abigail Miyamoto, MD;  Location: [facility];  Service: General;  Laterality: N/A;   COLONOSCOPY     COLONOSCOPY W/ POLYPECTOMY  08/27/2002; 08/30/2005; 01/06/2011   12mm adenoma, diverticulosis, hemorrhoids; normal;  diverticulosis and small hemorrhoids   CORONARY STENT INTERVENTION N/A 06/20/2017   Procedure: CORONARY STENT INTERVENTION;  Surgeon: Lennette Bihari, MD;  Location: [facility];  Service: Cardiovascular;  Laterality: N/A;   LEFT HEART CATH AND CORONARY ANGIOGRAPHY N/A 06/20/2017   Procedure: LEFT HEART CATH AND CORONARY  ANGIOGRAPHY;  Surgeon: Lennette Bihari, MD;  Location: [facility];  Service: Cardiovascular;  Laterality: N/A;   NASAL SINUS SURGERY  Dec 2011   POLYPECTOMY     Patient Active Problem List   Diagnosis Date Noted   Hx of adenomatous polyp of colon 08/01/2019   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   IFG (impaired fasting glucose) 08/11/2017   Hypogonadism, male 08/11/2017   Statin intolerance 08/09/2017   Encounter for long-term (current) use of medications 08/09/2017   Hyperlipidemia with target low density lipoprotein (LDL) cholesterol less than 70 mg/dL 11/91/4782   STEMI involving oth coronary artery of inferior wall (HCC) 06/20/2017   CAD S/P percutaneous coronary angioplasty 06/20/2017   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 G. [facility]" "Makes me tired." (Pt, after explanation)  Pt accompanied by: significant other wife-Jill  PERTINENT HISTORY: HLD,  CAD,S/P one stent  PAIN: Are you having pain? Yes: NPRS scale: 3/10 Pain location: rt hand Pain description: constant, pins and needles  FALLS: Has patient fallen in last 6 months?  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   PATIENT REPORTED OUTCOME MEASURES (PROM): Communication Effectiveness Survey: (CES) :Pt filled out CES  and scored himself 13/32 (higher numbers indicate better QOL/effective  communication). "1" were scored for participating in conversation with strangers in a quiet place, conversing with a stranger over the telephone, conversing in a noisy environment, and having a conversation with someone at a distance. [name] scored pt 10/32, with "1" for participating in a conversation with strangers in a quiet place, with a familiar person over the telephone, with a stranger over the telephone, in a noisy environment, with a friend when emotionally upset or angry, and having a conversation with someone at a distance.. Pt's higher score likely indicates his awareness of errors is not accurate due to his aphasia.   TODAY'S TREATMENT:                                                                                                                                         11/08/22: Pt explained that [facility] may be running an aphasia camp this summer, functionally. SLP educated pt and wife about V-NEST for semantic coherence in verbal and written language, as a self-cueing technique, and as a strategy for error awareness. Pt wrote subject and object with independence, triads with independence, and expanded sentence with occasional min A for functor words. Pt tearful with errors x1. SLP reassured pt that he spoke sentence correctly but wrote incorrect word and that practice will improve this area. Told pt to start by completing one/day and incr as he is comfortable.  10/25/22: Pt asked about writing skills and SLP had pt show SLP his texts. SLP copied some tsks for pt and explained them to pt/wife. Writing has agrammatism, largely. Homework is to read proper English sentences and note the differences.  10/18/22: SLP focused on practice today with expressive speech with using compensations like phone (map and photos apps). Pt's message was 98% functionally comprehended by SLP.  Stressed to pt/wife to have the words [name] knows Gianmarco desires to say provided with first 2 phonemes by [name] and then increasing as  necessary the sounds spoken to him. Then pt should write and say these words 5x - he did so today with "group project" and after 45 seconds pt told SLP perfectly.   10/11/22: SLP targeted salient and meaningful language with Tieler today, engaging in simple conversation with some rare mod complex conversation which again was functional with using written cues. SLP cont'd to then return to error words and assist pt production - success 25% independently and this incr'd to 50% with mod/max A.  Simple divergent naming task with basic category (colors) completed with pt generating 7 colors independently without any aphasic errors. He req'd mod A to generate 4 more and needed max A for "teal" which pt initiated and used compensatory strategy of description saying, "[facility] color".   10/04/22: Pt functionally told SLP about the weekend with rare min questioning cues from SLP, and second attempts by pt. SLP targeted functional therapy in a conversational manner - this was fostered by pt writing words he could not articulate correctly and SLP practicing those words with pt. SLP cont to model this type of practice for [name] - as pt's schedule has ~9 hours of drive time/week that he practices with [facility]. SLP cont to encourage conversational practice at times other than car time. SLP reiterated strongly that pt must practice the target words afterwards for maximum benefit for practice. Pt wife asks SLP about strategies for pronoun expression. SLP encouraged pt to slow his rate of speech and told wife/pt that pronouns may well be one thing that pt will need to pay special attention to from this point onward with his spontaneous speech.  09/28/22: Pt used written augmentation extensively today in 15-minutes simple to mod complex conversation with SLP and was nearly functional with SLP semantic and articulatory cues. SLP educated wife how to use semantic cues with pt today via model and direct instruction. Pt cont to work  with constant therapy at home 40-70 minutes/day.  09/27/22:SLP provided CES today for both pt and [name]; Results above. SLP targeted functional language with salient and high interest topics in simple conversation. Pt was nearly functional but req'd SLP questioning cues rarely. Pt augmented verbal communication with written communication on a pad successfully 90% of the time. Pt's spontaneous speech is more grammatically correct and more prevalent today than in previous sessions.   SLP then linked clinician CT to pt's CT profile.   09/20/22: SLP targeted expressive and receptive language. SLP modeled practice for home tasks and provided wife with suggested cues for target word. Pt benefited today from semantic cues. Pt filled in 2 letters for body parts with 4/6 success and incr'd to 6/6 with min A. Written cues from pt did not appear to assist verbal expression. Neologism and semantic errors prevalent, with incr'd awareness than Monday.  09/18/22:QAB completed today. Score above. Pt with less frustration today - self-calming behavior, "It's ok it's ok" x4. SLP provided education on [facility] telling pt/wife that pt could work on it if frustration level does not inhibit ability to complete. Homework to say family names, other automatic speech, and cloze phrases/sentences.  SLP provided clinic number for [facility] and suggested wife call to obtain information about possible summer sessions.  09/14/22: Expressed frustration re: hwk for sentence completions. Demo'd good comprehension of task, correct completion of sentences, and accurate writing; however, usual verbal reading errors noted, particularly content words. Reduced awareness of errors demonstrated initially, which improved throughout session. SLP educated rationale of task, including task hierarchy (starting with easier, more automatic tasks and then advancing) and targeted deficits (comprehension and expression). Writing noted as relative strength with  pt often writing key words to optimize listener comprehension given usual neologisms and paraphasias. SLP targeted verbal expression of personally relevant words from his personal notebook. SLP demonstrated recommended cueing hierarchy to maximize patient performance. Pt benefited from usual verbal/visual modeling fading to visual modeling (mouthing sounds) to saying word in response to a question to saying word independently with intermittent choral practice needed. See  patient instructions for additional education and HEP. Briefly discussed auditory comprehension strategies (slower rate, repetition, rephrasing, confirmation); additional education warranted.   PATIENT EDUCATION: Education details: see above Person educated: Patient and Spouse Education method: Explanation, Demonstration, Verbal cues, and Handouts Education comprehension: verbalized understanding, verbal cues required, and needs further education   GOALS: Goals reviewed with patient? Yes  SHORT TERM GOALS: Target date: 10/15/22  Pt will name average 8 items in salient, practical categories in 2 sessions Baseline: 10/11/22 Goal status: MET  2.  Pt will communicate basic needs/wants with use of compensatory strategies in 3 sessions Baseline: 10/04/22, 10/11/22 Goal status: MET  3.  Pt will demo understanding of sentence stimuli in structured tasks in 3 sessions Baseline: 10/04/22, 10/11/22 Goal status: MET  4.  With extra time, pt will demo understanding of 2-spoken sentence stimuli in 3 sessions Baseline:  Goal status: MET   LONG TERM GOALS: Target date: 11/22/22  Pt will name average 8 items in simple categories (non-personal for pt) in 3 sessions Baseline:  Goal status: IN PROGRESS  2.  Pt will produce MLU of >6.5 average in 3-minute speech sample  Baseline:  Goal status: IN PROGRESS  3.  Pt family will demo appropriate cueing for pt's receptive and expressive language in 5 sessions Baseline:  Goal status: IN  PROGRESS  4.  Pt will score higher on PROM than on initial reading in first 2 sessions Baseline:  Goal status: IN PROGRESS   ASSESSMENT:  CLINICAL IMPRESSION: Patient is a 57 y.o. male who was seen today for treatment of expressive and receptive language. Verbal expression cont to contain neologism and phonemic paraphasias. 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  OBJECTIVE IMPAIRMENTS: include expressive language, receptive language, aphasia, and dysphagia. These impairments are limiting patient from return to work, household responsibilities, ADLs/IADLs, and effectively communicating at home and in community. Factors affecting potential to achieve goals and functional outcome are severity of impairments and limited insurance coverage for ST . Patient will benefit from skilled SLP services to address above impairments and improve overall function.  REHAB POTENTIAL: Good  PLAN:  SLP FREQUENCY: 1-2x/week  SLP DURATION:  20 sessions  PLANNED INTERVENTIONS: Language facilitation, Environmental controls, Trials of upgraded texture/liquids, Cueing hierachy, Internal/external aids, Functional tasks, Multimodal communication approach, SLP instruction and feedback, Compensatory strategies, and Patient/family education    [facility], CCC-SLP 11/08/2022, 9:36 AM

## 2022-11-08 NOTE — Therapy (Signed)
OUTPATIENT OCCUPATIONAL THERAPY NEURO  TREATMENT NOTE & Progress Note Patient Name: Cameron Hudson MRN: [id number] DOB:Jun 19, 1966, 57 y.o., male Today's Date: 11/08/2022  PCP: Creola Corn, MD REFERRING PROVIDER: Creola Corn, MD  Occupational Therapy Progress Note  Dates of Reporting Period: 09/13/22 to 11/08/22   END OF SESSION:  OT End of Session - 11/08/22 1011     Visit Number 12    Number of Visits 17    Date for OT Re-Evaluation 12/20/22    Authorization Type [employer] / [employer] [id number]    Authorization - Number of Visits 20   OT/PT combined   OT Start Time 1018    OT Stop Time 1100    OT Time Calculation (min) 42 min    Activity Tolerance Patient limited by pain;Patient tolerated treatment well;No increased pain;Patient limited by fatigue    Behavior During Therapy [facility] for tasks assessed/performed              Past Medical History:  Diagnosis Date   Allergy    Asthma    CAD S/P percutaneous coronary angioplasty 06/20/2017   Inferior STEMI - 100% dRCA --> DES PCI with Synergy 3.0 x 24 (3.3 mm). Mild 10-30% disease in p-mRCA, Cx & LAD.  EF Normal.    Hemorrhoids    Hx of adenomatous polyp of colon 08/01/2019   Hyperlipidemia    IBS (irritable bowel syndrome)    Personal history of colonic polyps 2004   12 mm adenoma   Seasonal allergies    STEMI involving oth coronary artery of inferior wall (HCC) 06/20/2017   12/18 PCI/DESx1 to dRCA, normal EF   Past Surgical History:  Procedure Laterality Date   CHOLECYSTECTOMY N/A 11/28/2019   Procedure: LAPAROSCOPIC CHOLECYSTECTOMY;  Surgeon: Abigail Miyamoto, MD;  Location: [facility];  Service: General;  Laterality: N/A;   COLONOSCOPY     COLONOSCOPY W/ POLYPECTOMY  08/27/2002; 08/30/2005; 01/06/2011   12mm adenoma, diverticulosis, hemorrhoids; normal;  diverticulosis and small hemorrhoids   CORONARY STENT INTERVENTION N/A 06/20/2017   Procedure: CORONARY STENT INTERVENTION;  Surgeon: Lennette Bihari, MD;   Location: [facility];  Service: Cardiovascular;  Laterality: N/A;   LEFT HEART CATH AND CORONARY ANGIOGRAPHY N/A 06/20/2017   Procedure: LEFT HEART CATH AND CORONARY ANGIOGRAPHY;  Surgeon: Lennette Bihari, MD;  Location: [facility];  Service: Cardiovascular;  Laterality: N/A;   NASAL SINUS SURGERY  Dec 2011   POLYPECTOMY     Patient Active Problem List   Diagnosis Date Noted   Hx of adenomatous polyp of colon 08/01/2019   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   IFG (impaired fasting glucose) 08/11/2017   Hypogonadism, male 08/11/2017   Statin intolerance 08/09/2017   Encounter for long-term (current) use of medications 08/09/2017   Hyperlipidemia with target low density lipoprotein (LDL) cholesterol less than 70 mg/dL 40/98/1191   STEMI involving oth coronary artery of inferior wall (HCC) 06/20/2017   CAD S/P percutaneous coronary angioplasty 06/20/2017   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   Acute pain of right shoulder 10/07/2016   Radicular pain 08/09/2016   Seasonal allergies 07/31/2013   History of chronic sinusitis 05/11/2012   Allergy 05/11/2012   Asthma, exercise induced 10/09/2010   Chronic sinusitis 10/09/2010   Perennial allergic rhinitis with seasonal variation 10/09/2010    ONSET DATE: 09/06/22  REFERRING DIAG: I63.9 (ICD-10-CM) - CVA (cerebral vascular accident)  THERAPY DIAG:  Hemiplegia and hemiparesis following cerebral infarction affecting right non-dominant side (HCC)  Other lack of  coordination  Attention and concentration deficit  Other disturbances of skin sensation  Rationale for Evaluation and Treatment: Rehabilitation  PERTINENT HISTORY: Coronary artery disease, s/p inf STEMI and DES to occluded RCA in 05/2017  PRECAUTIONS: None  WEIGHT BEARING RESTRICTIONS: No   SUBJECTIVE:   SUBJECTIVE STATEMENT: Pt reports whole hand is numb and now that ulnar nerve injury has resolved he is more aware of the numbness in the whole hand.     Pt accompanied by: self and significant other (spouse [name])   PAIN:  Are you having pain? Yes, 3/10 in Rt hand   FALLS: Has patient fallen in last 6 months? Yes. Number of falls 1 - collapsed with onset of CVA  LIVING ENVIRONMENT: Lives with: lives with their family (73 and 24 yo daughters) Lives in: House/apartment Stairs: Yes: Internal: able to remain on main level will full bedroom/bathroom, does have 2nd floor - children's bedrooms are upstairs steps; on right going up and External: 4-5 steps; on right going up Has following equipment at home: None  PLOF: Independent and Independent with basic ADLs, sees pt's 2 days per week and admin 1.5 days  PATIENT GOALS: to make sure that I don't need OT   OBJECTIVE: (All objective assessments below are from initial evaluation on: 09/13/22 unless otherwise specified.)   HAND DOMINANCE: Left  ADLs: Overall ADLs: Independent with bathing/dressing and bathroom transfers, no difficulty with clothing fasteners Transfers/ambulation related to ADLs: Independent Equipment: none  IADLs: Pt just returned home from hospital yesterday, has not yet attempted any IADLs.  Pt reports that he feels he could do them, but has not had the opportunity to attempt. Medication management: Spouse is currently assisting with dispensing medications  Financial management: Did go in to the clinic to do some payroll with supervision Handwriting: 100% legible  MOBILITY STATUS: Independent  POSTURE COMMENTS:  No Significant postural limitations  ACTIVITY TOLERANCE: Activity tolerance: WFL for tasks assessed on eval  FUNCTIONAL OUTCOME MEASURES: FOTO: 91, predicted 98 at d/c  UPPER EXTREMITY ROM:  WFL bilaterally   UPPER EXTREMITY MMT:   Grossly 5/5 bilaterally  HAND FUNCTION: Grip strength: Right: 90 lbs; Left: 100 lbs, Lateral pinch: Right: 23 lbs, Left: 21 lbs, and 3 point pinch: Right: 20 lbs, Left: 17 lbs  COORDINATION: Finger Nose Finger test: R  mild decreased coordination and speed compared to L 9 Hole Peg test: Right: 28.78 sec; Left: 20.10 sec  SENSATION: Slight decreased sensation in R hand, along ulnar distribution  COGNITION: Overall cognitive status: Within functional limits for tasks assessed and requires slowing with verbal cues to allow for increased time to process. Pt with difficulty with multi-step directions  VISION: Subjective report: no issues Baseline vision: Wears glasses for reading only  PRAXIS: Impaired: possible mild apraxia, however difficult to assess due to aphasia   TODAY'S TREATMENT:                                                              11/08/22 Self-care: large portion of session involved discussing upcoming d/c vs recertification with consideration of visit limit. Encouraged pt to f/u with PCP in regards to increased pain in LUE, discussed nerve pain.  Pt just received mini-massager with plans to utilize in hand per instruction from previous OT.  Reviewed homunculus  in regards to impaired sensation and impaired motor control. Pt pulled up image of his MRI to compare with homunculus.   9 hole peg: 26.16 seconds and 25.15 sec Stereognosis: able to correctly identify 6/6 items with vision occluded.  Pt demonstrating with toothpick impaired and absent sensation in L palm and finger tips respectively.   Dual tasking: pt continues to report decreased ability to engage in dual tasking, also demonstrating difficulty when in clinic as pt having to stop activities to engage in conversation or listen to instructions due to decreased ability to alternate or divide attention.  Encouraged pt to attempt simple podcasts during routine household tasks to allow for increased challenge. UE ROM: OT instructed pt in ulnar and median nerve gliding.  Pt completing each x5 with reports of min tightness at end ranges.  OT instructed pt in IYTA exercise completing bilaterally in standing with focus on quality of movement and  stretching.  Pt reports tightness bilaterally.  Encouraged pt to balance stretching with strengthening.   11/01/22: He was mainly given education for sensory re-education (nerve glides, desensitization with vibration tool), pain relief, divided attention strategies, and in-hand manipulation strategies today. OT goes over each of these with spouse as well, the pt stating feeling good tension in nerves and showing difficulty with FMS tasks.  He states vibration does make his hand pain feel better. He also seems to have numbness and OT advises to use caution with heat and knives- he does state almost burning numb Rt hand on stove last week.   In-Hand Manipulation Skills (do 2-3 x day for ~10 mins)  Rotation:  Hold pen, try to "twirl" like a baton, keeping flat with surface of table. Try going BOTH directions 10x  Flip:  Hold pen in writing position, flip in an arch to "erase" position, then back to "write" position. Do not lift hand off table.  10x  Translation:  Open hand palm up,  put an object in your palm and then use your fingers and thumb to move it to the tips of your fingers, pinched against your thumb.  10x (bigger is easier (fat marker), smaller is harder (penny)) Shift:  Hold pen like a dart, start "shifting" it forward until you are holding it at the base, then shift it backwards until you are holding it at the tip again. 10x (like putting a key in a key hole)   Waiter Carry Nerve glide - do 3x day 5-10 x slowly feeling tension through Rt arm nerves. Don't make it painful!    10/25/22 Alternating attention: engaged in table top task with alternating attention grid, pt able to complete without issues, however with added cognitive challenge pt unable to alternate attention between conversation and table top task. Phone/computer use: pt reports difficulty with typing and texting.  Pt showing text conversation with friend, demonstrating increased aphasia in writing than in speaking.  OT  instructed pt to text with spouse (in room) to allow OT to observe coordination and language.  Pt demonstrating mild decreased coordination with R hand, demonstrating "dropping" of thumb frequently causing him to hit incorrect buttons, inappropriately.  Engaged in typing on computer, pt still with aphasia impacting subject matter of typing, however noted decreased strength and positioning of RUE when typing.   Hand strengthening: OT instructed pt in thumb flexion and extension against theraputty and provided with rubber band to allow pt to address flexion, extension, and abduction.  Pt able to demonstrate understanding.   PATIENT EDUCATION: Education details:  ongoing condition specific education Person educated: Patient and Spouse Education method: Explanation Education comprehension: verbalized understanding  HOME EXERCISE PROGRAM: Access Code: [email or web address] URL: [email or web address] Date: 10/25/2022 Prepared by: [facility] - Outpatient  Rehab - [facility]  Exercises - Putty Squeezes  - 1 x daily - 10 reps - Thumb Opposition with Putty  - 1 x daily - 10 reps - 3-Point Pinch with Putty  - 1 x daily - 10 reps - Finger Lumbricals with Putty  - 1 x daily - 10 reps - Marble Pick Up from Putty  - 1 x daily - 10 reps - Thumb MCP and IP Flexion with Putty  - 1 x daily - 10 reps - Finger Extension with Putty  - 1 x daily - 10 reps   GOALS: Goals reviewed with patient? Yes  SHORT TERM GOALS: Target date: 10/13/22  Pt will be Independent with coordination HEP to increase independence with ADLs/IADLs Baseline: Goal status: MET - 10/11/22  2.  Pt will complete table top scanning activity without cues, demonstrating improved sustained attention to task. Baseline:  Goal status: MET - 10/11/22  3.  Pt will complete dual task activity in mild distracting environment with 90% accuracy. Baseline:  Goal status: IN PROGRESS   LONG TERM GOALS: Target date: 11/10/22  Pt will  demonstrate improved fine motor coordination for ADLs as evidenced by decreasing 9 hole peg test score for RUE by 5 secs Baseline: Right: 28.78 sec; Left: 20.10 sec Goal status: NOT MET - 25.65 sec  2.  Pt will be able to engage in dual task activity with 100% accuracy to demonstrate increase awareness, organization, and sequencing. Baseline:  Goal status: NOT MET  3.  Pt will complete a moderately challenging bill paying/computer task <2 errors due to aphasia. Baseline:  Goal status: MET  4.  Pt will complete a 3 step task to carryover to IADL/work tasks with <1 cue for recall/sequencing. Baseline:  Goal status: REVISED  NEW LONG TERM GOALS: Target date: 12/20/22  Pt will demonstrate improved fine motor coordination for ADLs as evidenced by decreasing 9 hole peg test score for RUE by 5 secs Baseline: Right: 28.78 sec; Left: 20.10 sec R: 25.65 on 11/08/22 Goal status: INITIAL  2.  Pt will be able to engage in dual task activity with 90% accuracy to demonstrate increase awareness, organization, and sequencing. Baseline:  Goal status: INITIAL  3.  Pt will be independent in LUE ROM/stretch HEP to improve quality of movement with IADLs (meal prep, hanging laundry, etc). Baseline: Goal status: INITIAL    ASSESSMENT:  CLINICAL IMPRESSION: Pt reports increased awareness of impairments and recognizing new areas and tasks that are challenging that he was previously unaware of.  Discussed impaired sensation with pt performing well with stereognosis but still with absent pin point sensation in finger tips and diminished in palm of L hand.  Pt reports tightness when attempting to remove food from microwave, therefore provided ROM/stretching HEP with focus on median and ulnar nerve gliding and IYTA exercise for increased mobility.  Pt continues to demonstrate difficulty with dual tasking reporting that he has to stop tasks to focus on other task. Pt will benefit from continued OT to provide with  additional recommendations for dual tasking and to establish exercise routine to allow for carry over independently due to approaching OT visit limit.  Pt to return for 2 more visits and then recommend hold on therapy vs d/c to allow pt to continue with HEP and task  specific activities.  Pt and spouse report understanding of plan, to allow to have some remaining visits before end of year if needed.   PLAN:  OT FREQUENCY: every other week  OT DURATION: 6 weeks (planning for 4 weeks, however due to scheduling conflicts will ask for 6 weeks)  PLANNED INTERVENTIONS: self care/ADL training, therapeutic exercise, therapeutic activity, neuromuscular re-education, functional mobility training, electrical stimulation, ultrasound, compression bandaging, moist heat, cryotherapy, patient/family education, cognitive remediation/compensation, psychosocial skills training, energy conservation, coping strategies training, and DME and/or AE instructions  CONSULTED AND AGREED WITH PLAN OF CARE: Patient and family member/caregiver  PLAN FOR NEXT SESSION: Clean up HEP with focus on shoulder mobility, coordination, and assess 2 point discrimination if tool has arrived.   Rosalio Loud, OTR/L 11/08/2022, 11:32 AM

## 2022-11-14 ENCOUNTER — Ambulatory Visit (HOSPITAL_COMMUNITY): Payer: Self-pay | Admitting: Licensed Clinical Social Worker

## 2022-11-15 ENCOUNTER — Ambulatory Visit: Payer: PRIVATE HEALTH INSURANCE

## 2022-11-15 DIAGNOSIS — I69353 Hemiplegia and hemiparesis following cerebral infarction affecting right non-dominant side: Secondary | ICD-10-CM | POA: Diagnosis not present

## 2022-11-15 DIAGNOSIS — R4701 Aphasia: Secondary | ICD-10-CM

## 2022-11-15 NOTE — Therapy (Signed)
OUTPATIENT SPEECH LANGUAGE PATHOLOGY APHASIA TREATMENT   Patient Name: Cameron Hudson MRN: [id number] DOB:08-12-1965, 57 y.o., male Today's Date: 11/16/2022  PCP: Cameron Corn, MD REFERRING PROVIDER: [facility] MD Cameron Hudson- Doc)  END OF SESSION:  End of Session - 11/16/22 0837     Visit Number 12    Number of Visits 17    Date for SLP Re-Evaluation 11/22/22    Authorization Time Period ST= 20 visits (per case mgr)    Authorization - Number of Visits 20    SLP Start Time 1533    SLP Stop Time  1615    SLP Time Calculation (min) 42 min    Activity Tolerance Patient tolerated treatment well                   Past Medical History:  Diagnosis Date   Allergy    Asthma    CAD S/P percutaneous coronary angioplasty 06/20/2017   Inferior STEMI - 100% dRCA --> DES PCI with Synergy 3.0 x 24 (3.3 mm). Mild 10-30% disease in p-mRCA, Cx & LAD.  EF Normal.    Hemorrhoids    Hx of adenomatous polyp of colon 08/01/2019   Hyperlipidemia    IBS (irritable bowel syndrome)    Personal history of colonic polyps 2004   12 mm adenoma   Seasonal allergies    STEMI involving oth coronary artery of inferior wall (HCC) 06/20/2017   12/18 PCI/DESx1 to dRCA, normal EF   Past Surgical History:  Procedure Laterality Date   CHOLECYSTECTOMY N/A 11/28/2019   Procedure: LAPAROSCOPIC CHOLECYSTECTOMY;  Surgeon: Cameron Miyamoto, MD;  Location: [facility];  Service: General;  Laterality: N/A;   COLONOSCOPY     COLONOSCOPY W/ POLYPECTOMY  08/27/2002; 08/30/2005; 01/06/2011   12mm adenoma, diverticulosis, hemorrhoids; normal;  diverticulosis and small hemorrhoids   CORONARY STENT INTERVENTION N/A 06/20/2017   Procedure: CORONARY STENT INTERVENTION;  Surgeon: Cameron Bihari, MD;  Location: [facility];  Service: Cardiovascular;  Laterality: N/A;   LEFT HEART CATH AND CORONARY ANGIOGRAPHY N/A 06/20/2017   Procedure: LEFT HEART CATH AND CORONARY ANGIOGRAPHY;  Surgeon: Cameron Bihari, MD;  Location: [facility];  Service: Cardiovascular;  Laterality: N/A;   NASAL SINUS SURGERY  Dec 2011   POLYPECTOMY     Patient Active Problem List   Diagnosis Date Noted   Hx of adenomatous polyp of colon 08/01/2019   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   IFG (impaired fasting glucose) 08/11/2017   Hypogonadism, male 08/11/2017   Statin intolerance 08/09/2017   Encounter for long-term (current) use of medications 08/09/2017   Hyperlipidemia with target low density lipoprotein (LDL) cholesterol less than 70 mg/dL 40/98/1191   STEMI involving oth coronary artery of inferior wall (HCC) 06/20/2017   CAD S/P percutaneous coronary angioplasty 06/20/2017   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  Pt accompanied by: self   PERTINENT HISTORY: HLD, CAD,S/P one stent  PAIN: Are you having pain? Yes: NPRS scale: 2/10 Pain location: rt  hand Pain description: constant, pins and needles  FALLS: Has patient fallen in last 6 months?  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   PATIENT REPORTED OUTCOME MEASURES (PROM): Communication Effectiveness Survey: (CES) :Pt filled out CES  and scored himself 13/32 (higher numbers indicate better QOL/effective communication). "1" were scored for participating in conversation with strangers in a quiet place, conversing with a  stranger over the telephone, conversing in a noisy environment, and having a conversation with someone at a distance. Cameron Hudson scored pt 10/32, with "1" for participating in a conversation with strangers in a quiet place, with a familiar person over the telephone, with a stranger over the telephone, in a noisy environment, with a friend when emotionally upset or angry, and having a conversation with someone at a distance.. Pt's higher score likely indicates his awareness of errors is not accurate due to his aphasia.   TODAY'S TREATMENT:                                                                                                                                         11/15/22: SLP had simple-mod complex conversation of 5 minutes with pt and req'd occasional min questioning cues and pt writing error words on notepad. This was 99% successful. Neologism noted. In structured practice, SLP assisted pt generate two more entries into his VNEST, as he had only completed one/three. Pt with verb tense errors, rarely, and left out functor words in  his sentence expansion. Targeting precise verbal expression, SLP used action pictures and pt named the verb, and then the other present tense form, and then the past tense form. He then expanded the sentence to include a subject and object. Occasionally pt added expanded the sentence, with good success.  11/08/22: Pt explained that [facility] may be running an aphasia camp this summer, functionally. SLP educated pt and wife about V-NEST for semantic coherence in verbal and written language, as a self-cueing technique, and as a strategy for error awareness. Pt wrote subject and object with independence, triads with independence, and expanded sentence with occasional min A for functor words. Pt tearful with errors x1. SLP reassured pt that he spoke sentence correctly but wrote incorrect word and that practice will improve this area. Told pt to start by completing one/day and incr as  he is comfortable.  10/25/22: Pt asked about writing skills and SLP had pt show SLP his texts. SLP copied some tsks for pt and explained them to pt/wife. Writing has agrammatism, largely. Homework is to read proper English sentences and note the differences.  10/18/22: SLP focused on practice today with expressive speech with using compensations like phone (map and photos apps). Pt's message was 98% functionally comprehended by SLP.  Stressed to pt/wife to have the words Cameron Hudson knows Butler desires to say  provided with first 2 phonemes by [facility] and then increasing as necessary the sounds spoken to him. Then pt should write and say these words 5x - he did so today with "group project" and after 45 seconds pt told SLP perfectly.   10/11/22: SLP targeted salient and meaningful language with Nachum today, engaging in simple conversation with some rare mod complex conversation which again was functional with using written cues. SLP cont'd to then return to error words and assist pt production - success 25% independently and this incr'd to 50% with mod/max A. Simple divergent naming task with basic category (colors) completed with pt generating 7 colors independently without any aphasic errors. He req'd mod A to generate 4 more and needed max A for "teal" which pt initiated and used compensatory strategy of description saying, "[facility] color".   10/04/22: Pt functionally told SLP about the weekend with rare min questioning cues from SLP, and second attempts by pt. SLP targeted functional therapy in a conversational manner - this was fostered by pt writing words he could not articulate correctly and SLP practicing those words with pt. SLP cont to model this type of practice for Cameron Hudson - as pt's schedule has ~9 hours of drive time/week that he practices with [facility]. SLP cont to encourage conversational practice at times other than car time. SLP reiterated strongly that pt must practice the target words afterwards for  maximum benefit for practice. Pt wife asks SLP about strategies for pronoun expression. SLP encouraged pt to slow his rate of speech and told wife/pt that pronouns may well be one thing that pt will need to pay special attention to from this point onward with his spontaneous speech.  09/28/22: Pt used written augmentation extensively today in 15-minutes simple to mod complex conversation with SLP and was nearly functional with SLP semantic and articulatory cues. SLP educated wife how to use semantic cues with pt today via model and direct instruction. Pt cont to work with constant therapy at home 40-70 minutes/day.  09/27/22:SLP provided CES today for both pt and Cameron Hudson; Results above. SLP targeted functional language with salient and high interest topics in simple conversation. Pt was nearly functional but req'd SLP questioning cues rarely. Pt augmented verbal communication with written communication on a pad successfully 90% of the time. Pt's spontaneous speech is more grammatically correct and more prevalent today than in previous sessions.   SLP then linked clinician CT to pt's CT profile.   09/20/22: SLP targeted expressive and receptive language. SLP modeled practice for home tasks and provided wife with suggested cues for target word. Pt benefited today from semantic cues. Pt filled in 2 letters for body parts with 4/6 success and incr'd to 6/6 with min A. Written cues from pt did not appear to assist verbal expression. Neologism and semantic errors prevalent, with incr'd awareness than Monday.  09/18/22:QAB completed today. Score above. Pt with less frustration today - self-calming behavior, "It's ok it's ok" x4. SLP provided education on [facility] telling pt/wife that pt could work on it if frustration level does not inhibit ability to complete. Homework to say family names, other automatic speech, and cloze phrases/sentences.  SLP provided clinic number for [facility] and suggested wife call to obtain  information about possible summer sessions.  09/14/22: Expressed frustration re: hwk for sentence completions. Demo'd good comprehension of task, correct completion of sentences, and accurate writing; however, usual verbal reading errors noted, particularly content words. Reduced awareness of errors demonstrated initially, which improved  throughout session. SLP educated rationale of task, including task hierarchy (starting with easier, more automatic tasks and then advancing) and targeted deficits (comprehension and expression). Writing noted as relative strength with pt often writing key words to optimize listener comprehension given usual neologisms and paraphasias. SLP targeted verbal expression of personally relevant words from his personal notebook. SLP demonstrated recommended cueing hierarchy to maximize patient performance. Pt benefited from usual verbal/visual modeling fading to visual modeling (mouthing sounds) to saying word in response to a question to saying word independently with intermittent choral practice needed. See patient instructions for additional education and HEP. Briefly discussed auditory comprehension strategies (slower rate, repetition, rephrasing, confirmation); additional education warranted.   PATIENT EDUCATION: Education details: see above Person educated: Patient and Spouse Education method: Explanation, Demonstration, Verbal cues, and Handouts Education comprehension: verbalized understanding, verbal cues required, and needs further education   GOALS: Goals reviewed with patient? Yes  SHORT TERM GOALS: Target date: 10/15/22  Pt will name average 8 items in salient, practical categories in 2 sessions Baseline: 10/11/22 Goal status: MET  2.  Pt will communicate basic needs/wants with use of compensatory strategies in 3 sessions Baseline: 10/04/22, 10/11/22 Goal status: MET  3.  Pt will demo understanding of sentence stimuli in structured tasks in 3  sessions Baseline: 10/04/22, 10/11/22 Goal status: MET  4.  With extra time, pt will demo understanding of 2-spoken sentence stimuli in 3 sessions Baseline:  Goal status: MET   LONG TERM GOALS: Target date: 11/22/22  Pt will name average 8 items in simple categories (non-personal for pt) in 3 sessions Baseline:  Goal status: IN PROGRESS  2.  Pt will produce MLU of >6.5 average in 3-minute speech sample  Baseline:  Goal status: IN PROGRESS  3.  Pt family will demo appropriate cueing for pt's receptive and expressive language in 5 sessions Baseline:  Goal status: IN PROGRESS  4.  Pt will score higher on PROM than on initial reading in first 2 sessions Baseline:  Goal status: IN PROGRESS   ASSESSMENT:  CLINICAL IMPRESSION: Patient is a 57 y.o. male who was seen today for treatment of expressive and receptive language. Verbal expression cont to contain neologism and phonemic paraphasias. 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  OBJECTIVE IMPAIRMENTS: include expressive language, receptive language, aphasia, and dysphagia. These impairments are limiting patient from return to work, household responsibilities, ADLs/IADLs, and effectively communicating at home and in community. Factors affecting potential to achieve goals and functional outcome are severity of impairments and limited insurance coverage for ST . Patient will benefit from skilled SLP services to address above impairments and improve overall function.  REHAB POTENTIAL: Good  PLAN:  SLP FREQUENCY: 1-2x/week  SLP DURATION:  20 sessions  PLANNED INTERVENTIONS: Language facilitation, Environmental controls, Trials of upgraded texture/liquids, Cueing hierachy, Internal/external aids, Functional tasks, Multimodal communication approach, SLP instruction and feedback, Compensatory strategies, and Patient/family education    [facility], CCC-SLP 11/16/2022, 8:37 AM

## 2022-11-16 NOTE — Therapy (Signed)
OUTPATIENT OCCUPATIONAL THERAPY NEURO  TREATMENT NOTE Patient Name: Cameron Hudson MRN: [id number] DOB:[birth year], 57 y.o., male Today's Date: 11/22/2022  PCP: Creola Corn, MD REFERRING PROVIDER: Creola Corn, MD   END OF SESSION:  OT End of Session - 11/22/22 1617     Visit Number 13    Number of Visits 17    Date for OT Re-Evaluation 12/20/22    Authorization Type [employer] / [employer] [id number]    Authorization - Number of Visits 20   OT/PT combined   OT Start Time 1618    OT Stop Time 1702    OT Time Calculation (min) 44 min    Activity Tolerance Patient tolerated treatment well;No increased pain;Patient limited by fatigue    Behavior During Therapy [facility] for tasks assessed/performed             Past Medical History:  Diagnosis Date   Allergy    Asthma    CAD S/P percutaneous coronary angioplasty 06/20/2017   Inferior STEMI - 100% dRCA --> DES PCI with Synergy 3.0 x 24 (3.3 mm). Mild 10-30% disease in p-mRCA, Cx & LAD.  EF Normal.    Hemorrhoids    Hx of adenomatous polyp of colon 08/01/2019   Hyperlipidemia    IBS (irritable bowel syndrome)    Personal history of colonic polyps 2004   12 mm adenoma   Seasonal allergies    STEMI involving oth coronary artery of inferior wall (HCC) 06/20/2017   12/18 PCI/DESx1 to dRCA, normal EF   Past Surgical History:  Procedure Laterality Date   CHOLECYSTECTOMY N/A 11/28/2019   Procedure: LAPAROSCOPIC CHOLECYSTECTOMY;  Surgeon: Abigail Miyamoto, MD;  Location: [facility];  Service: General;  Laterality: N/A;   COLONOSCOPY     COLONOSCOPY W/ POLYPECTOMY  08/27/2002; 08/30/2005; 01/06/2011   12mm adenoma, diverticulosis, hemorrhoids; normal;  diverticulosis and small hemorrhoids   CORONARY STENT INTERVENTION N/A 06/20/2017   Procedure: CORONARY STENT INTERVENTION;  Surgeon: Lennette Bihari, MD;  Location: [facility];  Service: Cardiovascular;  Laterality: N/A;   LEFT HEART CATH AND CORONARY ANGIOGRAPHY N/A 06/20/2017    Procedure: LEFT HEART CATH AND CORONARY ANGIOGRAPHY;  Surgeon: Lennette Bihari, MD;  Location: [facility];  Service: Cardiovascular;  Laterality: N/A;   NASAL SINUS SURGERY  Dec 2011   POLYPECTOMY     Patient Active Problem List   Diagnosis Date Noted   Hx of adenomatous polyp of colon 08/01/2019   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   IFG (impaired fasting glucose) 08/11/2017   Hypogonadism, male 08/11/2017   Statin intolerance 08/09/2017   Encounter for long-term (current) use of medications 08/09/2017   Hyperlipidemia with target low density lipoprotein (LDL) cholesterol less than 70 mg/dL 40/98/1191   STEMI involving oth coronary artery of inferior wall (HCC) 06/20/2017   CAD S/P percutaneous coronary angioplasty 06/20/2017   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   Acute pain of right shoulder 10/07/2016   Radicular pain 08/09/2016   Seasonal allergies 07/31/2013   History of chronic sinusitis 05/11/2012   Allergy 05/11/2012   Asthma, exercise induced 10/09/2010   Chronic sinusitis 10/09/2010   Perennial allergic rhinitis with seasonal variation 10/09/2010    ONSET DATE: 09/06/22  REFERRING DIAG: I63.9 (ICD-10-CM) - CVA (cerebral vascular accident)  THERAPY DIAG:  Other lack of coordination  Hemiplegia and hemiparesis following cerebral infarction affecting right non-dominant side (HCC)  Other disturbances of skin sensation  Attention and concentration deficit  Rationale for Evaluation and Treatment: Rehabilitation  PERTINENT HISTORY: Coronary artery disease, s/p inf STEMI and DES to occluded RCA in 05/2017  PRECAUTIONS: None  WEIGHT BEARING RESTRICTIONS: No   SUBJECTIVE:   SUBJECTIVE STATEMENT: Pt reports doing slightly bette rwith everything but still having a pain at the base of Rt thumb and dorsally over base of Rt thumb with some activities.   Pt accompanied by: self and significant other (spouse Cameron Hudson)   PAIN:  Are you having pain?  None at  rest now- mild nerve paresthesia in Rt arm    FALLS: Has patient fallen in last 6 months? Yes. Number of falls 1 - collapsed with onset of CVA  LIVING ENVIRONMENT: Lives with: lives with their family (82 and 35 yo daughters) Lives in: House/apartment Stairs: Yes: Internal: able to remain on main level will full bedroom/bathroom, does have 2nd floor - children's bedrooms are upstairs steps; on right going up and External: 4-5 steps; on right going up Has following equipment at home: None  PLOF: Independent and Independent with basic ADLs, sees pt's 2 days per week and admin 1.5 days  PATIENT GOALS: to make sure that I don't need OT   OBJECTIVE: (All objective assessments below are from initial evaluation on: 09/13/22 unless otherwise specified.)   HAND DOMINANCE: Left  ADLs: Overall ADLs: Independent with bathing/dressing and bathroom transfers, no difficulty with clothing fasteners Transfers/ambulation related to ADLs: Independent Equipment: none  IADLs: Pt just returned home from hospital yesterday, has not yet attempted any IADLs.  Pt reports that he feels he could do them, but has not had the opportunity to attempt. Medication management: Spouse is currently assisting with dispensing medications  Financial management: Did go in to the clinic to do some payroll with supervision Handwriting: 100% legible  MOBILITY STATUS: Independent  POSTURE COMMENTS:  No Significant postural limitations  ACTIVITY TOLERANCE: Activity tolerance: WFL for tasks assessed on eval  FUNCTIONAL OUTCOME MEASURES: FOTO: 91, predicted 98 at d/c  UPPER EXTREMITY ROM:  11/22/22:  Rt wrist flex: 60 (Lt: 72) , Ext: 48  (Lt: 72)    UPPER EXTREMITY MMT:    11/22/22: Rt wrist ext 4/5 compared to 5/5 in Lt wrist (was 5/5/ for flexion in both)    HAND FUNCTION: Grip strength: Right: 90 lbs; Left: 100 lbs, Lateral pinch: Right: 23 lbs, Left: 21 lbs, and 3 point pinch: Right: 20 lbs, Left: 17  lbs  COORDINATION: Finger Nose Finger test: R mild decreased coordination and speed compared to L 9 Hole Peg test: Right: 28.78 sec; Left: 20.10 sec  SENSATION: Slight decreased sensation in R hand, along ulnar distribution  COGNITION: Overall cognitive status: Within functional limits for tasks assessed and requires slowing with verbal cues to allow for increased time to process. Pt with difficulty with multi-step directions  VISION: Subjective report: no issues Baseline vision: Wears glasses for reading only  PRAXIS: 11/02/22: None noted now   Objective: 11/22/22: static 2-pt discrimination in rt hand ulnar and median nerves ~98mm compared to 3-4 in Lt hand; radial nerve in Lt hand 9-4mm by dorsal thumb and UNABLE in Rt hand.  Semmes-Weinstein was 2.83 in Lt radial nerve vs  3.61 in Rt radial nerve; wrist proprioception off by 8* consistently in Rt wrist (Lt wrist normal= 3* or less)     TODAY'S TREATMENT:  11/22/22: OT does some detailed testing of the nerves of both upper extremities and the findings indicate that the radial nerve is the most affected in the right nondominant arm.  This also seems to link up with the pain he feels at the dorsal surface of the thumb.  Additionally his proprioceptive sense was off in the right wrist compared to the left, and his right wrist was also significantly tight.  He was given new exercises as bolded below to help these issues and additionally was recommended to perform proprioceptive activities of wrist (with cell phone games, bouncing a ball on plate or pickleball on a pickleball mallet, reaching forward and touching at [place] on the wall with eyes closed, and even performing boxing activities.  He tries each of these with OT, except for boxing activity which was just verbalized.  He states feeling good nerve stretches from the radial nerve glide and feeling like these things will help him.   These things activate the goli tendon organs and different proprioceptive structures in the body to increase awareness and ability.  Exercises discussed today  - Standing Radial Nerve Glide  - 4-6 x daily - 1 sets - 10-15 reps - Seated Wrist Flexion Stretch  - 3-4 x daily - 3 reps - 15 second  hold - Wrist Prayer Stretch  - 3 x daily - 3 reps - 15 sec hold +wrist and arm proprioceptive activities (verbalized and demo's to pt)     11/08/22 Self-care: large portion of session involved discussing upcoming d/c vs recertification with consideration of visit limit. Encouraged pt to f/u with PCP in regards to increased pain in LUE, discussed nerve pain.  Pt just received mini-massager with plans to utilize in hand per instruction from previous OT.  Reviewed homunculus in regards to impaired sensation and impaired motor control. Pt pulled up image of his MRI to compare with homunculus.   9 hole peg: 26.16 seconds and 25.15 sec Stereognosis: able to correctly identify 6/6 items with vision occluded.  Pt demonstrating with toothpick impaired and absent sensation in L palm and finger tips respectively.   Dual tasking: pt continues to report decreased ability to engage in dual tasking, also demonstrating difficulty when in clinic as pt having to stop activities to engage in conversation or listen to instructions due to decreased ability to alternate or divide attention.  Encouraged pt to attempt simple podcasts during routine household tasks to allow for increased challenge. UE ROM: OT instructed pt in ulnar and median nerve gliding.  Pt completing each x5 with reports of min tightness at end ranges.  OT instructed pt in IYTA exercise completing bilaterally in standing with focus on quality of movement and stretching.  Pt reports tightness bilaterally.  Encouraged pt to balance stretching with strengthening.   PATIENT EDUCATION: Education details: ongoing condition specific education Person educated: Patient  and Spouse Education method: Explanation Education comprehension: verbalized understanding  HOME EXERCISE PROGRAM: Access Code: [email or web address] URL: [email or web address] Date: 11/22/2022   GOALS: Goals reviewed with patient? Yes  SHORT TERM GOALS: Target date: 10/13/22  Pt will be Independent with coordination HEP to increase independence with ADLs/IADLs Baseline: Goal status: MET - 10/11/22  2.  Pt will complete table top scanning activity without cues, demonstrating improved sustained attention to task. Baseline:  Goal status: MET - 10/11/22  3.  Pt will complete dual task activity in mild distracting environment with 90% accuracy. Baseline:  Goal status: IN PROGRESS   LONG TERM GOALS: Target date: 11/10/22  Pt will demonstrate improved fine motor coordination for ADLs as evidenced by decreasing 9 hole peg test score for RUE by 5 secs Baseline: Right: 28.78 sec; Left: 20.10 sec Goal status: NOT MET - 25.65 sec  2.  Pt will be able to engage in dual task activity with 100% accuracy to demonstrate increase awareness, organization, and sequencing. Baseline:  Goal status: NOT MET  3.  Pt will complete a moderately challenging bill paying/computer task <2 errors due to aphasia. Baseline:  Goal status: MET  4.  Pt will complete a 3 step task to carryover to IADL/work tasks with <1 cue for recall/sequencing. Baseline:  Goal status: REVISED  NEW LONG TERM GOALS: Target date: 12/20/22  Pt will demonstrate improved fine motor coordination for ADLs as evidenced by decreasing 9 hole peg test score for RUE by 5 secs Baseline: Right: 28.78 sec; Left: 20.10 sec R: 25.65 on 11/08/22 Goal status: INITIAL  2.  Pt will be able to engage in dual task activity with 90% accuracy to demonstrate increase awareness, organization, and sequencing. Baseline:  Goal status: INITIAL  3.  Pt will be independent in LUE ROM/stretch HEP to improve quality of movement with IADLs (meal prep,  hanging laundry, etc). Baseline: Goal status: INITIAL    ASSESSMENT:  CLINICAL IMPRESSION: 11/22/22: Today it was determined through objective findings that his radial nerve is functioning diminished in the right arm compared to the left, his proprioception sense is off in the right arm compared to the left, and his wrist is tighter in the right arm compared to the left.  These things were addressed with new exercises.  We will continue plan of care    PLAN:  OT FREQUENCY: every other week  OT DURATION: 6 weeks (planning for 4 weeks, however due to scheduling conflicts will ask for 6 weeks)  PLANNED INTERVENTIONS: self care/ADL training, therapeutic exercise, therapeutic activity, neuromuscular re-education, functional mobility training, electrical stimulation, ultrasound, compression bandaging, moist heat, cryotherapy, patient/family education, cognitive remediation/compensation, psychosocial skills training, energy conservation, coping strategies training, DME and/or AE instructions, and Dry needling  CONSULTED AND AGREED WITH PLAN OF CARE: Patient and family member/caregiver  PLAN FOR NEXT SESSION: Check on new exercises for radial nerve gliding as well as wrist stretches and proprioceptive activities.  Hand strengthening as well as wrist and forearm strengthening can be accomplished whenever not painful as well and shoulder range of motion can also be addressed if this is an issue (he does have a history of an old labral tear in the right shoulder.)   Fannie Knee, OTR/L 11/22/2022, 5:32 PM

## 2022-11-22 ENCOUNTER — Ambulatory Visit: Payer: PRIVATE HEALTH INSURANCE

## 2022-11-22 ENCOUNTER — Encounter: Payer: Self-pay | Admitting: Rehabilitative and Restorative Service Providers"

## 2022-11-22 ENCOUNTER — Ambulatory Visit: Payer: PRIVATE HEALTH INSURANCE | Admitting: Rehabilitative and Restorative Service Providers"

## 2022-11-22 DIAGNOSIS — I69353 Hemiplegia and hemiparesis following cerebral infarction affecting right non-dominant side: Secondary | ICD-10-CM | POA: Diagnosis not present

## 2022-11-22 DIAGNOSIS — R278 Other lack of coordination: Secondary | ICD-10-CM

## 2022-11-22 DIAGNOSIS — R208 Other disturbances of skin sensation: Secondary | ICD-10-CM

## 2022-11-22 DIAGNOSIS — R41840 Attention and concentration deficit: Secondary | ICD-10-CM

## 2022-11-22 DIAGNOSIS — R4701 Aphasia: Secondary | ICD-10-CM

## 2022-11-22 NOTE — Therapy (Signed)
OUTPATIENT SPEECH LANGUAGE PATHOLOGY APHASIA TREATMENT   Patient Name: Cameron Hudson MRN: [id number] DOB:December 19, 1965, 57 y.o., male Today's Date: 11/22/2022  PCP: Creola Corn, MD REFERRING PROVIDER: [facility] MD Timothy Lasso- Doc)  END OF SESSION:  End of Session - 11/22/22 1535     Visit Number 13    Number of Visits 17    Date for SLP Re-Evaluation 11/22/22    SLP Start Time 1535    SLP Stop Time  1615    SLP Time Calculation (min) 40 min    Activity Tolerance Patient tolerated treatment well                   Past Medical History:  Diagnosis Date   Allergy    Asthma    CAD S/P percutaneous coronary angioplasty 06/20/2017   Inferior STEMI - 100% dRCA --> DES PCI with Synergy 3.0 x 24 (3.3 mm). Mild 10-30% disease in p-mRCA, Cx & LAD.  EF Normal.    Hemorrhoids    Hx of adenomatous polyp of colon 08/01/2019   Hyperlipidemia    IBS (irritable bowel syndrome)    Personal history of colonic polyps 2004   12 mm adenoma   Seasonal allergies    STEMI involving oth coronary artery of inferior wall (HCC) 06/20/2017   12/18 PCI/DESx1 to dRCA, normal EF   Past Surgical History:  Procedure Laterality Date   CHOLECYSTECTOMY N/A 11/28/2019   Procedure: LAPAROSCOPIC CHOLECYSTECTOMY;  Surgeon: Abigail Miyamoto, MD;  Location: [facility];  Service: General;  Laterality: N/A;   COLONOSCOPY     COLONOSCOPY W/ POLYPECTOMY  08/27/2002; 08/30/2005; 01/06/2011   12mm adenoma, diverticulosis, hemorrhoids; normal;  diverticulosis and small hemorrhoids   CORONARY STENT INTERVENTION N/A 06/20/2017   Procedure: CORONARY STENT INTERVENTION;  Surgeon: Lennette Bihari, MD;  Location: [facility];  Service: Cardiovascular;  Laterality: N/A;   LEFT HEART CATH AND CORONARY ANGIOGRAPHY N/A 06/20/2017   Procedure: LEFT HEART CATH AND CORONARY ANGIOGRAPHY;  Surgeon: Lennette Bihari, MD;  Location: [facility];  Service: Cardiovascular;  Laterality: N/A;   NASAL SINUS  SURGERY  Dec 2011   POLYPECTOMY     Patient Active Problem List   Diagnosis Date Noted   Hx of adenomatous polyp of colon 08/01/2019   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   IFG (impaired fasting glucose) 08/11/2017   Hypogonadism, male 08/11/2017   Statin intolerance 08/09/2017   Encounter for long-term (current) use of medications 08/09/2017   Hyperlipidemia with target low density lipoprotein (LDL) cholesterol less than 70 mg/dL 47/82/9562   STEMI involving oth coronary artery of inferior wall (HCC) 06/20/2017   CAD S/P percutaneous coronary angioplasty 06/20/2017   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  Pt accompanied by: self   PERTINENT HISTORY: HLD, CAD,S/P one stent  PAIN: Are you having pain? Yes: NPRS scale: 2/10 Pain location: rt hand Pain description: constant, pins and needles  FALLS: Has patient fallen in last 6 months?  No  PATIENT GOALS:  Improve language function  OBJECTIVE:   DIAGNOSTIC FINDINGS: CT Head 09/06/22 Id'd Left MCA occlusion, (S/P thrombectomy performed).   PATIENT REPORTED OUTCOME MEASURES (PROM): Communication Effectiveness Survey: (CES) :Pt filled out CES  and scored himself 13/32 (higher numbers indicate better QOL/effective communication). "1" were scored for participating in conversation with strangers in a quiet place, conversing with a stranger over the telephone, conversing in a noisy environment, and having a conversation with  someone at a distance. [name] scored pt 10/32, with "1" for participating in a conversation with strangers in a quiet place, with a familiar person over the telephone, with a stranger over the telephone, in a noisy environment, with a friend when emotionally upset or angry, and having a conversation with someone at a distance.. Pt's higher score likely indicates his awareness of errors is not accurate due to his aphasia.   TODAY'S TREATMENT:                                                                                                                                         11/22/22: SLP targeted improved verbal expression with pt with salient topics and functional topics, using verb-based sentence generation with pt-written cues to increase accuracy of verbal output. Pt will cont to work on his own at home.   11/15/22: SLP had simple-mod complex conversation of 5 minutes with pt and req'd occasional min questioning cues and pt writing error words on notepad. This was 99% successful. Neologism noted. In structured practice, SLP assisted pt generate two more entries into his VNEST, as he had only completed one/three. Pt with verb tense errors, rarely, and left out functor words in  his sentence expansion. Targeting precise verbal expression, SLP used action pictures and pt named the verb, and then the other present tense form, and then the past tense form. He then expanded the sentence to include a subject and object. Occasionally pt added expanded the sentence, with good success.  11/08/22: Pt explained that [facility] may be running an aphasia camp this summer, functionally. SLP educated pt and wife about V-NEST for semantic coherence in verbal and written language, as a self-cueing technique, and as a strategy for error awareness. Pt wrote subject and object with independence, triads with independence, and expanded sentence with occasional min A for functor words. Pt tearful with errors x1. SLP reassured pt that  he spoke sentence correctly but wrote incorrect word and that practice will improve this area. Told pt to start by completing one/day and incr as he is comfortable.  10/25/22: Pt asked about writing skills and SLP had pt show SLP his texts. SLP copied some tsks for pt and explained them to pt/wife. Writing has agrammatism, largely. Homework is to read proper English sentences and note the differences.  10/18/22: SLP focused on practice today with expressive speech with using compensations like phone (map and photos apps). Pt's message was 98%  functionally comprehended by SLP.  Stressed to pt/wife to have the words [name] knows [place] desires to say provided with first 2 phonemes by [name] and then increasing as necessary the sounds spoken to him. Then pt should write and say these words 5x - he did so today with "group project" and after 45 seconds pt told SLP perfectly.   10/11/22: SLP targeted salient and meaningful language with Ferrel today, engaging in simple conversation with some rare mod complex conversation which again was functional with using written cues. SLP cont'd to then return to error words and assist pt production - success 25% independently and this incr'd to 50% with mod/max A. Simple divergent naming task with basic category (colors) completed with pt generating 7 colors independently without any aphasic errors. He req'd mod A to generate 4 more and needed max A for "teal" which pt initiated and used compensatory strategy of description saying, "[facility] color".   10/04/22: Pt functionally told SLP about the weekend with rare min questioning cues from SLP, and second attempts by pt. SLP targeted functional therapy in a conversational manner - this was fostered by pt writing words he could not articulate correctly and SLP practicing those words with pt. SLP cont to model this type of practice for [name] - as pt's schedule has ~9 hours of drive time/week that he practices with [facility]. SLP cont to  encourage conversational practice at times other than car time. SLP reiterated strongly that pt must practice the target words afterwards for maximum benefit for practice. Pt wife asks SLP about strategies for pronoun expression. SLP encouraged pt to slow his rate of speech and told wife/pt that pronouns may well be one thing that pt will need to pay special attention to from this point onward with his spontaneous speech.  09/28/22: Pt used written augmentation extensively today in 15-minutes simple to mod complex conversation with SLP and was nearly functional with SLP semantic and articulatory cues. SLP educated wife how to use semantic cues with pt today via model and direct instruction. Pt cont to work with constant therapy at home 40-70 minutes/day.  09/27/22:SLP provided CES today for both pt and [name]; Results above. SLP targeted functional language with salient and high interest topics in simple conversation. Pt was nearly functional but req'd SLP questioning cues rarely. Pt augmented verbal communication with written communication on a pad successfully 90% of the time. Pt's spontaneous speech is more grammatically correct and more prevalent today than in previous sessions.   SLP then linked clinician CT to pt's CT profile.   09/20/22: SLP targeted expressive and receptive language. SLP modeled practice for home tasks and provided wife with suggested cues for target word. Pt benefited today from semantic cues. Pt filled in 2 letters for body parts with 4/6 success and incr'd to 6/6 with min A. Written cues from pt did not appear to assist verbal expression. Neologism and semantic errors prevalent, with incr'd awareness than Monday.  09/18/22:QAB completed today. Score above. Pt with less frustration today - self-calming behavior, "It's ok it's ok" x4. SLP provided education on [facility] telling pt/wife that pt could work on it if frustration level does not inhibit ability to complete. Homework to  say family names, other automatic speech, and cloze phrases/sentences.  SLP provided clinic number for [facility] and suggested wife call to obtain information about possible summer sessions.  09/14/22: Expressed frustration re: hwk for sentence completions. Demo'd good comprehension of task, correct completion of sentences, and accurate  writing; however, usual verbal reading errors noted, particularly content words. Reduced awareness of errors demonstrated initially, which improved throughout session. SLP educated rationale of task, including task hierarchy (starting with easier, more automatic tasks and then advancing) and targeted deficits (comprehension and expression). Writing noted as relative strength with pt often writing key words to optimize listener comprehension given usual neologisms and paraphasias. SLP targeted verbal expression of personally relevant words from his personal notebook. SLP demonstrated recommended cueing hierarchy to maximize patient performance. Pt benefited from usual verbal/visual modeling fading to visual modeling (mouthing sounds) to saying word in response to a question to saying word independently with intermittent choral practice needed. See patient instructions for additional education and HEP. Briefly discussed auditory comprehension strategies (slower rate, repetition, rephrasing, confirmation); additional education warranted.   PATIENT EDUCATION: Education details: see above Person educated: Patient and Spouse Education method: Explanation, Demonstration, Verbal cues, and Handouts Education comprehension: verbalized understanding, verbal cues required, and needs further education   GOALS: Goals reviewed with patient? Yes  SHORT TERM GOALS: Target date: 10/15/22  Pt will name average 8 items in salient, practical categories in 2 sessions Baseline: 10/11/22 Goal status: MET  2.  Pt will communicate basic needs/wants with use of compensatory strategies in 3  sessions Baseline: 10/04/22, 10/11/22 Goal status: MET  3.  Pt will demo understanding of sentence stimuli in structured tasks in 3 sessions Baseline: 10/04/22, 10/11/22 Goal status: MET  4.  With extra time, pt will demo understanding of 2-spoken sentence stimuli in 3 sessions Baseline:  Goal status: MET   LONG TERM GOALS: Target date: 11/22/22  Pt will name average 8 items in simple categories (non-personal for pt) in 3 sessions Baseline:  Goal status: IN PROGRESS  2.  Pt will produce MLU of >6.5 average in 3-minute speech sample  Baseline:  Goal status: IN PROGRESS  3.  Pt family will demo appropriate cueing for pt's receptive and expressive language in 5 sessions Baseline: 11/22/22 Goal status: IN PROGRESS  4.  Pt will score higher on PROM than on initial reading in first 2 sessions Baseline:  Goal status: IN PROGRESS   ASSESSMENT:  CLINICAL IMPRESSION: Patient is a 57 y.o. male who was seen today for treatment of expressive and receptive language. See today's treatment for more  details. Verbal expression cont to contain neologism and phonemic paraphasias. Pt needs excellent speech and language skills due to social and familial responsibilities.  OBJECTIVE IMPAIRMENTS: include expressive language, receptive language, aphasia, and dysphagia. These impairments are limiting patient from return to work, household responsibilities, ADLs/IADLs, and effectively communicating at home and in community. Factors affecting potential to achieve goals and functional outcome are severity of impairments and limited insurance coverage for ST . Patient will benefit from skilled SLP services to address above impairments and improve overall function.  REHAB POTENTIAL: Good  PLAN:  SLP FREQUENCY: 1-2x/week  SLP DURATION:  20 sessions  PLANNED INTERVENTIONS: Language facilitation, Environmental controls, Trials of upgraded texture/liquids, Cueing hierachy, Internal/external aids, Functional  tasks, Multimodal communication approach, SLP instruction and feedback, Compensatory strategies, and Patient/family education    [facility], CCC-SLP 11/22/2022, 3:35 PM

## 2022-11-27 ENCOUNTER — Encounter: Payer: PRIVATE HEALTH INSURANCE | Admitting: Occupational Therapy

## 2022-12-14 ENCOUNTER — Inpatient Hospital Stay: Payer: PRIVATE HEALTH INSURANCE | Attending: Nurse Practitioner | Admitting: Oncology

## 2022-12-14 ENCOUNTER — Ambulatory Visit: Payer: PRIVATE HEALTH INSURANCE | Attending: Internal Medicine | Admitting: Occupational Therapy

## 2022-12-14 VITALS — BP 117/72 | HR 67 | Temp 98.2°F | Resp 18 | Ht 69.0 in | Wt 159.2 lb

## 2022-12-14 DIAGNOSIS — I2699 Other pulmonary embolism without acute cor pulmonale: Secondary | ICD-10-CM | POA: Diagnosis not present

## 2022-12-14 DIAGNOSIS — I252 Old myocardial infarction: Secondary | ICD-10-CM | POA: Diagnosis not present

## 2022-12-14 DIAGNOSIS — Z800 Family history of malignant neoplasm of digestive organs: Secondary | ICD-10-CM | POA: Insufficient documentation

## 2022-12-14 DIAGNOSIS — R278 Other lack of coordination: Secondary | ICD-10-CM | POA: Diagnosis present

## 2022-12-14 DIAGNOSIS — R208 Other disturbances of skin sensation: Secondary | ICD-10-CM | POA: Insufficient documentation

## 2022-12-14 DIAGNOSIS — Z86711 Personal history of pulmonary embolism: Secondary | ICD-10-CM | POA: Diagnosis present

## 2022-12-14 DIAGNOSIS — I69353 Hemiplegia and hemiparesis following cerebral infarction affecting right non-dominant side: Secondary | ICD-10-CM | POA: Insufficient documentation

## 2022-12-14 DIAGNOSIS — Z7901 Long term (current) use of anticoagulants: Secondary | ICD-10-CM | POA: Diagnosis not present

## 2022-12-14 DIAGNOSIS — Z86010 Personal history of colonic polyps: Secondary | ICD-10-CM | POA: Diagnosis not present

## 2022-12-14 DIAGNOSIS — E785 Hyperlipidemia, unspecified: Secondary | ICD-10-CM | POA: Insufficient documentation

## 2022-12-14 DIAGNOSIS — Z8673 Personal history of transient ischemic attack (TIA), and cerebral infarction without residual deficits: Secondary | ICD-10-CM | POA: Diagnosis not present

## 2022-12-14 NOTE — Therapy (Signed)
OUTPATIENT OCCUPATIONAL THERAPY NEURO  TREATMENT NOTE & Discharge Patient Name: Cameron Hudson MRN: [id number] DOB:[birth year], 57 y.o., male Today's Date: 12/14/2022  PCP: Creola Corn, MD REFERRING PROVIDER: Creola Corn, MD  OCCUPATIONAL THERAPY DISCHARGE SUMMARY  Visits from Start of Care: 14  Current functional level related to goals / functional outcomes: Pt has shown improvements in fine and gross motor control, increased awareness of compensatory strategies for impaired sensation and proprioception, and awareness of exercises to continue to address tightness in R shoulder and wrist. Pt to d/c from therapy today to save 6 covered visits if needed this year.     Remaining deficits: Impaired sensation and proprioception in nondominant RUE, R shoulder and wrist tightness   Education / Equipment: Coordination, ROM, strengthening HEP; compensatory strategies for impaired sensation  Patient agrees to discharge. Patient goals were met. Patient is being discharged due to meeting the stated rehab goals..     END OF SESSION:  OT End of Session - 12/14/22 1020     Visit Number 14    Number of Visits 17    Date for OT Re-Evaluation 12/20/22    Authorization Type [employer] / [employer] [id number]    Authorization - Number of Visits 20   OT/PT combined   OT Start Time 1015    OT Stop Time 1100    OT Time Calculation (min) 45 min    Activity Tolerance Patient tolerated treatment well;No increased pain;Patient limited by fatigue    Behavior During Therapy [facility] for tasks assessed/performed              Past Medical History:  Diagnosis Date   Allergy    Asthma    CAD S/P percutaneous coronary angioplasty 06/20/2017   Inferior STEMI - 100% dRCA --> DES PCI with Synergy 3.0 x 24 (3.3 mm). Mild 10-30% disease in p-mRCA, Cx & LAD.  EF Normal.    Hemorrhoids    Hx of adenomatous polyp of colon 08/01/2019   Hyperlipidemia    IBS (irritable bowel syndrome)    Personal history of colonic  polyps 2004   12 mm adenoma   Seasonal allergies    STEMI involving oth coronary artery of inferior wall (HCC) 06/20/2017   12/18 PCI/DESx1 to dRCA, normal EF   Past Surgical History:  Procedure Laterality Date   CHOLECYSTECTOMY N/A 11/28/2019   Procedure: LAPAROSCOPIC CHOLECYSTECTOMY;  Surgeon: Abigail Miyamoto, MD;  Location: [facility];  Service: General;  Laterality: N/A;   COLONOSCOPY     COLONOSCOPY W/ POLYPECTOMY  08/27/2002; 08/30/2005; 01/06/2011   12mm adenoma, diverticulosis, hemorrhoids; normal;  diverticulosis and small hemorrhoids   CORONARY STENT INTERVENTION N/A 06/20/2017   Procedure: CORONARY STENT INTERVENTION;  Surgeon: Lennette Bihari, MD;  Location: [facility];  Service: Cardiovascular;  Laterality: N/A;   LEFT HEART CATH AND CORONARY ANGIOGRAPHY N/A 06/20/2017   Procedure: LEFT HEART CATH AND CORONARY ANGIOGRAPHY;  Surgeon: Lennette Bihari, MD;  Location: [facility];  Service: Cardiovascular;  Laterality: N/A;   NASAL SINUS SURGERY  Dec 2011   POLYPECTOMY     Patient Active Problem List   Diagnosis Date Noted   Hx of adenomatous polyp of colon 08/01/2019   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   IFG (impaired fasting glucose) 08/11/2017   Hypogonadism, male 08/11/2017   Statin intolerance 08/09/2017   Encounter for long-term (current) use of medications 08/09/2017   Hyperlipidemia with target low density lipoprotein (LDL) cholesterol less than 70 mg/dL 40/98/1191  STEMI involving oth coronary artery of inferior wall (HCC) 06/20/2017   CAD S/P percutaneous coronary angioplasty 06/20/2017   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   Acute pain of right shoulder 10/07/2016   Radicular pain 08/09/2016   Seasonal allergies 07/31/2013   History of chronic sinusitis 05/11/2012   Allergy 05/11/2012   Asthma, exercise induced 10/09/2010   Chronic sinusitis 10/09/2010   Perennial allergic rhinitis with seasonal variation 10/09/2010    ONSET  DATE: 09/06/22  REFERRING DIAG: I63.9 (ICD-10-CM) - CVA (cerebral vascular accident)  THERAPY DIAG:  Hemiplegia and hemiparesis following cerebral infarction affecting right non-dominant side (HCC)  Other lack of coordination  Other disturbances of skin sensation  Rationale for Evaluation and Treatment: Rehabilitation  PERTINENT HISTORY: Coronary artery disease, s/p inf STEMI and DES to occluded RCA in 05/2017  PRECAUTIONS: None  WEIGHT BEARING RESTRICTIONS: No   SUBJECTIVE:   SUBJECTIVE STATEMENT: Pt reports having an appointment this morning; is still doing the "flossing" and ball exercises.  Pt accompanied by: self and significant other (spouse [name])   PAIN:  Are you having pain?  None at rest now- mild nerve paresthesia in Rt arm    FALLS: Has patient fallen in last 6 months? Yes. Number of falls 1 - collapsed with onset of CVA  LIVING ENVIRONMENT: Lives with: lives with their family (75 and 86 yo daughters) Lives in: House/apartment Stairs: Yes: Internal: able to remain on main level will full bedroom/bathroom, does have 2nd floor - children's bedrooms are upstairs steps; on right going up and External: 4-5 steps; on right going up Has following equipment at home: None  PLOF: Independent and Independent with basic ADLs, sees pt's 2 days per week and admin 1.5 days  PATIENT GOALS: to make sure that I don't need OT   OBJECTIVE: (All objective assessments below are from initial evaluation on: 09/13/22 unless otherwise specified.)   HAND DOMINANCE: Left  ADLs: Overall ADLs: Independent with bathing/dressing and bathroom transfers, no difficulty with clothing fasteners Transfers/ambulation related to ADLs: Independent Equipment: none  IADLs: Pt just returned home from hospital yesterday, has not yet attempted any IADLs.  Pt reports that he feels he could do them, but has not had the opportunity to attempt. Medication management: Spouse is currently assisting with  dispensing medications  Financial management: Did go in to the clinic to do some payroll with supervision Handwriting: 100% legible  MOBILITY STATUS: Independent  POSTURE COMMENTS:  No Significant postural limitations  ACTIVITY TOLERANCE: Activity tolerance: WFL for tasks assessed on eval  FUNCTIONAL OUTCOME MEASURES: FOTO: 91, predicted 98 at d/c  UPPER EXTREMITY ROM:  11/22/22:  Rt wrist flex: 60 (Lt: 72) , Ext: 48  (Lt: 72)    UPPER EXTREMITY MMT:    11/22/22: Rt wrist ext 4/5 compared to 5/5 in Lt wrist (was 5/5/ for flexion in both)    HAND FUNCTION: Grip strength: Right: 90 lbs; Left: 100 lbs, Lateral pinch: Right: 23 lbs, Left: 21 lbs, and 3 point pinch: Right: 20 lbs, Left: 17 lbs  COORDINATION: Finger Nose Finger test: R mild decreased coordination and speed compared to L 9 Hole Peg test: Right: 28.78 sec; Left: 20.10 sec 12/14/22: 9 hole peg: 23.4 sec  SENSATION: Slight decreased sensation in R hand, along ulnar distribution  COGNITION: Overall cognitive status: Within functional limits for tasks assessed and requires slowing with verbal cues to allow for increased time to process. Pt with difficulty with multi-step directions  VISION: Subjective report: no issues Baseline vision: Wears  glasses for reading only  PRAXIS: 11/02/22: None noted now   Objective: 11/22/22: static 2-pt discrimination in rt hand ulnar and median nerves ~40mm compared to 3-4 in Lt hand; radial nerve in Lt hand 9-39mm by dorsal thumb and UNABLE in Rt hand.  Semmes-Weinstein was 2.83 in Lt radial nerve vs  3.61 in Rt radial nerve; wrist proprioception off by 8* consistently in Rt wrist (Lt wrist normal= 3* or less)     TODAY'S TREATMENT:                                                              12/14/22 Engaged in discussion of progress towards goals, upcoming appt with hematologist as they continue to attempt to identify cause of stroke, and plan for d/c to save visits for remainder of  year as needed.  Pt currently engaging in HEP, gym workouts, and proprioceptive activities to continue to improve functional use of non-dominant RUE.   Therapeutic activity: engaged in modified "Clock yourself" with therapist calling out numbers at 1-2 second pace, completing multiple rounds of ~60 seconds.  Pt making 5 errors in 60 seconds with increasing errors as task continued on. Aphasia certainly impacting engagement in task as well as possible impact of decreased proprioception.  Reviewed radial nerve glide, wrist flexion and extension stretch. Sensation: reviewed compensatory techniques for impaired sensation as pt reports that he still had diminished sensation to heat. 9 hole peg test: 23.4    11/22/22: OT does some detailed testing of the nerves of both upper extremities and the findings indicate that the radial nerve is the most affected in the right nondominant arm.  This also seems to link up with the pain he feels at the dorsal surface of the thumb.  Additionally his proprioceptive sense was off in the right wrist compared to the left, and his right wrist was also significantly tight.  He was given new exercises as bolded below to help these issues and additionally was recommended to perform proprioceptive activities of wrist (with cell phone games, bouncing a ball on plate or pickleball on a pickleball mallet, reaching forward and touching at [place] on the wall with eyes closed, and even performing boxing activities.  He tries each of these with OT, except for boxing activity which was just verbalized.  He states feeling good nerve stretches from the radial nerve glide and feeling like these things will help him.  These things activate the goli tendon organs and different proprioceptive structures in the body to increase awareness and ability.  Exercises discussed today  - Standing Radial Nerve Glide  - 4-6 x daily - 1 sets - 10-15 reps - Seated Wrist Flexion Stretch  - 3-4 x daily - 3 reps -  15 second  hold - Wrist Prayer Stretch  - 3 x daily - 3 reps - 15 sec hold +wrist and arm proprioceptive activities (verbalized and demo's to pt)     11/08/22 Self-care: large portion of session involved discussing upcoming d/c vs recertification with consideration of visit limit. Encouraged pt to f/u with PCP in regards to increased pain in LUE, discussed nerve pain.  Pt just received mini-massager with plans to utilize in hand per instruction from previous OT.  Reviewed homunculus in regards to impaired sensation and impaired motor  control. Pt pulled up image of his MRI to compare with homunculus.   9 hole peg: 26.16 seconds and 25.15 sec Stereognosis: able to correctly identify 6/6 items with vision occluded.  Pt demonstrating with toothpick impaired and absent sensation in L palm and finger tips respectively.   Dual tasking: pt continues to report decreased ability to engage in dual tasking, also demonstrating difficulty when in clinic as pt having to stop activities to engage in conversation or listen to instructions due to decreased ability to alternate or divide attention.  Encouraged pt to attempt simple podcasts during routine household tasks to allow for increased challenge. UE ROM: OT instructed pt in ulnar and median nerve gliding.  Pt completing each x5 with reports of min tightness at end ranges.  OT instructed pt in IYTA exercise completing bilaterally in standing with focus on quality of movement and stretching.  Pt reports tightness bilaterally.  Encouraged pt to balance stretching with strengthening.   PATIENT EDUCATION: Education details: ongoing condition specific education Person educated: Patient and Spouse Education method: Explanation Education comprehension: verbalized understanding  HOME EXERCISE PROGRAM: Access Code: [email or web address] URL: [email or web address] Date: 11/22/2022   GOALS: Goals reviewed with patient? Yes  SHORT TERM GOALS: Target date:  10/13/22  Pt will be Independent with coordination HEP to increase independence with ADLs/IADLs Baseline: Goal status: MET - 10/11/22  2.  Pt will complete table top scanning activity without cues, demonstrating improved sustained attention to task. Baseline:  Goal status: MET - 10/11/22  3.  Pt will complete dual task activity in mild distracting environment with 90% accuracy. Baseline:  Goal status: IN PROGRESS   LONG TERM GOALS: Target date: 11/10/22  Pt will demonstrate improved fine motor coordination for ADLs as evidenced by decreasing 9 hole peg test score for RUE by 5 secs Baseline: Right: 28.78 sec; Left: 20.10 sec Goal status: NOT MET - 25.65 sec  2.  Pt will be able to engage in dual task activity with 100% accuracy to demonstrate increase awareness, organization, and sequencing. Baseline:  Goal status: NOT MET  3.  Pt will complete a moderately challenging bill paying/computer task <2 errors due to aphasia. Baseline:  Goal status: MET  4.  Pt will complete a 3 step task to carryover to IADL/work tasks with <1 cue for recall/sequencing. Baseline:  Goal status: REVISED  NEW LONG TERM GOALS: Target date: 12/20/22  Pt will demonstrate improved fine motor coordination for ADLs as evidenced by decreasing 9 hole peg test score for RUE by 5 secs Baseline: Right: 28.78 sec; Left: 20.10 sec R: 25.65 on 11/08/22 Goal status: Met - 23.4 on 12/14/22  2.  Pt will be able to engage in dual task activity with 90% accuracy to demonstrate increase awareness, organization, and sequencing. Baseline:  Goal status: Not met  3.  Pt will be independent in LUE ROM/stretch HEP to improve quality of movement with IADLs (meal prep, hanging laundry, etc). Baseline: Goal status: MET - 12/14/22    ASSESSMENT:  CLINICAL IMPRESSION: Pt is demonstrating good engagement in HEP with focus on shoulder and wrist ROM and proprioceptive activities.  Pt is demonstrating improvements in fine and gross  motor control as well as fewer errors with dual tasking, however still greatly limited due to aphasia.  Pt is able to continue with HEP at home and continued gym exercises, while working with outside speech therapy.  Reiterated need for additional referral from PCP if return to therapy is warranted due to decrease in function and/or  improved speech to allow for improved focus on dual tasking.  Pt and spouse report understanding and in agreement with d/c today.    PLAN:  OT FREQUENCY: every other week  OT DURATION: 6 weeks (planning for 4 weeks, however due to scheduling conflicts will ask for 6 weeks)  PLANNED INTERVENTIONS: self care/ADL training, therapeutic exercise, therapeutic activity, neuromuscular re-education, functional mobility training, electrical stimulation, ultrasound, compression bandaging, moist heat, cryotherapy, patient/family education, cognitive remediation/compensation, psychosocial skills training, energy conservation, coping strategies training, DME and/or AE instructions, and Dry needling  CONSULTED AND AGREED WITH PLAN OF CARE: Patient and family member/caregiver  PLAN FOR NEXT SESSION: D/C at this time - if return to therapy Check on new exercises for radial nerve gliding as well as wrist stretches and proprioceptive activities.  Hand strengthening as well as wrist and forearm strengthening can be accomplished whenever not painful as well and shoulder range of motion can also be addressed if this is an issue (he does have a history of an old labral tear in the right shoulder.)   Umaiza Matusik, OTR/L 12/14/2022, 10:30 AM

## 2022-12-14 NOTE — Progress Notes (Signed)
[facility] OFFICE PROGRESS NOTE   Diagnosis: Pulmonary embolism  INTERVAL HISTORY:   Dr. Thad Ranger returns as scheduled.  He continues apixaban anticoagulation.  No bleeding other than a bruise at the left lower leg with trauma.  No symptom of recurrent thrombosis.  He continues to have aphasia.  He is participating in speech therapy.  He is scheduled to see Dr. Isaiah Serge in July.  He recently saw Dr. Timothy Lasso.  The ferritin returned at 14.5 on 11/10/2022.  The stool Hemoccult was negative on 11/10/2022.  Additional labs included a hemoglobin of 13.9, hematocrit 42.1%, platelets 236,000, WBC 4.1, ANC 1.9, and ANC 1.9, and MCV 88.5.  The serum iron returned at 56, TIBC 358, and percent saturation 16 (15-55)  He is followed Dr. Leone Payor for colon cancer surveillance.  He last underwent a colonoscopy in 2021.  Polyps were removed from the sigmoid colon and cecum.  Tubular adenomas.  He is exercising regularly.  Objective:  Vital signs in last 24 hours:  Blood pressure 117/72, pulse 67, temperature 98.2 F (36.8 C), temperature source Oral, resp. rate 18, height 5\' 9"  (1.753 m), weight 159 lb 3.2 oz (72.2 kg), SpO2 99 %.   Lymphatics: No cervical, supraclavicular, or inguinal nodes.  "Shotty "bilateral axillary nodes Resp: Lungs clear bilaterally Cardio: Regular rate and rhythm GI: No hepatosplenomegaly, nontender, no mass Vascular: No leg edema   Lab Results:  Lab Results  Component Value Date   WBC 5.1 10/04/2022   HGB 13.5 10/04/2022   HCT 41.7 10/04/2022   MCV 87.8 10/04/2022   PLT 187 10/04/2022   NEUTROABS 2.7 10/04/2022    CMP  Lab Results  Component Value Date   NA 137 06/21/2017   K 3.9 06/21/2017   CL 106 06/21/2017   CO2 22 06/21/2017   GLUCOSE 115 (H) 06/21/2017   BUN 14 06/21/2017   CREATININE 0.98 06/21/2017   CALCIUM 8.1 (L) 06/21/2017   PROT 6.6 06/20/2017   ALBUMIN 3.8 06/20/2017   AST 36 06/20/2017   ALT 21 06/20/2017   ALKPHOS 66 06/20/2017    BILITOT 1.1 06/20/2017   GFRNONAA >60 06/21/2017   GFRAA >60 06/21/2017     Medications: I have reviewed the patient's current medications.   Assessment/Plan: DVT/PE/CVA with PFO 09/06/2022 09/07/2022 DRVVT positive (likely on heparin) Negative hypercoagulation panel including a negative lupus anticoagulant 10/04/2022 CVA, thrombectomy procedure 09/06/2022 Liposuction with fat implantation 09/02/2022 Laser power assisted liposculpture including chest, full abdomen, and full back,  plasma for skin tightening and pectoral lipofilling STEMI treated with PCI and drug-eluting stent of distal RCA 05/2017 Hypertension Hyperlipidemia Low ferritin 11/10/2022 History of colon polyps, last colonoscopy 08/01/2019 with removal of tubular adenomas    Disposition: Mr. Hed continues apixaban anticoagulation.  He is tolerating apixaban well.  No symptoms of recurrent venous thrombosis or new stroke symptoms.  I recommend he continue Coumadin until he is seen by Dr. Isaiah Serge.  The DVT/PE event appears to be related to the plastic surgery in March.  I would generally recommend 6 months of anticoagulation therapy.  He could continue post stroke therapy as recommended by neurology.  He was found to have a low ferritin level last month.  The stool was Hemoccult negative.  He has a history of colon polyps.  I will defer further evaluation of the low ferritin level to Drs. Timothy Lasso and Leone Payor. He will return for an office visit after he sees Dr. Isaiah Serge.  Thornton Papas, MD  12/14/2022  1:47 PM

## 2022-12-21 ENCOUNTER — Telehealth: Payer: Self-pay | Admitting: Internal Medicine

## 2022-12-21 DIAGNOSIS — D509 Iron deficiency anemia, unspecified: Secondary | ICD-10-CM

## 2022-12-21 NOTE — Telephone Encounter (Signed)
Encounter Diagnosis  Name Primary?   Iron deficiency anemia, unspecified iron deficiency anemia type Yes    On Eliquis - had stroke + DVT/PE and is hypercoaguable  Seeing [facility] hematology 7/30  We can find an 1130 or 350 appt in August to see me please

## 2022-12-22 NOTE — Telephone Encounter (Signed)
Left him a message to call me back. We can work him in 02/22/2023 at 3:50pm if that works for him.

## 2022-12-29 ENCOUNTER — Encounter: Payer: Self-pay | Admitting: Oncology

## 2022-12-29 NOTE — Telephone Encounter (Signed)
LVM for patient to call regarding his appointment

## 2023-01-24 ENCOUNTER — Telehealth: Payer: Self-pay | Admitting: Internal Medicine

## 2023-01-24 NOTE — Telephone Encounter (Signed)
Inbound call from patient and wife stating his hematologist suggested there might be a bleed coming from somewhere from recent lab results. Advised patient should have a colonoscopy and endoscopy as soon as possible. Patient and patient's wife requesting a call back to discuss further.

## 2023-01-25 NOTE — Telephone Encounter (Signed)
Mr Cameron Hudson came into the office today 01/25/2023 and Glendora Score, RN was unavailable.  She called and got his information to pass on to Dr Leone Payor to advise [place].

## 2023-01-25 NOTE — Telephone Encounter (Signed)
Spoke with patient & he stated he was returning a call from early July in regards to an appointment with Dr. Leone Payor (refer to phone note 12/21/22). Hematology is recommending he have an EGD & Colon as soon as possible. Recent lab work is available in epic from 01/23/23 with [facility]. Will send to Dr. Leone Payor to review as that appointment that was previously offered is not available. Pt has been advised that Dr. Leone Payor will return to office on Monday & once he has reviewed we will be in touch.

## 2023-01-28 NOTE — Telephone Encounter (Signed)
I have reviewed Dr. Isaiah Serge [facility] hematology notes) in addition to Dr. Kalman Drape notes.  It looks like an EGD and colonoscopy are appropriate and I am in favor of scheduling those -looks like could be done in September  He will need:  1) advice/clearance on holding Eliquis prior to procedures - would ask Dr. Truett Perna of hematology/oncology - asking for 2 days prior to procedures  2) schedule EGD and colonoscopy appointment(s)  3) let me know if he has any ?

## 2023-01-30 ENCOUNTER — Telehealth: Payer: Self-pay

## 2023-01-30 NOTE — Telephone Encounter (Signed)
Pt was made aware of Dr. Leone Payor recommendations: Pt was scheduled for an Upper Endoscopy/ Colonoscopy on 02/28/2023 at 2:30 PM with Dr. Leone Payor in the [facility]: Pt made aware Pt was scheduled for an Previsit on 02/16/2023 at 2:00 PM. Pt made aware. Location provided. Notes sent  to Dr. Truett Perna of hematology/oncology - asking for 2 days hold on pt Eliquis prior to procedures : Pt made aware Pt verbalized understanding with all questions answered.

## 2023-01-30 NOTE — Telephone Encounter (Addendum)
[facility] Pre-operative Risk Assessment     Request for surgical clearance:     Endoscopy Procedure  What type of surgery is being performed?     Upper Endoscopy/Colonoscopy  When is this surgery scheduled?     02/28/2023  What type of clearance is required ?   Medical/ Pharmacy  Are there any medications that need to be held prior to surgery and how long? Eliquis /2 days   Practice name and name of physician performing surgery?      [facility]/ Dr. Leone Payor  What is your office phone and fax number?      Phone- [phone]  Fax- [phone]  Anesthesia type (None, local, MAC, general) ?       MAC

## 2023-02-12 ENCOUNTER — Inpatient Hospital Stay: Payer: PRIVATE HEALTH INSURANCE | Attending: Nurse Practitioner | Admitting: Oncology

## 2023-02-12 VITALS — BP 143/76 | HR 68 | Temp 98.2°F | Resp 18 | Ht 69.0 in | Wt 159.6 lb

## 2023-02-12 DIAGNOSIS — E611 Iron deficiency: Secondary | ICD-10-CM | POA: Insufficient documentation

## 2023-02-12 DIAGNOSIS — Z86718 Personal history of other venous thrombosis and embolism: Secondary | ICD-10-CM | POA: Insufficient documentation

## 2023-02-12 DIAGNOSIS — Z8673 Personal history of transient ischemic attack (TIA), and cerebral infarction without residual deficits: Secondary | ICD-10-CM | POA: Insufficient documentation

## 2023-02-12 DIAGNOSIS — Z7901 Long term (current) use of anticoagulants: Secondary | ICD-10-CM | POA: Insufficient documentation

## 2023-02-12 DIAGNOSIS — I10 Essential (primary) hypertension: Secondary | ICD-10-CM | POA: Diagnosis not present

## 2023-02-12 DIAGNOSIS — E785 Hyperlipidemia, unspecified: Secondary | ICD-10-CM | POA: Insufficient documentation

## 2023-02-12 DIAGNOSIS — Z8719 Personal history of other diseases of the digestive system: Secondary | ICD-10-CM | POA: Diagnosis not present

## 2023-02-12 DIAGNOSIS — I2699 Other pulmonary embolism without acute cor pulmonale: Secondary | ICD-10-CM

## 2023-02-12 DIAGNOSIS — Z86711 Personal history of pulmonary embolism: Secondary | ICD-10-CM | POA: Diagnosis present

## 2023-02-12 DIAGNOSIS — D508 Other iron deficiency anemias: Secondary | ICD-10-CM | POA: Diagnosis not present

## 2023-02-12 DIAGNOSIS — R1310 Dysphagia, unspecified: Secondary | ICD-10-CM | POA: Diagnosis not present

## 2023-02-12 NOTE — Progress Notes (Signed)
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  He reports persistent expressive dysphagia, but this is improving.  No neurologic symptoms.  No bleeding.  He reports negative stool Hemoccult and urine evaluations by Dr. [name].  No symptom of recurrent thrombosis.  He is taking apixaban at a dose of 2.5 mg twice daily after evaluation by Dr. [name] on 01/23/2023. Dr. [name] recommends indefinite anticoagulation..   Objective:  Vital signs in last 24 hours:  Blood pressure (!) 143/76, pulse 68, temperature 98.2 F (36.8 C), temperature source Oral, resp. rate 18, height 5\' 9"  (1.753 m), weight 159 lb 9.6 oz (72.4 kg), SpO2 100%.    Lymphatics: No cervical, supraclavicular, or inguinal nodes.  "Shotty "bilateral axillary nodes Resp: Lungs clear bilaterally Cardio: Regular rate and rhythm GI: No hepatosplenomegaly Vascular: No leg edema   Lab Results:  Lab Results  Component Value Date   WBC 5.1 10/04/2022   HGB 13.5 10/04/2022   HCT 41.7 10/04/2022   MCV 87.8 10/04/2022   PLT 187 10/04/2022   NEUTROABS 2.7 10/04/2022    CMP  Lab Results  Component Value Date   NA 137 06/21/2017   K 3.9 06/21/2017   CL 106 06/21/2017   CO2 22 06/21/2017   GLUCOSE 115 (H) 06/21/2017   BUN 14 06/21/2017   CREATININE 0.98 06/21/2017   CALCIUM 8.1 (L) 06/21/2017   PROT 6.6 06/20/2017   ALBUMIN 3.8 06/20/2017   AST 36 06/20/2017   ALT 21 06/20/2017   ALKPHOS 66 06/20/2017   BILITOT 1.1 06/20/2017   GFRNONAA >60 06/21/2017   GFRAA >60 06/21/2017  CBC at [facility] on 01/23/2023-Hemoban 16.6, hematocrit 51.4%, MCV 87.9, WBC 4.6, platelets 207, ANC 2.7 Ferritin 16.5 (10.5-3 of 7.3) Medications: I have reviewed the patient's current medications.   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  This occurred after a liposculpture procedure.  He continues apixaban anticoagulation.  There is no clinical symptom of recurrent venous or arterial thrombosis.  He saw Dr. [name] on 01/23/2023.  It is unclear whether the robotic events were provoked or related to an underlying hypercoagulation syndrome and atherosclerotic vascular disease.  A PNH and JAK2 screen were negative.  He has iron deficiency with no source for blood loss identified.  He is scheduled for upper and lower endoscopic procedures in 2 weeks. Dr. [name] recommends continuing long-term anticoagulation with apixaban at the current dose with daily aspirin-81 mg, or full dose apixaban.  [name] reports his cardiologist does not favor continuing aspirin while on apixaban.  He is receiving testosterone therapy via an alternative medicine clinic.  I recommended he lower the testosterone dose so that his testosterone level is in the low normal range.  He plans to follow-up with his cardiologist with regard to the case for adjuvant therapy.  He will continue apixaban at the current  dose.  He will hold apixaban prior to the colonoscopy/upper endoscopy in 2 weeks.  I will defer further evaluation for source of blood loss to [name].  He will return for an office visit, CBC, and ferritin level in 2-3  months.  Recommendations: Continue apixaban anticoagulation, can hold surrounding the endoscopic procedures Aspirin therapy as recommended by Dr. Moll-discuss indication with his cardiologist and neurology Proceed with colonoscopy and upper endoscopy to look for source of blood loss Decrease testosterone dose for goal testosterone level in low normal range Consider expanded myeloproliferative disorder panel if polycythemia or another myeloproliferative disorder remain a concern  Cameron Papas, MD  02/12/2023  1:16 PM

## 2023-02-13 NOTE — Telephone Encounter (Signed)
Please see notes below and advise 

## 2023-02-16 ENCOUNTER — Ambulatory Visit (AMBULATORY_SURGERY_CENTER): Payer: PRIVATE HEALTH INSURANCE | Admitting: *Deleted

## 2023-02-16 VITALS — Ht 69.0 in | Wt 155.0 lb

## 2023-02-16 DIAGNOSIS — R1310 Dysphagia, unspecified: Secondary | ICD-10-CM

## 2023-02-16 DIAGNOSIS — D509 Iron deficiency anemia, unspecified: Secondary | ICD-10-CM

## 2023-02-16 DIAGNOSIS — Z86010 Personal history of colonic polyps: Secondary | ICD-10-CM

## 2023-02-16 NOTE — Progress Notes (Signed)
Pt's name and DOB verified at the beginning of the pre-visit.  Pt denies any difficulty with ambulating,sitting, laying down or rolling side to side Gave both [facility] main # and MD on call # prior to instructions.  No egg or soy allergy known to patient  PONV HX.  Pt denies having issues being intubated Pt has no issues moving head neck, issues with dysphdia per pt No FH of Malignant Hyperthermia Pt is not on diet pills Pt is not on home 02  Pt is not on blood thinners  Pt denies issues with constipation  Pt is not on dialysis Pt denise any abnormal heart rhythms  Pt denies any upcoming cardiac testing Pt encouraged to use to use Singlecare or Goodrx to reduce cost  Patient's chart reviewed by Cathlyn Parsons CNRA prior to pre-visit and patient appropriate for the [facility].  Pre-visit completed and red dot placed by patient's name on their procedure day (on provider's schedule).  . Visit by phone Pt states weight is 155 lb Instructed pt why it is important to and  to call if they have any changes in health or new medications. Directed them to the # given and on instructions.   Pt states they will.  Instructions reviewed with pt and pt states understanding. Instructed to review again prior to procedure. Pt states they will.  Instructions sent by mail and by my chart HIPAA verified and pt stated wife could help answer questions with him. HX of Stroke March 13th 2024 . Pt stated at beginning of call he has some speech issues.

## 2023-02-19 NOTE — Telephone Encounter (Signed)
Inbound call from patient returning phone call. Requesting a call back. Please advise, thank you.  

## 2023-02-19 NOTE — Telephone Encounter (Signed)
Pt was made aware of the clearance from Dr. Truett Perna   Eliquis /2 days prior to the procedure. Pt verbalized understanding with all questions answered.

## 2023-02-19 NOTE — Telephone Encounter (Signed)
Left message for pt to call back  °

## 2023-02-24 ENCOUNTER — Encounter: Payer: Self-pay | Admitting: Certified Registered Nurse Anesthetist

## 2023-02-27 NOTE — Progress Notes (Signed)
[facility] History and Physical   Primary Care Physician:  Creola Corn, MD   Reason for Procedure:   Iron deficiency anemia, hx colon polyps  Plan:    EGD and colonoscopy     HPI: Cameron Hudson, Cameron Hudson is a 57 y.o. 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 This occurred after a liposculpture procedure   2004 12 mm TV adenoma 2007, 2012 no polyps 08/01/2019 2 diminutive polyps Past Medical History:  Diagnosis Date   Allergy    Anemia    Asthma    CAD S/P percutaneous coronary angioplasty 06/20/2017   Inferior STEMI - 100% dRCA --> DES PCI with Synergy 3.0 x 24 (3.3 mm). Mild 10-30% disease in p-mRCA, Cx & LAD.  EF Normal.    Hemorrhoids    Hx of adenomatous polyp of colon 08/01/2019   Hyperlipidemia    IBS (irritable bowel syndrome)    Personal history of colonic polyps 2004   12 mm adenoma   Seasonal allergies    STEMI involving oth coronary artery of inferior wall (HCC) 06/20/2017   12/18 PCI/DESx1 to dRCA, normal EF   Stroke [facility])     Past Surgical History:  Procedure Laterality Date   CHOLECYSTECTOMY N/A 11/28/2019   Procedure: LAPAROSCOPIC CHOLECYSTECTOMY;  Surgeon: Abigail Miyamoto, MD;  Location: [facility];  Service: General;  Laterality: N/A;   COLONOSCOPY     COLONOSCOPY W/ POLYPECTOMY  08/27/2002; 08/30/2005; 01/06/2011   12mm adenoma, diverticulosis, hemorrhoids; normal;  diverticulosis and small hemorrhoids   CORONARY STENT INTERVENTION N/A 06/20/2017   Procedure: CORONARY STENT INTERVENTION;  Surgeon: Lennette Bihari, MD;  Location: [facility] INVASIVE CV [facility];  Service: Cardiovascular;  Laterality: N/A;   LEFT HEART CATH AND CORONARY ANGIOGRAPHY N/A 06/20/2017   Procedure: LEFT HEART CATH AND CORONARY ANGIOGRAPHY;  Surgeon: Lennette Bihari, MD;  Location: [facility] INVASIVE CV LAB;  Service: Cardiovascular;  Laterality: N/A;    LIPOSUCTION     NASAL SINUS SURGERY  05/2010   POLYPECTOMY      Prior to Admission medications   Medication Sig Start Date End Date Taking? Authorizing Provider  apixaban (ELIQUIS) 5 MG TABS tablet Take 5 mg by mouth 2 (two) times daily.    [provider]  ezetimibe (ZETIA) 10 MG tablet Take 1 tablet by mouth once daily 11/19/18   Chilton Si, MD  losartan (COZAAR) 50 MG tablet Take 50 mg by mouth daily.    [provider]  Multiple Vitamin (MULTI-VITAMIN) tablet Take 1 tablet by mouth daily.    [provider]  testosterone cypionate (DEPOTESTOSTERONE CYPIONATE) 200 MG/ML injection Inject 100 mg into the skin 2 (two) times a week. 10/18/22   [provider]    Current Outpatient Medications  Medication Sig Dispense Refill   apixaban (ELIQUIS) 5 MG TABS tablet Take 5 mg by mouth 2 (two) times daily.     ezetimibe (ZETIA) 10 MG tablet Take 1 tablet by mouth once daily 90 tablet 0   losartan (COZAAR) 50 MG tablet Take 50 mg by mouth daily.     Multiple Vitamin (MULTI-VITAMIN) tablet Take 1 tablet by mouth daily.     testosterone cypionate (DEPOTESTOSTERONE CYPIONATE) 200 MG/ML injection Inject 100 mg into the skin 2 (two) times a week.     Current Facility-Administered Medications  Medication Dose Route Frequency Provider Last Rate Last Admin  0.9 %  sodium chloride infusion  500 mL Intravenous Continuous Iva Boop, MD        Allergies as of 02/28/2023 - Review Complete 02/28/2023  Allergen Reaction Noted   Sulfasalazine Rash 10/07/2010   Advair diskus [fluticasone-salmeterol] Other (See Comments) 10/07/2010   Evolocumab Other (See Comments) 12/14/2022   Fluticasone-salmeterol Other (See Comments) 12/14/2022   Latex Other (See Comments) 12/14/2022   Salmeterol xinafoate  03/12/2020   Statins Other (See Comments) 10/07/2010   Adhesive [tape] Rash 11/21/2019   Sulfa antibiotics Rash 10/07/2010    Family History  Problem Relation Age  of Onset   Allergies Father    Arrhythmia Father        cardiologist not worried   Arrhythmia Mother        not clear if a fib or not   Stroke Mother    Colon cancer Neg Hx    Colon polyps Neg Hx    Esophageal cancer Neg Hx    Rectal cancer Neg Hx    Stomach cancer Neg Hx     Social History   Socioeconomic History   Marital status: Married    Spouse name: Not on file   Number of children: Not on file   Years of education: Not on file   Highest education level: Not on file  Occupational History   Occupation: Orthodontist  Tobacco Use   Smoking status: Never   Smokeless tobacco: Never  Vaping Use   Vaping status: Never Used  Substance and Sexual Activity   Alcohol use: Yes    Alcohol/week: 1.0 standard drink of alcohol    Types: 1 Glasses of wine per week    Comment: 1 to 2 drinks per month   Drug use: No   Sexual activity: Not on file  Other Topics Concern   Not on file  Social History Narrative   Not on file   Social Determinants of Health   Financial Resource Strain: Low Risk  (10/04/2022)   Overall Financial Resource Strain (CARDIA)    Difficulty of Paying Living Expenses: Not hard at all  Food Insecurity: No Food Insecurity (10/04/2022)   Hunger Vital Sign    Worried About Running Out of Food in the Last Year: Never true    Ran Out of Food in the Last Year: Never true  Transportation Needs: No Transportation Needs (10/04/2022)   PRAPARE - Administrator, Civil Service (Medical): No    Lack of Transportation (Non-Medical): No  Physical Activity: Sufficiently Active (10/04/2022)   Exercise Vital Sign    Days of Exercise per Week: 3 days    Minutes of Exercise per Session: 60 min  Stress: No Stress Concern Present (10/04/2022)   [employer] of Occupational Health - Occupational Stress Questionnaire    Feeling of Stress : Not at all  Social Connections: Socially Integrated (10/04/2022)   Social Connection and Isolation Panel [NHANES]     Frequency of Communication with Friends and Family: Twice a week    Frequency of Social Gatherings with Friends and Family: Twice a week    Attends Religious Services: 1 to 4 times per year    Active Member of [employer] or Organizations: Yes    Attends Banker Meetings: 1 to 4 times per year    Marital Status: Married  Catering manager Violence: Not At Risk (10/04/2022)   Humiliation, Afraid, Rape, and Kick questionnaire    Fear of Current or Ex-Partner: No  Emotionally Abused: No    Physically Abused: No    Sexually Abused: No    Review of Systems:  All other review of systems negative except as mentioned in the HPI.  Physical Exam: Vital signs BP (!) 149/77   Pulse 70   Temp (!) 97.1 F (36.2 C)   Ht 5\' 9"  (1.753 m)   Wt 155 lb (70.3 kg)   SpO2 95%   BMI 22.89 kg/m   General:   Alert,  Well-developed, well-nourished, pleasant and cooperative in NAD Lungs:  Clear throughout to auscultation.   Heart:  Regular rate and rhythm; no murmurs, clicks, rubs,  or gallops. Abdomen:  Soft, nontender and nondistended. Normal bowel sounds.   Neuro/Psych:  Alert and cooperative. Normal mood and affect. A and O x 3   @Wlliam Grosso  Sena Slate, MD, [facility] [phone] (pager) 02/28/2023 2:20 PM@

## 2023-02-28 ENCOUNTER — Ambulatory Visit (AMBULATORY_SURGERY_CENTER): Payer: No Typology Code available for payment source | Admitting: Internal Medicine

## 2023-02-28 ENCOUNTER — Encounter: Payer: Self-pay | Admitting: Internal Medicine

## 2023-02-28 VITALS — BP 112/69 | HR 72 | Temp 97.1°F | Resp 18 | Ht 69.0 in | Wt 155.0 lb

## 2023-02-28 DIAGNOSIS — K2950 Unspecified chronic gastritis without bleeding: Secondary | ICD-10-CM

## 2023-02-28 DIAGNOSIS — Z86010 Personal history of colonic polyps: Secondary | ICD-10-CM

## 2023-02-28 DIAGNOSIS — D509 Iron deficiency anemia, unspecified: Secondary | ICD-10-CM | POA: Diagnosis not present

## 2023-02-28 DIAGNOSIS — Z09 Encounter for follow-up examination after completed treatment for conditions other than malignant neoplasm: Secondary | ICD-10-CM | POA: Diagnosis present

## 2023-02-28 DIAGNOSIS — D125 Benign neoplasm of sigmoid colon: Secondary | ICD-10-CM

## 2023-02-28 MED ORDER — SODIUM CHLORIDE 0.9 % IV SOLN: 500.0000 mL | INTRAVENOUS | Status: DC

## 2023-02-28 NOTE — Progress Notes (Signed)
Pt's states no medical or surgical changes since previsit or office visit. 

## 2023-02-28 NOTE — Progress Notes (Signed)
1425 Robinul 0.1 mg IV given due large amount of secretions upon assessment.  MD made aware, vss  ?

## 2023-02-28 NOTE — Op Note (Addendum)
[facility] Patient Name: Cameron Hudson Procedure Date: 02/28/2023 2:28 PM MRN: [id number] Endoscopist: Iva Boop , MD, [id number] Age: 56 Referring MD:  Date of Birth: Jul 03, 1965 Gender: Male Account #: [id number] Procedure:                Upper GI endoscopy Indications:              Iron deficiency anemia Medicines:                Monitored Anesthesia Care Procedure:                Pre-Anesthesia Assessment:                           - Prior to the procedure, a History and Physical                            was performed, and patient medications and                            allergies were reviewed. The patient's tolerance of                            previous anesthesia was also reviewed. The risks                            and benefits of the procedure and the sedation                            options and risks were discussed with the patient.                            All questions were answered, and informed consent                            was obtained. Prior Anticoagulants: The patient                            last took Eliquis (apixaban) 2 days prior to the                            procedure. ASA Grade Assessment: III - A patient                            with severe systemic disease. After reviewing the                            risks and benefits, the patient was deemed in                            satisfactory condition to undergo the procedure.                           After obtaining informed consent, the endoscope was  passed under direct vision. Throughout the                            procedure, the patient's blood pressure, pulse, and                            oxygen saturations were monitored continuously. The                            Olympus Scope [id number] was introduced through the                            mouth, and advanced to the second part of duodenum.                            The upper GI  endoscopy was accomplished without                            difficulty. The patient tolerated the procedure                            well. Scope In: Scope Out: Findings:                 Patchy mildly erythematous mucosa without bleeding                            was found in the gastric antrum. Biopsies were                            taken with a cold forceps for histology.                            Verification of patient identification for the                            specimen was done. Estimated blood loss was minimal.                           The exam was otherwise without abnormality.                           The cardia and gastric fundus were normal on                            retroflexion.                           Biopsies for histology were taken with a cold                            forceps in the entire duodenum for evaluation of                            celiac disease. Verification of patient  identification for the specimen was done. Estimated                            blood loss was minimal. Complications:            No immediate complications. Estimated Blood Loss:     Estimated blood loss was minimal. Impression:               - Erythematous mucosa in the antrum. Biopsied.                           - The examination was otherwise normal.                           - Biopsies were taken with a cold forceps for                            evaluation of celiac disease. Recommendation:           - Patient has a contact number available for                            emergencies. The signs and symptoms of potential                            delayed complications were discussed with the                            patient. Return to normal activities tomorrow.                            Written discharge instructions were provided to the                            patient.                           - Resume previous diet.                            - Continue present medications.                           - See the other procedure note for documentation of                            additional recommendations. He has been a regular                            blood donor over the years about 3 times a year so                            ? if that is cause or a component of the iron                            deficiency.  Iva Boop, MD 02/28/2023 3:00:24 PM This report has been signed electronically.

## 2023-02-28 NOTE — Op Note (Addendum)
[facility] Patient Name: Cameron Hudson Procedure Date: 02/28/2023 2:27 PM MRN: [id number] Endoscopist: Iva Boop , MD, [id number] Age: 57 Referring MD:  Date of Birth: 11-02-1965 Gender: Male Account #: [id number] Procedure:                Colonoscopy Indications:              Iron deficiency anemia Medicines:                Monitored Anesthesia Care Procedure:                Pre-Anesthesia Assessment:                           - Prior to the procedure, a History and Physical                            was performed, and patient medications and                            allergies were reviewed. The patient's tolerance of                            previous anesthesia was also reviewed. The risks                            and benefits of the procedure and the sedation                            options and risks were discussed with the patient.                            All questions were answered, and informed consent                            was obtained. Prior Anticoagulants: The patient                            last took Eliquis (apixaban) 2 days prior to the                            procedure. ASA Grade Assessment: III - A patient                            with severe systemic disease. After reviewing the                            risks and benefits, the patient was deemed in                            satisfactory condition to undergo the procedure.                           After obtaining informed consent, the colonoscope  was passed under direct vision. Throughout the                            procedure, the patient's blood pressure, pulse, and                            oxygen saturations were monitored continuously. The                            Olympus Scope [id number] was introduced through the                            anus and advanced to the the cecum, identified by                            appendiceal orifice and  ileocecal valve. The                            colonoscopy was performed without difficulty. The                            patient tolerated the procedure well. The quality                            of the bowel preparation was good. The ileocecal                            valve, appendiceal orifice, and rectum were                            photographed. Scope In: 2:38:18 PM Scope Out: 2:49:29 PM Scope Withdrawal Time: 0 hours 8 minutes 12 seconds  Total Procedure Duration: 0 hours 11 minutes 11 seconds  Findings:                 The perianal and digital rectal examinations were                            normal. Pertinent negatives include normal prostate                            (size, shape, and consistency).                           A diminutive polyp was found in the sigmoid colon.                            The polyp was sessile. The polyp was removed with a                            cold snare. Resection and retrieval were complete.                            Verification of patient identification for the  specimen was done. Estimated blood loss was minimal.                           Multiple diverticula were found in the sigmoid                            colon.                           External and internal hemorrhoids were found. The                            hemorrhoids were small.                           The exam was otherwise without abnormality on                            direct and retroflexion views. Complications:            No immediate complications. Estimated Blood Loss:     Estimated blood loss was minimal. Impression:               - One diminutive polyp in the sigmoid colon,                            removed with a cold snare. Resected and retrieved.                           - Diverticulosis in the sigmoid colon.                           - External and internal hemorrhoids.                           - The examination  was otherwise normal on direct                            and retroflexion views.                           - Personal history of colonic polyps. 2004 12 mm TV                            adenoma                           2007, 2012 no polyps                           08/01/2019 2 diminutive polyps - adenomas Recommendation:           - Patient has a contact number available for                            emergencies. The signs and symptoms of potential  delayed complications were discussed with the                            patient. Return to normal activities tomorrow.                            Written discharge instructions were provided to the                            patient.                           - Await pathology results.                           - See the other procedure note for documentation of                            additional recommendations.                           - Resume Eliquis (apixaban) at prior dose tomorrow.                           - Repeat colonoscopy is recommended for                            surveillance. The colonoscopy date will be                            determined after pathology results from today's                            exam become available for review. No cause of iron                            deficiency here. He has been a regular blood donor                            over the years about 3 times a year so ? if that is                            cause or a component of the iron deficiency. Iva Boop, MD 02/28/2023 3:05:21 PM This report has been signed electronically.

## 2023-02-28 NOTE — Progress Notes (Signed)
Called to room to assist during endoscopic procedure.  Patient ID and intended procedure confirmed with present staff. Received instructions for my participation in the procedure from the performing physician.  

## 2023-02-28 NOTE — Progress Notes (Signed)
Report given to PACU, vss 

## 2023-02-28 NOTE — Patient Instructions (Addendum)
Upper endoscopy exam looked ok - possible mild gastritis - biopsies taken. Also checked for celiac disease with biopsies.  One diminutive colon polyp removed, small hemorrhoids (often swell w/ prep) and diverticulosis seen.  I will contact with results and recommendations.  Restart Eliquis tomorrow.  I appreciate the opportunity to care for you. Iva Boop, MD, [facility]  Handouts provided on gastritis, polyps, diverticulosis and hemorrhoids.  Resume previous diet.  Continue present medications.  Resume Eliquis (apixaban) at prior dose tomorrow.  Await pathology results.  Repeat colonoscopy is recommended for surveillance. The colonoscopy date will be determined after patholgoy results from today's exam become available for review.   YOU HAD AN ENDOSCOPIC PROCEDURE TODAY AT THE [facility]:   Refer to the procedure report that was given to you for any specific questions about what was found during the examination.  If the procedure report does not answer your questions, please call your gastroenterologist to clarify.  If you requested that your care partner not be given the details of your procedure findings, then the procedure report has been included in a sealed envelope for you to review at your convenience later.  YOU SHOULD EXPECT: Some feelings of bloating in the abdomen. Passage of more gas than usual.  Walking can help get rid of the air that was put into your GI tract during the procedure and reduce the bloating. If you had a lower endoscopy (such as a colonoscopy or flexible sigmoidoscopy) you may notice spotting of blood in your stool or on the toilet paper. If you underwent a bowel prep for your procedure, you may not have a normal bowel movement for a few days.  Please Note:  You might notice some irritation and congestion in your nose or some drainage.  This is from the oxygen used during your procedure.  There is no need for concern and it should clear up in a day or  so.  SYMPTOMS TO REPORT IMMEDIATELY:  Following lower endoscopy (colonoscopy or flexible sigmoidoscopy):  Excessive amounts of blood in the stool  Significant tenderness or worsening of abdominal pains  Swelling of the abdomen that is new, acute  Fever of 100F or higher  Following upper endoscopy (EGD)  Vomiting of blood or coffee ground material  New chest pain or pain under the shoulder blades  Painful or persistently difficult swallowing  New shortness of breath  Fever of 100F or higher  Black, tarry-looking stools  For urgent or emergent issues, a gastroenterologist can be reached at any hour by calling [phone]. Do not use MyChart messaging for urgent concerns.    DIET:  We do recommend a small meal at first, but then you may proceed to your regular diet.  Drink plenty of fluids but you should avoid alcoholic beverages for 24 hours.  ACTIVITY:  You should plan to take it easy for the rest of today and you should NOT DRIVE or use heavy machinery until tomorrow (because of the sedation medicines used during the test).    FOLLOW UP: Our staff will call the number listed on your records the next business day following your procedure.  We will call around 7:15- 8:00 am to check on you and address any questions or concerns that you may have regarding the information given to you following your procedure. If we do not reach you, we will leave a message.     If any biopsies were taken you will be contacted by phone or by letter  within the next 1-3 weeks.  Please call us at [phone] if you have not heard about the biopsies in 3 weeks.    SIGNATURES/CONFIDENTIALITY: You and/or your care partner have signed paperwork which will be entered into your electronic medical record.  These signatures attest to the fact that that the information above on your After Visit Summary has been reviewed and is understood.  Full responsibility of the confidentiality of this discharge  information lies with you and/or your care-partner.

## 2023-03-01 ENCOUNTER — Telehealth: Payer: Self-pay | Admitting: *Deleted

## 2023-03-01 NOTE — Telephone Encounter (Signed)
Attempted f/u phone call. No answer. Left message. °

## 2023-03-11 ENCOUNTER — Encounter: Payer: Self-pay | Admitting: Internal Medicine

## 2023-03-22 ENCOUNTER — Encounter: Payer: Self-pay | Admitting: Internal Medicine

## 2023-04-30 ENCOUNTER — Other Ambulatory Visit: Payer: No Typology Code available for payment source

## 2023-04-30 ENCOUNTER — Ambulatory Visit: Payer: No Typology Code available for payment source | Admitting: Oncology

## 2024-01-16 ENCOUNTER — Encounter (HOSPITAL_COMMUNITY): Payer: Self-pay

## 2024-01-16 ENCOUNTER — Emergency Department (HOSPITAL_COMMUNITY): Payer: PRIVATE HEALTH INSURANCE

## 2024-01-16 ENCOUNTER — Other Ambulatory Visit: Payer: Self-pay

## 2024-01-16 ENCOUNTER — Emergency Department (HOSPITAL_COMMUNITY)
Admission: EM | Admit: 2024-01-16 | Discharge: 2024-01-16 | Disposition: A | Discharge status: Home / Self Care | Payer: PRIVATE HEALTH INSURANCE | Attending: Emergency Medicine | Admitting: Emergency Medicine

## 2024-01-16 DIAGNOSIS — Z91040 Latex allergy status: Secondary | ICD-10-CM | POA: Insufficient documentation

## 2024-01-16 DIAGNOSIS — Z8673 Personal history of transient ischemic attack (TIA), and cerebral infarction without residual deficits: Secondary | ICD-10-CM | POA: Diagnosis not present

## 2024-01-16 DIAGNOSIS — R569 Unspecified convulsions: Secondary | ICD-10-CM | POA: Insufficient documentation

## 2024-01-16 DIAGNOSIS — Z7901 Long term (current) use of anticoagulants: Secondary | ICD-10-CM | POA: Insufficient documentation

## 2024-01-16 DIAGNOSIS — I2510 Atherosclerotic heart disease of native coronary artery without angina pectoris: Secondary | ICD-10-CM | POA: Insufficient documentation

## 2024-01-16 LAB — COMPREHENSIVE METABOLIC PANEL WITH GFR
ALT: 24 U/L (ref 0–44)
AST: 36 U/L (ref 15–41)
Albumin: 3.9 g/dL (ref 3.5–5.0)
Alkaline Phosphatase: 45 U/L (ref 38–126)
Anion gap: 18 — ABNORMAL HIGH (ref 5–15)
BUN: 25 mg/dL — ABNORMAL HIGH (ref 6–20)
CO2: 19 mmol/L — ABNORMAL LOW (ref 22–32)
Calcium: 9 mg/dL (ref 8.9–10.3)
Chloride: 101 mmol/L (ref 98–111)
Creatinine, Ser: 1.46 mg/dL — ABNORMAL HIGH (ref 0.61–1.24)
GFR, Estimated: 56 mL/min — ABNORMAL LOW (ref >60)
Glucose, Bld: 174 mg/dL — ABNORMAL HIGH (ref 70–99)
Potassium: 3.6 mmol/L (ref 3.5–5.1)
Sodium: 138 mmol/L (ref 135–145)
Total Bilirubin: 0.9 mg/dL (ref 0.0–1.2)
Total Protein: 6.4 g/dL — ABNORMAL LOW (ref 6.5–8.1)

## 2024-01-16 LAB — URINALYSIS, W/ REFLEX TO CULTURE (INFECTION SUSPECTED)
Bacteria, UA: NONE SEEN
Bilirubin Urine: NEGATIVE
Glucose, UA: >=500 mg/dL — AB
Hgb urine dipstick: NEGATIVE
Ketones, ur: 5 mg/dL — AB
Leukocytes,Ua: NEGATIVE
Nitrite: NEGATIVE
Protein, ur: 30 mg/dL — AB
Specific Gravity, Urine: 1.018 (ref 1.005–1.030)
pH: 5 (ref 5.0–8.0)

## 2024-01-16 LAB — CBC WITH DIFFERENTIAL/PLATELET
Abs Immature Granulocytes: 0.03 K/uL (ref 0.00–0.07)
Basophils Absolute: 0 K/uL (ref 0.0–0.1)
Basophils Relative: 1 %
Eosinophils Absolute: 0.2 K/uL (ref 0.0–0.5)
Eosinophils Relative: 3 %
HCT: 46.3 % (ref 39.0–52.0)
Hemoglobin: 16.1 g/dL (ref 13.0–17.0)
Immature Granulocytes: 0 %
Lymphocytes Relative: 38 %
Lymphs Abs: 2.6 K/uL (ref 0.7–4.0)
MCH: 31.1 pg (ref 26.0–34.0)
MCHC: 34.8 g/dL (ref 30.0–36.0)
MCV: 89.6 fL (ref 80.0–100.0)
Monocytes Absolute: 0.5 K/uL (ref 0.1–1.0)
Monocytes Relative: 8 %
Neutro Abs: 3.4 K/uL (ref 1.7–7.7)
Neutrophils Relative %: 50 %
Platelets: 222 K/uL (ref 150–400)
RBC: 5.17 MIL/uL (ref 4.22–5.81)
RDW: 13.2 % (ref 11.5–15.5)
WBC: 6.8 K/uL (ref 4.0–10.5)
nRBC: 0 % (ref 0.0–0.2)

## 2024-01-16 MED ORDER — LEVETIRACETAM 500 MG PO TABS: 500.0000 mg | ORAL_TABLET | Freq: Two times a day (BID) | ORAL | 0 refills | Status: DC

## 2024-01-16 NOTE — Discharge Instructions (Addendum)
 Our neurologist has recommended that you start Keppra .  We had discussions about this, I do recommend you at least fill this and have it on hand, especially if traveling or going out of town. I placed an ambulatory referral to neurology.  They should be contacting you to arrange an appointment.  If you do not hear from them in the next few days, please follow-up with the office about this. No driving until cleared by neurology to do so.  This is [place]  state law. Please return here for any new or acute changes.

## 2024-01-16 NOTE — ED Triage Notes (Signed)
 Bibgcems from home, wife noted patient having full body convulsions for one minute along with slow respirations. Snoring respirations noted upon ems arrival. Pt alert oriented and normal respiration. Hx stroke and MI  Bp 140/78 Hr 94 95% ra  rr 26 Cbg 109

## 2024-01-16 NOTE — ED Provider Notes (Signed)
 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   The history is provided by the patient and medical records.  Seizures  58 year old male with history of hyperlipidemia, coronary artery disease, history of PE on Eliquis, prior stroke, presenting to the ED after seizure.  Patient and wife were getting ready for bed when all of a sudden he began having about 1 minute of full body tonic-clonic seizure activity.  EMS was called.  He was postictal upon their arrival with snoring respirations but is since returned to baseline.  Patient states he is not entirely sure what happened but woke up in the ambulance on the way here.  He does have history of prior stroke but is never has seizures previously.  He denies any new medications or changes in usual regimen.  He has not had any fever or other recent illness.  Prior to Admission medications   Medication Sig Start Date End Date Taking? Authorizing Provider  levETIRAcetam  (KEPPRA ) 500 MG tablet Take 1 tablet (500 mg total) by mouth 2 (two) times daily. 01/16/24  Yes Jarold Olam HERO, PA-C  apixaban (ELIQUIS) 5 MG TABS tablet Take 5 mg by mouth 2 (two) times daily.    [provider]  ezetimibe  (ZETIA ) 10 MG tablet Take 1 tablet by mouth once daily 11/19/18   Raford Riggs, MD  losartan (COZAAR) 50 MG tablet Take 50 mg by mouth daily.    [provider]  Multiple Vitamin (MULTI-VITAMIN) tablet Take 1 tablet by mouth daily.    [provider]  testosterone cypionate (DEPOTESTOSTERONE CYPIONATE) 200 MG/ML injection Inject 100 mg into the skin 2 (two) times a week. 10/18/22   [provider]    Allergies: Sulfasalazine, Advair diskus [fluticasone-salmeterol], Evolocumab, Fluticasone-salmeterol, Latex, Salmeterol xinafoate, Statins, Adhesive [tape], and Sulfa  antibiotics    Review of Systems  Neurological:  Positive for seizures.  All other systems reviewed and are negative.   Updated Vital Signs BP 132/80   Pulse 87   Temp 97.6 F (36.4 C)   Resp 19   Ht 5' 8 (1.727 m)   Wt 68 kg   SpO2 97%   BMI 22.81 kg/m   Physical Exam Vitals and nursing note reviewed.  Constitutional:      Appearance: He is well-developed.  HENT:     Head: Normocephalic and atraumatic.     Mouth/Throat:     Comments: No tongue or dental injury noted Eyes:     Conjunctiva/sclera: Conjunctivae normal.     Pupils: Pupils are equal, round, and reactive to light.  Cardiovascular:     Rate and Rhythm: Normal rate and regular rhythm.     Heart sounds: Normal heart sounds.  Pulmonary:     Effort: Pulmonary effort is normal.     Breath sounds: Normal breath sounds.  Abdominal:     General: Bowel sounds are normal.     Palpations: Abdomen is soft.  Musculoskeletal:        General: Normal range of motion.     Cervical back: Normal range of motion.  Skin:    General: Skin is warm and dry.  Neurological:     Mental Status: He is alert and oriented to person, place, and time.     Comments: Awake/alert, a little slow to respond but answering questions  and follow commands when prompted, no focal deficits     (all labs ordered are listed, but only abnormal results are displayed) Labs Reviewed  COMPREHENSIVE METABOLIC PANEL WITH GFR - Abnormal; Notable for the following components:      Result Value   CO2 19 (*)    Glucose, Bld 174 (*)    BUN 25 (*)    Creatinine, Ser 1.46 (*)    Total Protein 6.4 (*)    GFR, Estimated 56 (*)    Anion gap 18 (*)    All other components within normal limits  URINALYSIS, W/ REFLEX TO CULTURE (INFECTION SUSPECTED) - Abnormal; Notable for the following components:   APPearance HAZY (*)    Glucose, UA >=500 (*)    Ketones, ur 5 (*)    Protein, ur 30 (*)    All other components within normal limits  CBC WITH  DIFFERENTIAL/PLATELET    EKG: EKG Interpretation Date/Time:  Wednesday January 16 2024 01:07:04 EDT Ventricular Rate:  88 PR Interval:  163 QRS Duration:  97 QT Interval:  355 QTC Calculation: 430 R Axis:   54  Text Interpretation: Sinus rhythm Borderline T wave abnormalities Confirmed by Griselda Norris [phone]) on 01/16/2024 1:25:17 AM  Radiology: CT Head Wo Contrast Result Date: 01/16/2024 EXAM: CT HEAD AND CERVICAL SPINE 01/16/2024 02:42:05 AM TECHNIQUE: CT of the head and cervical spine was performed without the administration of intravenous contrast. Multiplanar reformatted images are provided for review. Automated exposure control, iterative reconstruction, and/or weight based adjustment of the mA/kV was utilized to reduce the radiation dose to as low as reasonably achievable. COMPARISON: None available. CLINICAL HISTORY: Seizure, new-onset, history of trauma; on eliquis. FINDINGS: CT HEAD BRAIN AND VENTRICLES: No acute intracranial hemorrhage. No mass effect or midline shift. No abnormal extra-axial fluid collection. Gray-white differentiation is maintained. No hydrocephalus. ORBITS: No acute abnormality. SINUSES AND MASTOIDS: No acute abnormality. SOFT TISSUES AND SKULL: No acute skull fracture. No acute soft tissue abnormality. CT CERVICAL SPINE BONES AND ALIGNMENT: No acute fracture or traumatic malalignment. DEGENERATIVE CHANGES: No significant degenerative changes. SOFT TISSUES: No prevertebral soft tissue swelling. IMPRESSION: 1. No acute intracranial abnormality. 2. No acute fracture or traumatic malalignment of the cervical spine. Electronically signed by: Franky Stanford MD 01/16/2024 02:50 AM EDT RP Workstation: [id number]   CT Cervical Spine Wo Contrast Result Date: 01/16/2024 EXAM: CT HEAD AND CERVICAL SPINE 01/16/2024 02:42:05 AM TECHNIQUE: CT of the head and cervical spine was performed without the administration of intravenous contrast. Multiplanar reformatted images are provided  for review. Automated exposure control, iterative reconstruction, and/or weight based adjustment of the mA/kV was utilized to reduce the radiation dose to as low as reasonably achievable. COMPARISON: None available. CLINICAL HISTORY: Seizure, new-onset, history of trauma; on eliquis. FINDINGS: CT HEAD BRAIN AND VENTRICLES: No acute intracranial hemorrhage. No mass effect or midline shift. No abnormal extra-axial fluid collection. Gray-white differentiation is maintained. No hydrocephalus. ORBITS: No acute abnormality. SINUSES AND MASTOIDS: No acute abnormality. SOFT TISSUES AND SKULL: No acute skull fracture. No acute soft tissue abnormality. CT CERVICAL SPINE BONES AND ALIGNMENT: No acute fracture or traumatic malalignment. DEGENERATIVE CHANGES: No significant degenerative changes. SOFT TISSUES: No prevertebral soft tissue swelling. IMPRESSION: 1. No acute intracranial abnormality. 2. No acute fracture or traumatic malalignment of the cervical spine. Electronically signed by: Franky Stanford MD 01/16/2024 02:50 AM EDT RP Workstation: [id number]     Procedures   Medications Ordered in the ED - No data to display  Medical Decision Making Amount and/or Complexity of Data Reviewed Labs: ordered. Radiology: ordered and independent interpretation performed. ECG/medicine tests: ordered and independent interpretation performed.  Risk Prescription drug management.   58 y.o. M here after witnessed seizure at home while getting ready for bed.  No prior hx of same.  Has had prior stroke and currently on eliquis for hx of PE.  He is drowsy on arrival but able to answer questions and follow commands here.  Denies any aura prior to onset of seizure.  Given he is anticoagulated, will obtain CT head/neck, EKG, labs.    CT head/neck negative for acute findings.  Labs without leukocytosis.  SrCr 1.46, above baseline.  BUN 25 so may be somewhat dry.  Does admit he was out in the  heat today.  Discussed with neurology, Dr. Voncile-- given prior stroke, recommends 2g keppra  IV load and then 500mg  BID PO.    Patient has fully returned to baseline.  States he feels well.  Vitals are stable.  I have discussed neurology's recommendations with him, he is very hesitant to start antiepileptics.  He feels like episode today was stress-induced as he had quite a challenging day.  He is agreeable to outpatient follow-up with EEG.  I have sent a prescription for Keppra  to the pharmacy, states he will start this if any ongoing issues.  They are planning to go out of town later this week so I have recommended they at least fill this and take with them (even if not willing to start medication now) since they are planning to travel.  We have discussed driving precautions until cleared by neurology.  He will return here for any new/acute changes.  Final diagnoses:  Seizure [facility])    ED Discharge Orders          Ordered    levETIRAcetam  (KEPPRA ) 500 MG tablet  2 times daily        01/16/24 0454    Ambulatory referral to Neurology       Comments: An appointment is requested in approximately: 4 weeks   01/16/24 0455               Jarold Olam HERO, PA-C 01/16/24 0510    Griselda Norris, MD 01/16/24 [phone]

## 2024-01-22 ENCOUNTER — Encounter: Payer: Self-pay | Admitting: Neurology

## 2024-02-04 ENCOUNTER — Ambulatory Visit (INDEPENDENT_AMBULATORY_CARE_PROVIDER_SITE_OTHER): Payer: PRIVATE HEALTH INSURANCE | Admitting: Neurology

## 2024-02-04 DIAGNOSIS — R569 Unspecified convulsions: Secondary | ICD-10-CM | POA: Diagnosis not present

## 2024-02-04 NOTE — Progress Notes (Signed)
 EEG complete and ready for review.

## 2024-02-05 NOTE — Procedures (Signed)
 ELECTROENCEPHALOGRAM REPORT  Date of Study: 02/04/2024  Patient's Name: Cameron Hudson, DDS MRN: [id number] Date of Birth: Jan 28, 1966  Referring Provider: Dr. Norleen Jungling  Clinical History: This is a 58 year old man with history of left MCA stroke in 08/2023 with new onset seizure. EEG for classification.  Medications: Keppra , Eliquis, Cozaar, Zetia   Technical Summary: A multichannel digital EEG recording measured by the international 10-20 system with electrodes applied with paste and impedances below 5000 ohms performed in our laboratory with EKG monitoring in an awake and drowsy patient.  Hyperventilation was not performed. Photic stimulation was performed.  The digital EEG was referentially recorded, reformatted, and digitally filtered in a variety of bipolar and referential montages for optimal display.    Description: The patient is awake and drowsy during the recording.  During maximal wakefulness, there is a symmetric, medium voltage 9-10 Hz posterior dominant rhythm that attenuates with eye opening.  There is occasional focal 4-5 Hz theta slowing over the left temporal region. During drowsiness, there is an increase in theta slowing of the background.  Sleep was not captured. Photic stimulation did not elicit any abnormalities.  There were no epileptiform discharges or electrographic seizures seen.    EKG lead was unremarkable.  Impression: This awake and drowsy EEG is abnormal due to occasional focal slowing over the left temporal region.  Clinical Correlation of the above findings indicates focal cerebral dysfunction over the left temporal region suggestive of underlying structural or physiologic abnormality. The absence of epileptiform discharges does not exclude a clinical diagnosis of epilepsy. Clinical correlation is advised.    Darice Shivers, M.D.

## 2024-02-06 ENCOUNTER — Telehealth: Payer: Self-pay | Admitting: Neurology

## 2024-02-06 NOTE — Telephone Encounter (Signed)
 Hi Cameron Hudson! patient's PCP contacted me about this patient and he had an EEG yesterday. I think he needs to be seen earlier than his 9/19 appt me. Ok to put on [facility]'s 8/25 new patient slot for new onset seizure. Thank you!   LMOM  to get him sooner  please see Dr Georjean message

## 2024-02-18 ENCOUNTER — Ambulatory Visit: Payer: PRIVATE HEALTH INSURANCE | Admitting: Neurology

## 2024-02-19 ENCOUNTER — Encounter: Payer: Self-pay | Admitting: Neurology

## 2024-02-19 ENCOUNTER — Ambulatory Visit (INDEPENDENT_AMBULATORY_CARE_PROVIDER_SITE_OTHER): Payer: PRIVATE HEALTH INSURANCE | Admitting: Neurology

## 2024-02-19 VITALS — BP 150/90 | HR 81 | Resp 18 | Ht 68.0 in | Wt 156.0 lb

## 2024-02-19 DIAGNOSIS — R569 Unspecified convulsions: Secondary | ICD-10-CM | POA: Diagnosis not present

## 2024-02-19 MED ORDER — LEVETIRACETAM 500 MG PO TABS: 500.0000 mg | ORAL_TABLET | Freq: Two times a day (BID) | ORAL | 3 refills | Status: DC

## 2024-02-19 NOTE — Progress Notes (Signed)
 NEUROLOGY CONSULTATION NOTE  Cameron Hudson, DDS MRN: [id number] DOB: Feb 12, 1966  Referring provider: Olam Slocumb, PA-C Primary care provider: Dr. Norleen Jungling  Reason for consult:  new onset seizure  Dear Dr Jungling:  Thank you for your kind referral of Cameron Hudson, DDS for consultation of the above symptoms. Although his history is well known to you, please allow me to reiterate it for the purpose of our medical record. The patient was accompanied to the clinic by wife who also provides collateral information. Records and images were personally reviewed where available.   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 Records were reviewed. He has no recollection of events, waking up in the ER. 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 His wife woke up to him shaking on his left side followed by sonorous respirations for 10 minutes. He bit the left side of his tongue. He was post-ictal when EMS arrived. CBC, CMP unremarkable except for creatinine 1.46. I personally reviewed head CT without contrast, no acute changes. 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 He was discharged home on Levetiracetam  500mg  BID but was hesitant to start medication. He had an EEG earlier this month which showed occasional focal slowing over the left temporal region.  He denies any staring/unresponsive episodes, gaps in time, olfactory/gustatory hallucinations, deja vu, rising epigastric sensation, focal numbness/tingling/weakness, myoclonic jerks. He denies any headaches, dizziness, diplopia, dysarthria/dysphagia, bowel/bladder dysfunction. He has neck and back  pain. He gets 7 hours of sleep, no sleep deprivation prior to the seizure. No alcohol use.   With the stroke in 08/2022, had had a procedure (liposuction) 3 days prior to the stroke. He had right-sided weakness and aphasia that progressed to loss of consciousness. He was brought to [facility] in [place], [place] where he had a thrombectomy and took Eliquis for 6 months. He is now on aspirin  every other day for secondary stroke prevention (easy bruising). He lost 30% arm strength and has noticed decreased temperature sensation, he does not sweat. He has word-finding difficulties. Memory is good. Mood is good, depends on how much sleep he gets. He has been working with [facility] in [facility] (neuroplasticity) with good effects. He lives with his wife and child.   Epilepsy Risk Factors:  L MCA stroke s/p thrombectomy with encephalomalacia in left temporal parietal region. He had a normal birth and early development.  There is no history of febrile convulsions, CNS infections such as meningitis/encephalitis, significant traumatic brain injury, or family history of seizures.   PAST MEDICAL HISTORY: Past Medical History:  Diagnosis Date   Allergy    Anemia    Asthma    CAD S/P percutaneous coronary angioplasty 06/20/2017   Inferior STEMI - 100% dRCA --> DES PCI with Synergy 3.0 x 24 (3.3 mm). Mild 10-30% disease in p-mRCA, Cx & LAD.  EF Normal.    Hemorrhoids    Hx of adenomatous polyp of colon 08/01/2019   Hyperlipidemia    IBS (irritable bowel syndrome)    Personal history of colonic polyps 2004   12 mm adenoma   Seasonal allergies    STEMI involving  oth coronary artery of inferior wall (HCC) 06/20/2017   12/18 PCI/DESx1 to dRCA, normal EF   Stroke [facility])     PAST SURGICAL HISTORY: Past Surgical History:  Procedure Laterality Date   CHOLECYSTECTOMY N/A 11/28/2019   Procedure: LAPAROSCOPIC CHOLECYSTECTOMY;  Surgeon: Vernetta Berg, MD;  Location: [facility];   Service: General;  Laterality: N/A;   COLONOSCOPY     COLONOSCOPY W/ POLYPECTOMY  08/27/2002; 08/30/2005; 01/06/2011   12mm adenoma, diverticulosis, hemorrhoids; normal;  diverticulosis and small hemorrhoids   CORONARY STENT INTERVENTION N/A 06/20/2017   Procedure: CORONARY STENT INTERVENTION;  Surgeon: Burnard Debby LABOR, MD;  Location: [facility];  Service: Cardiovascular;  Laterality: N/A;   LEFT HEART CATH AND CORONARY ANGIOGRAPHY N/A 06/20/2017   Procedure: LEFT HEART CATH AND CORONARY ANGIOGRAPHY;  Surgeon: Burnard Debby LABOR, MD;  Location: [facility];  Service: Cardiovascular;  Laterality: N/A;   LIPOSUCTION     NASAL SINUS SURGERY  05/2010   POLYPECTOMY      MEDICATIONS: Current Outpatient Medications on File Prior to Visit  Medication Sig Dispense Refill   aspirin  EC 81 MG tablet Take 81 mg by mouth daily. Swallow whole. (Patient taking differently: Take 81 mg by mouth daily. Taken aod)     ezetimibe  (ZETIA ) 10 MG tablet Take 1 tablet by mouth once daily 90 tablet 0   losartan (COZAAR) 50 MG tablet Take 50 mg by mouth daily.     Multiple Vitamin (MULTI-VITAMIN) tablet Take 1 tablet by mouth daily.     testosterone cypionate (DEPOTESTOSTERONE CYPIONATE) 200 MG/ML injection Inject 100 mg into the skin 2 (two) times a week.     apixaban (ELIQUIS) 5 MG TABS tablet Take 5 mg by mouth 2 (two) times daily.     levETIRAcetam  (KEPPRA ) 500 MG tablet Take 1 tablet (500 mg total) by mouth 2 (two) times daily. 60 tablet 0   No current facility-administered medications on file prior to visit.    ALLERGIES: Allergies  Allergen Reactions   Sulfasalazine Rash   Advair Diskus [Fluticasone-Salmeterol] Other (See Comments)    Increased BP   Evolocumab Other (See Comments)    Swelling   Fluticasone-Salmeterol Other (See Comments)   Latex Other (See Comments)   Salmeterol Xinafoate    Statins Other (See Comments)    Muscle pain   Adhesive [Tape] Rash    After a few days   Sulfa  Antibiotics Rash    FAMILY HISTORY: Family History  Problem Relation Age of Onset   Allergies Father    Arrhythmia Father        cardiologist not worried   Arrhythmia Mother        not clear if a fib or not   Stroke Mother    Colon cancer Neg Hx    Colon polyps Neg Hx    Esophageal cancer Neg Hx    Rectal cancer Neg Hx    Stomach cancer Neg Hx     SOCIAL HISTORY: Social History   Socioeconomic History   Marital status: Married    Spouse name: Not on file   Number of children: Not on file   Years of education: Not on file   Highest education level: Not on file  Occupational History   Occupation: Orthodontist  Tobacco Use   Smoking status: Never   Smokeless tobacco: Never  Vaping Use   Vaping status: Never Used  Substance and Sexual Activity   Alcohol use: Yes    Alcohol/week:  1.0 standard drink of alcohol    Types: 1 Glasses of wine per week    Comment: 1 to 2 drinks per month   Drug use: No   Sexual activity: Not on file  Other Topics Concern   Not on file  Social History Narrative   Not on file   Social Drivers of Health   Financial Resource Strain: Low Risk  (10/04/2022)   Overall Financial Resource Strain (CARDIA)    Difficulty of Paying Living Expenses: Not hard at all  Food Insecurity: No Food Insecurity (10/04/2022)   Hunger Vital Sign    Worried About Running Out of Food in the Last Year: Never true    Ran Out of Food in the Last Year: Never true  Transportation Needs: No Transportation Needs (10/04/2022)   PRAPARE - Administrator, Civil Service (Medical): No    Lack of Transportation (Non-Medical): No  Physical Activity: Sufficiently Active (10/04/2022)   Exercise Vital Sign    Days of Exercise per Week: 3 days    Minutes of Exercise per Session: 60 min  Stress: No Stress Concern Present (10/04/2022)   [employer] of Occupational Health - Occupational Stress Questionnaire    Feeling of Stress : Not at all  Social Connections:  Socially Integrated (10/04/2022)   Social Connection and Isolation Panel    Frequency of Communication with Friends and Family: Twice a week    Frequency of Social Gatherings with Friends and Family: Twice a week    Attends Religious Services: 1 to 4 times per year    Active Member of [employer] or Organizations: Yes    Attends Banker Meetings: 1 to 4 times per year    Marital Status: Married  Catering manager Violence: Not At Risk (10/04/2022)   Humiliation, Afraid, Rape, and Kick questionnaire    Fear of Current or Ex-Partner: No    Emotionally Abused: No    Physically Abused: No    Sexually Abused: No     PHYSICAL EXAM: Vitals:   02/19/24 1029  BP: (!) 162/101  Pulse: 81  Resp: 18  SpO2: 98%   General: No acute distress Head:  Normocephalic/atraumatic Skin/Extremities: No rash, no edema Neurological Exam: Mental status: alert and oriented to person, place, and time, no dysarthria, mild expressive aphasia with word-finding difficulties, difficulty with repetition. Able to name. Fund of knowledge is appropriate.  Recent and remote memory are intact, 3/3 delayed recall. Attention and concentration are normal, 5/5 WORLD backwards. Cranial nerves: CN I: not tested CN II: pupils equal, round, visual fields intact CN III, IV, VI:  full range of motion, no nystagmus, no ptosis CN V: decreased cold on right V1-2, decreased pin on right V2 CN VII: shallow right nasolabial fold with symmetric smile CN VIII: hearing intact to conversation Bulk & Tone: normal, no fasciculations. Motor: 5/5 throughout with no pronator drift. Sensation: decreased cold on right upper and lower extremity, decreased pin on right UE, intact vibration sense. Romberg test negative Deep Tendon Reflexes: +1 throughout Cerebellar: no incoordination on finger to nose testing Gait: narrow-based and steady, able to tandem walk adequately. 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 EEG showed left temporal slowing. MRI brain with and without contrast will be  ordered to assess for any new changes, however seizure is likely secondary to prior stroke. We discussed that remote symptomatic seizures have a recurrence rate of 72% within 10 years, because of this high recurrence risk, a single remote unprovoked seizure qualifies as epilepsy. Discussed recommendation to start seizure medication, we discussed side effects of Levetiracetam , he is agreeable to start 500mg  BID. [place] driving laws were discussed with the patient, and he knows to stop driving after a seizure, until 6 months seizure-free. BP today elevated, continue to monitor and control vascular risk factors with PCP, continue aspirin  for secondary stroke prevention. All their questions were answered to the best of my abilities. Follow-up in 3 months, call for any changes.    Thank you for allowing me to participate in the care of this patient. Please do not hesitate to call for any questions or concerns.   Darice Shivers, M.D.  CC: Dr. Onita

## 2024-02-19 NOTE — Patient Instructions (Addendum)
 Good to meet you.  Schedule MRI brain with and without contrast at [facility] [phone]  2. Start Levetiracetam  (Keppra ) 500mg : take 1 tablet twice a day  3. Follow-up in 3 months, call for any changes    Seizure Precautions: 1. If medication has been prescribed for you to prevent seizures, take it exactly as directed.  Do not stop taking the medicine without talking to your doctor first, even if you have not had a seizure in a long time.   2. Avoid activities in which a seizure would cause danger to yourself or to others.  Don't operate dangerous machinery, swim alone, or climb in high or dangerous places, such as on ladders, roofs, or girders.  Do not drive unless your doctor says you may.  3. If you have any warning that you may have a seizure, lay down in a safe place where you can't hurt yourself.    4.  No driving for 6 months from last seizure, as per [place]  state law.   Please refer to the following link on the Epilepsy Foundation of America's website for more information: http://www.epilepsyfoundation.org/answerplace/Social/driving/drivingu.cfm   5.  Maintain good sleep hygiene. Avoid alcohol.  6.  Contact your doctor if you have any problems that may be related to the medicine you are taking.  7.  Call 911 and bring the patient back to the ED if:        A.  The seizure lasts longer than 5 minutes.       B.  The patient doesn't awaken shortly after the seizure  C.  The patient has new problems such as difficulty seeing, speaking or moving  D.  The patient was injured during the seizure  E.  The patient has a temperature over 102 F (39C)  F.  The patient vomited and now is having trouble breathing

## 2024-02-25 ENCOUNTER — Encounter: Payer: Self-pay | Admitting: Neurology

## 2024-03-03 ENCOUNTER — Encounter: Payer: Self-pay | Admitting: Neurology

## 2024-03-04 ENCOUNTER — Other Ambulatory Visit: Payer: PRIVATE HEALTH INSURANCE

## 2024-03-13 ENCOUNTER — Ambulatory Visit
Admission: RE | Admit: 2024-03-13 | Discharge: 2024-03-13 | Disposition: A | Discharge status: Home / Self Care | Payer: PRIVATE HEALTH INSURANCE | Source: Ambulatory Visit | Attending: Neurology | Admitting: Neurology

## 2024-03-13 MED ORDER — GADOPICLENOL 0.5 MMOL/ML IV SOLN
7.0000 mL | Freq: Once | INTRAVENOUS | Status: AC | PRN
  Administered 2024-03-13: 7 mL via INTRAVENOUS

## 2024-03-14 ENCOUNTER — Ambulatory Visit: Payer: PRIVATE HEALTH INSURANCE | Admitting: Neurology

## 2024-03-18 ENCOUNTER — Ambulatory Visit: Payer: Self-pay | Admitting: Neurology

## 2024-03-19 NOTE — Telephone Encounter (Signed)
 Pt is returning  a call about getting the results  please call

## 2024-04-22 ENCOUNTER — Telehealth: Payer: Self-pay | Admitting: Neurology

## 2024-04-22 NOTE — Telephone Encounter (Signed)
 Misty called and LM with AN. She needs information regarding a disability claim  [phone] [place]

## 2024-04-22 NOTE — Telephone Encounter (Signed)
 Pt called no answer left a voice mail to call the office back

## 2024-04-23 NOTE — Telephone Encounter (Signed)
 Pt called no answer left a voice mail to call the office back

## 2024-04-23 NOTE — Telephone Encounter (Signed)
 Pt called in he is going to have Misty send in forms to have paperwork for disability faxed to her.

## 2024-05-14 ENCOUNTER — Ambulatory Visit (INDEPENDENT_AMBULATORY_CARE_PROVIDER_SITE_OTHER): Payer: PRIVATE HEALTH INSURANCE | Admitting: Neurology

## 2024-05-14 ENCOUNTER — Encounter: Payer: Self-pay | Admitting: Neurology

## 2024-05-14 VITALS — BP 122/77 | HR 86 | Ht 68.0 in | Wt 156.0 lb

## 2024-05-14 DIAGNOSIS — R569 Unspecified convulsions: Secondary | ICD-10-CM

## 2024-05-14 DIAGNOSIS — Z8673 Personal history of transient ischemic attack (TIA), and cerebral infarction without residual deficits: Secondary | ICD-10-CM

## 2024-05-14 MED ORDER — LEVETIRACETAM 500 MG PO TABS: 500.0000 mg | ORAL_TABLET | Freq: Two times a day (BID) | ORAL | 3 refills | Status: AC

## 2024-05-14 NOTE — Patient Instructions (Signed)
 Good to see you.  Continue Levetiracetam  (Keppra ) 500mg  twice a day  2. You can start a daily vitamin B6 100mg  supplement which can help with mood changes associated with Keppra . Continue to monitor mood, let me know if you would like to change medication  3. Follow-up in 6 months or earlier if needed, call for any changes   Seizure Precautions: 1. If medication has been prescribed for you to prevent seizures, take it exactly as directed.  Do not stop taking the medicine without talking to your doctor first, even if you have not had a seizure in a long time.   2. Avoid activities in which a seizure would cause danger to yourself or to others.  Don't operate dangerous machinery, swim alone, or climb in high or dangerous places, such as on ladders, roofs, or girders.  Do not drive unless your doctor says you may.  3. If you have any warning that you may have a seizure, lay down in a safe place where you can't hurt yourself.    4.  No driving for 6 months from last seizure, as per [place]  state law.   Please refer to the following link on the Epilepsy Foundation of America's website for more information: http://www.epilepsyfoundation.org/answerplace/Social/driving/drivingu.cfm   5.  Maintain good sleep hygiene. Avoid alcohol.  6.  Contact your doctor if you have any problems that may be related to the medicine you are taking.  7.  Call 911 and bring the patient back to the ED if:        A.  The seizure lasts longer than 5 minutes.       B.  The patient doesn't awaken shortly after the seizure  C.  The patient has new problems such as difficulty seeing, speaking or moving  D.  The patient was injured during the seizure  E.  The patient has a temperature over 102 F (39C)  F.  The patient vomited and now is having trouble breathing

## 2024-05-14 NOTE — Progress Notes (Signed)
 NEUROLOGY FOLLOW UP OFFICE NOTE  Cameron Hudson, DDS [id number] August 25, 1965   Discussed the use of AI scribe software for clinical note transcription with the patient, who gave verbal consent to proceed.  HISTORY OF PRESENT ILLNESS: I had the pleasure of seeing Cameron Hudson in follow-up in the neurology clinic on 05/14/2024.  The patient was last seen 3 months ago for new onset nocturnal seizure on 01/16/24 likely secondary to old stroke. He is again accompanied by his wife who helps supplement the history today.  Records and images were personally reviewed where available.  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 He was started on Levetiracetam  500mg  BID on last visit.  He denies any seizures since 01/16/2024. He denies any staring/unresponsive episodes, gaps in time, olfactory/gustatory hallucinations, focal numbness/tingling/weakness, myoclonic jerks. No headaches, dizziness, vision changes, no falls. He experiences mood-related side effects from Keppra , including increased irritability and an episode of yelling, which was uncharacteristic for him. These mood changes have somewhat stabilized but are still present, especially when he does not get enough sleep. He averages about seven hours of sleep per night.   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 Records were reviewed. He has no recollection of events, waking up in the ER. 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 His wife woke up to him shaking on his left side followed by sonorous respirations for 10 minutes. He bit the left side of his tongue. He was post-ictal when EMS arrived. CBC, CMP unremarkable except for creatinine 1.46. I personally reviewed head CT without contrast, no acute changes. 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 He was discharged home on Levetiracetam  500mg  BID but was hesitant to start medication. He had an EEG earlier this month which showed occasional focal slowing over the left temporal region.  He denies any staring/unresponsive episodes, gaps in time, olfactory/gustatory hallucinations, deja vu, rising epigastric sensation, focal numbness/tingling/weakness, myoclonic jerks. He denies any headaches, dizziness, diplopia, dysarthria/dysphagia, bowel/bladder dysfunction. He has neck and back pain. He gets 7 hours of sleep, no sleep deprivation prior to the seizure. No alcohol use.   With the stroke in 08/2022, had had a procedure (liposuction) 3 days prior to the stroke. He had right-sided weakness and aphasia that progressed to loss of consciousness. He was brought to [facility] in [place], [place] where he had a thrombectomy and took Eliquis for 6 months. He is now on aspirin  every other day for secondary stroke prevention (easy bruising). He lost 30% arm strength and has noticed decreased temperature sensation, he does not sweat. He has word-finding difficulties. Memory is good. Mood is good, depends on how much sleep he gets. He has been working with [facility] in [facility] (neuroplasticity) with good effects. He lives with his wife and child.   Epilepsy Risk Factors:  L MCA stroke s/p thrombectomy with encephalomalacia in left temporal parietal region. He  had a normal birth and early development.  There is no history of febrile convulsions, CNS infections such as  meningitis/encephalitis, significant traumatic brain injury, or family history of seizures.  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  PAST MEDICAL HISTORY: Past Medical History:  Diagnosis Date   Allergy    Anemia    Asthma    CAD S/P percutaneous coronary angioplasty 06/20/2017   Inferior STEMI - 100% dRCA --> DES PCI with Synergy 3.0 x 24 (3.3 mm). Mild 10-30% disease in p-mRCA, Cx & LAD.  EF Normal.    Hemorrhoids    Hx of adenomatous polyp of colon 08/01/2019   Hyperlipidemia    IBS (irritable bowel syndrome)    Personal history of colonic polyps 2004   12 mm adenoma   Seasonal allergies    STEMI involving oth coronary artery of inferior wall (HCC) 06/20/2017   12/18 PCI/DESx1 to dRCA, normal EF   Stroke [facility])     MEDICATIONS: Current Outpatient Medications on File Prior to Visit  Medication Sig Dispense Refill   aspirin  EC 81 MG tablet Take 81 mg by mouth daily. Swallow whole. (Patient taking differently: Take 81 mg by mouth daily. Taken aod)     ezetimibe  (ZETIA ) 10 MG tablet Take 1 tablet by mouth once daily 90 tablet 0   levETIRAcetam  (KEPPRA ) 500 MG tablet Take 1 tablet (500 mg total) by mouth 2 (two) times daily. 180 tablet 3   losartan (COZAAR) 50 MG tablet Take 50 mg by mouth daily.     Multiple Vitamin (MULTI-VITAMIN) tablet Take 1 tablet by mouth daily.     testosterone cypionate (DEPOTESTOSTERONE CYPIONATE) 200 MG/ML injection Inject 100 mg into the skin 2 (two) times a week.     No current facility-administered medications on file prior to visit.    ALLERGIES: Allergies  Allergen Reactions   Sulfasalazine Rash   Advair Diskus [Fluticasone-Salmeterol] Other (See Comments)     Increased BP   Evolocumab Other (See Comments)    Swelling   Fluticasone-Salmeterol Other (See Comments)   Latex Other (See Comments)   Salmeterol Xinafoate    Statins Other (See Comments)    Muscle pain   Adhesive [Tape] Rash    After a few days   Sulfa Antibiotics Rash    FAMILY HISTORY: Family History  Problem Relation Age of Onset   Allergies Father    Arrhythmia Father        cardiologist not worried   Arrhythmia Mother        not clear if a fib or not   Stroke Mother    Colon cancer Neg Hx    Colon polyps Neg Hx    Esophageal cancer Neg Hx    Rectal cancer Neg Hx    Stomach cancer Neg Hx     SOCIAL HISTORY: Social History   Socioeconomic History   Marital status: Married    Spouse name: Not on file   Number of children: 4   Years of education: 25   Highest education level: Not on file  Occupational History   Occupation: Orthodontist  Tobacco Use   Smoking status: Never   Smokeless tobacco: Never  Vaping Use   Vaping status: Never Used  Substance and Sexual Activity  Alcohol use: Yes    Alcohol/week: 1.0 standard drink of alcohol    Types: 1 Glasses of wine per week    Comment: 1 to 2 drinks per month   Drug use: No   Sexual activity: Not on file  Other Topics Concern   Not on file  Social History Narrative   Left handed   Drinks coffee   Lives with wife   Retired education officer, community   Two story home   Social Drivers of Health   Financial Resource Strain: Low Risk  (10/04/2022)   Overall Financial Resource Strain (CARDIA)    Difficulty of Paying Living Expenses: Not hard at all  Food Insecurity: No Food Insecurity (10/04/2022)   Hunger Vital Sign    Worried About Running Out of Food in the Last Year: Never true    Ran Out of Food in the Last Year: Never true  Transportation Needs: No Transportation Needs (10/04/2022)   PRAPARE - Administrator, Civil Service (Medical): No    Lack of Transportation (Non-Medical): No  Physical Activity:  Sufficiently Active (10/04/2022)   Exercise Vital Sign    Days of Exercise per Week: 3 days    Minutes of Exercise per Session: 60 min  Stress: No Stress Concern Present (10/04/2022)   [employer] of Occupational Health - Occupational Stress Questionnaire    Feeling of Stress : Not at all  Social Connections: Socially Integrated (10/04/2022)   Social Connection and Isolation Panel    Frequency of Communication with Friends and Family: Twice a week    Frequency of Social Gatherings with Friends and Family: Twice a week    Attends Religious Services: 1 to 4 times per year    Active Member of [employer] or Organizations: Yes    Attends Banker Meetings: 1 to 4 times per year    Marital Status: Married  Catering Manager Violence: Not At Risk (10/04/2022)   Humiliation, Afraid, Rape, and Kick questionnaire    Fear of Current or Ex-Partner: No    Emotionally Abused: No    Physically Abused: No    Sexually Abused: No     PHYSICAL EXAM: Vitals:   05/14/24 1252  BP: 122/77  Pulse: 86  SpO2: 97%   General: No acute distress Head:  Normocephalic/atraumatic Skin/Extremities: No rash, no edema Neurological Exam: alert and awake. No aphasia or dysarthria. Fund of knowledge is appropriate.  Attention and concentration are normal. Occasional expressive aphasia with word-finding difficulties. Cranial nerves: Pupils equal, round. Extraocular movements intact with no nystagmus. Visual fields full.  No facial asymmetry.  Motor: Bulk and tone normal, muscle strength 5/5 throughout with no pronator drift.   Finger to nose testing intact.  Gait narrow-based and steady, able to tandem walk adequately.  Romberg negative.   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 EEG  showed left temporal slowing. MRI brain showed the chronic left MCA stroke. He has been seizure-free since 01/16/24 on Levetiracetam  500mg  BID however having mood disturbance side effects. It has leveled out some, we agreed to try vitamin B6 100mg  supplement which has some benefits for mood changes related to Keppra . We also discussed the option of switching to a different seizure medication if symptoms worsen/affect quality of life. He would like to request for  an exception from 6 month driving restriction, letter provided today. Follow-up in 6 months or earlier if needed, call for any changes.   Thank you for allowing me to participate in his care.  Please do not hesitate to call for any questions or concerns.    Darice Shivers, M.D.   CC: Dr. Onita

## 2024-11-11 ENCOUNTER — Ambulatory Visit: Payer: PRIVATE HEALTH INSURANCE | Admitting: Neurology
